# Patient Record
Sex: Male | Born: 1955 | Race: White | Hispanic: No | Smoking: Never smoker
Health system: Southern US, Community
[De-identification: ages and names within clinical notes are randomized; demographics above are authoritative.]

## PROBLEM LIST (undated history)

## (undated) ENCOUNTER — Emergency Department (HOSPITAL_COMMUNITY): Admission: EM | Payer: BC Managed Care – PPO | Source: Home / Self Care

## (undated) DIAGNOSIS — G8929 Other chronic pain: Secondary | ICD-10-CM

## (undated) DIAGNOSIS — K589 Irritable bowel syndrome without diarrhea: Secondary | ICD-10-CM

## (undated) DIAGNOSIS — M26629 Arthralgia of temporomandibular joint, unspecified side: Secondary | ICD-10-CM

## (undated) DIAGNOSIS — N411 Chronic prostatitis: Secondary | ICD-10-CM

## (undated) DIAGNOSIS — N522 Drug-induced erectile dysfunction: Secondary | ICD-10-CM

## (undated) DIAGNOSIS — N200 Calculus of kidney: Secondary | ICD-10-CM

## (undated) DIAGNOSIS — G4733 Obstructive sleep apnea (adult) (pediatric): Secondary | ICD-10-CM

## (undated) DIAGNOSIS — F1921 Other psychoactive substance dependence, in remission: Secondary | ICD-10-CM

## (undated) DIAGNOSIS — K219 Gastro-esophageal reflux disease without esophagitis: Secondary | ICD-10-CM

## (undated) DIAGNOSIS — I509 Heart failure, unspecified: Secondary | ICD-10-CM

## (undated) DIAGNOSIS — I447 Left bundle-branch block, unspecified: Secondary | ICD-10-CM

## (undated) DIAGNOSIS — G4730 Sleep apnea, unspecified: Secondary | ICD-10-CM

## (undated) DIAGNOSIS — I2510 Atherosclerotic heart disease of native coronary artery without angina pectoris: Secondary | ICD-10-CM

## (undated) DIAGNOSIS — M549 Dorsalgia, unspecified: Secondary | ICD-10-CM

---

## 2011-12-03 ENCOUNTER — Encounter (HOSPITAL_COMMUNITY): Payer: Self-pay | Admitting: *Deleted

## 2011-12-03 ENCOUNTER — Ambulatory Visit (HOSPITAL_COMMUNITY)
Admission: RE | Admit: 2011-12-03 | Discharge: 2011-12-03 | Disposition: A | Discharge status: Home / Self Care | Payer: No Typology Code available for payment source | Source: Ambulatory Visit | Attending: Psychiatry | Admitting: Psychiatry

## 2011-12-03 DIAGNOSIS — M26629 Arthralgia of temporomandibular joint, unspecified side: Secondary | ICD-10-CM

## 2011-12-03 DIAGNOSIS — M549 Dorsalgia, unspecified: Secondary | ICD-10-CM | POA: Diagnosis present

## 2011-12-03 DIAGNOSIS — F1320 Sedative, hypnotic or anxiolytic dependence, uncomplicated: Secondary | ICD-10-CM | POA: Insufficient documentation

## 2011-12-03 DIAGNOSIS — G8929 Other chronic pain: Secondary | ICD-10-CM | POA: Diagnosis present

## 2011-12-03 DIAGNOSIS — N522 Drug-induced erectile dysfunction: Secondary | ICD-10-CM

## 2011-12-03 DIAGNOSIS — G4730 Sleep apnea, unspecified: Secondary | ICD-10-CM

## 2011-12-03 DIAGNOSIS — N529 Male erectile dysfunction, unspecified: Secondary | ICD-10-CM | POA: Insufficient documentation

## 2011-12-03 DIAGNOSIS — F1120 Opioid dependence, uncomplicated: Secondary | ICD-10-CM | POA: Insufficient documentation

## 2011-12-03 DIAGNOSIS — F411 Generalized anxiety disorder: Secondary | ICD-10-CM | POA: Insufficient documentation

## 2011-12-03 DIAGNOSIS — F39 Unspecified mood [affective] disorder: Secondary | ICD-10-CM | POA: Insufficient documentation

## 2011-12-03 DIAGNOSIS — M26609 Unspecified temporomandibular joint disorder, unspecified side: Secondary | ICD-10-CM | POA: Insufficient documentation

## 2011-12-03 HISTORY — DX: Sleep apnea, unspecified: G47.30

## 2011-12-03 HISTORY — DX: Arthralgia of temporomandibular joint, unspecified side: M26.629

## 2011-12-03 HISTORY — DX: Other chronic pain: G89.29

## 2011-12-03 HISTORY — DX: Dorsalgia, unspecified: M54.9

## 2011-12-03 HISTORY — DX: Drug-induced erectile dysfunction: N52.2

## 2011-12-03 NOTE — BH Assessment (Signed)
Assessment Note   Alejandro Ramos is a 56 y.o. married white male.  He presents at the recommendation of the [place] [facility], from which he was discharged on 12/01/2011.  Pt was admitted to this facility for a 45 day residential rehab program after a 25 day inpatient program at [facility], where he received detox and rehab treatment.  Pt reports that he has been taking 100 mg of Ambien daily for the past 1.5 years.  In the last 2 months before treatment he was also taking 2 tabs of Vicodin daily.  Both medications were obtained on the Internet.  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  Pt attributes his relapse in part to ongoing conflict with his spouse; he characterizes their relationship as co-dependent.  He notes that despite an extensive history of participation in 12-step meetings, he stopped because she pressured him to do so.  He also reports that in 07/2011 an uncle to whom he was very close perished, and that his father is currently very ill and is expected to die soon.  Pt reports many years of problems with depression (see "risk to self" assessment below) and anxiety.  He reports that his sleep has been diminished since stopping the medications about 60 days ago.  He has panic attacks when he lays down, and only sleeps 4 - 5 hours, with terminal insomnia.  He denies SI at this time and has never made a suicide attempt.  He reports that 30 years ago he experienced SI and wrote a suicide note, but did not actually harm himself.  He denies HI, violent behavior, legal problems, or hallucinations, and exhibits no delusional thought.  He is interested in enrolling in CD-IOP program.  Pt and his wife and children live in the [place], [place] area, but he is currently living with his sister's family in [place] as he completes his rehabilitation.  He will be returning to his own home thereafter.  He  is a retired Publishing copy.  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 Back pain, chronic 12/03/2011  . Sleep apnea 12/03/2011    Has a C-PAP machine, but does not use it.  . 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 Back pain, chronic 12/03/2011  . Sleep apnea 12/03/2011    Has a C-PAP machine, but does not use it.  . 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 Drug-induced erectile dysfunction 12/03/2011    Induce by antidepressants    No past surgical history on file.  Family History: No family history on file.  Social History:  does not have a smoking history on file. He does not have any smokeless tobacco history on file. He reports that he uses illicit drugs (Hydrocodone and Other-see comments). His alcohol history not on file.  Additional Social History:  Alcohol / Drug Use Pain Medications: Vicodin (obtained via Internet) Prescriptions: Denies Over the Counter: Denies Substance #1 Name of Substance 1: Ambien (obtained via Internet) 1 - Age of First Use: 56 y/o 1 - Amount (  size/oz): 100 mg 1 - Frequency: Daily 1 - Duration: 1.5 years 1 - Last Use / Amount: 60 days ago Substance #2 Name of Substance 2:  Vicodin (obtained via Internet) 2 - Age of First Use: 20's 2 - Amount (size/oz): 2 tabs 2 - Frequency: daily 2 - Duration: 2 months 2 - Last Use / Amount: 60 days ago  CIWA:   COWS:    Allergies: Allergies no known allergies  Home Medications:  (Not in a hospital admission)  OB/GYN Status:  No LMP for male patient.  General Assessment Data Location of Assessment:  [facility] Living Arrangements: Spouse/significant other;Children (Children ages 56, 6, & 41 y/o; currently staying w/ sister.) Can pt return to current living arrangement?: Yes Admission Status: Voluntary Is patient capable of signing voluntary admission?: Yes Transfer from: Home Referral Source: Self/Family/Friend  Education Status Is patient currently in school?: No Current Grade: Holds a graduate degree  Risk to self Suicidal Ideation: No Suicidal Intent: No Is patient at risk for suicide?: No Suicidal Plan?: No Access to Means: No What has been your use of drugs/alcohol within the last 12 months?: Recently detoxed from Ambien & Vicodin Previous Attempts/Gestures: No How many times?: 0  Other Self Harm Risks: History of SI 30 yrs ago; wrote note, no gesture. Triggers for Past Attempts: Other (Comment) (Not applicable) Intentional Self Injurious Behavior: None Family Suicide History: No (Hx of alcoholism in family.) Recent stressful life event(s): Loss (Comment) (Spouse pressured him to discontinue 12-step; Father very ill) Persecutory voices/beliefs?: No Depression: Yes Depression Symptoms: Insomnia;Isolating;Fatigue;Guilt;Loss of interest in usual pleasures;Feeling angry/irritable Substance abuse history and/or treatment for substance abuse?: Yes (Recent Tx of abusing Ambien & Vicodin) Suicide prevention information given to non-admitted patients: Yes  Risk to Others Homicidal Ideation: No Thoughts of Harm to Others: No Current Homicidal Intent: No Current Homicidal Plan: No Access to Homicidal Means: No Identified Victim: None History of harm to others?: No Assessment of Violence: None Noted Violent Behavior Description: Calm, cooperative Does patient have access to weapons?: No (Denies having access to firearms) Criminal Charges Pending?: No Does patient have a court date: No  Psychosis Hallucinations: None noted Delusions: None noted  Mental  Status Report Appear/Hygiene: Other (Comment) (Neat, well groomed) Eye Contact: Fair Motor Activity: Unremarkable Speech: Other (Comment) (Unremarkable) Level of Consciousness: Alert Mood: Anxious;Depressed Affect: Appropriate to circumstance Anxiety Level: Moderate (Daily panic attacks when laying down.) Thought Processes: Coherent;Circumstantial Judgement: Unimpaired Orientation: Person;Place;Time;Situation Obsessive Compulsive Thoughts/Behaviors: Minimal (Repetative behavior)  Cognitive Functioning Concentration: Decreased (When he sleeps poorly) Memory: Recent Intact;Remote Intact IQ: Above Average Insight: Fair Impulse Control: Good Appetite: Fair Weight Loss: 25  (over past 60 days) Weight Gain: 0  Sleep: Decreased Total Hours of Sleep: 4  (Terminal insomnia, x 60 days since stopping Ambien) Vegetative Symptoms: None  ADLScreening [facility] Assessment Services) Patient's cognitive ability adequate to safely complete daily activities?: Yes Patient able to express need for assistance with ADLs?: Yes Independently performs ADLs?: Yes  Abuse/Neglect [facility]) Physical Abuse: Denies Verbal Abuse: Denies Sexual Abuse: Denies  Prior Inpatient Therapy Prior Inpatient Therapy: Yes Prior Therapy Dates: 08/2011 - 09/2011: [facility]; 25 day detox/rehab program. Prior Therapy Facilty/Provider(s): 09/2011 - 12/01/2011: [facility] [place] [facility]; 45 day residential rehab Reason for Treatment: 5 years ago: [facility]; detox/rehab  Prior Outpatient Therapy Prior Outpatient Therapy: Yes Prior Therapy Dates: 5-6 years ago w/ restart today: [name], [place] - outpatient counseling Prior Therapy Facilty/Provider(s): Intake scheduled for  12/29/2011 w/ Rupinder  [name], MD Reason for Treatment: Extensive history of 12-step participation.  ADL Screening (condition at time of admission) Patient's cognitive ability adequate to safely complete daily activities?: Yes Patient  able to express need for assistance with ADLs?: Yes Independently performs ADLs?: Yes Weakness of Legs: None Weakness of Arms/Hands: None  Home Assistive Devices/Equipment Home Assistive Devices/Equipment: CPAP;Eyeglasses (Oral insert for TMJ; Does not use CPAP)    Abuse/Neglect Assessment (Assessment to be complete while patient is alone) Physical Abuse: Denies Verbal Abuse: Denies Sexual Abuse: Denies Exploitation of patient/patient's resources: Denies Self-Neglect: Denies Values / Beliefs Cultural Requests During Hospitalization:  (Extensive history of 12-step participation.)   Advance Directives (For Healthcare) Advance Directive: Patient does not have advance directive;Patient would not like information Pre-existing out of facility DNR order (yellow form or pink MOST form): No Nutrition Screen Diet: Regular Unintentional weight loss greater than 10lbs within the last month: Yes (Comment) (Reports 25# weight loss in the past 60 days) Problems chewing or swallowing foods and/or liquids: No Home Tube Feeding or Total Parenteral Nutrition (TPN): No Patient appears severely malnourished: No  Additional Information 1:1 In Past 12 Months?: No CIRT Risk: No Elopement Risk: No Does patient have medical clearance?: No     Disposition:  Disposition Disposition of Patient: Outpatient treatment Type of outpatient treatment: Chemical Dependence - Intensive Outpatient Discussed pt with Jorje Guild.  He believes that pt would benefit from participation in CD-IOP program, but notes that he will have to discontinue use of Klonopin in order to enroll.  Pt was verbally advised of this, to which he agrees.  He was further advised to go to the local Emergency Dept if he starts to experience withdrawal.  He was given written information about the CD-IOP program, and advised to call Charmian Muff' phone number and leave a message as soon as possible, then await her call back to establish a start date.   He was also advised where the Outpatient Clinic is located, and where to park his car.  He agrees to these plans.  On Site Evaluation by:   Reviewed with Physician:  Jorje Guild, PA @ 16:24   Raphael Gibney 12/03/2011 6:32 PM

## 2011-12-05 ENCOUNTER — Encounter (HOSPITAL_COMMUNITY): Payer: Self-pay | Admitting: Psychology

## 2011-12-05 ENCOUNTER — Other Ambulatory Visit (HOSPITAL_COMMUNITY): Payer: No Typology Code available for payment source | Attending: Psychiatry | Admitting: Psychology

## 2011-12-05 DIAGNOSIS — F1920 Other psychoactive substance dependence, uncomplicated: Secondary | ICD-10-CM | POA: Insufficient documentation
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
## 2011-12-07 ENCOUNTER — Other Ambulatory Visit (HOSPITAL_COMMUNITY): Payer: No Typology Code available for payment source

## 2011-12-07 DIAGNOSIS — F1920 Other psychoactive substance dependence, uncomplicated: Secondary | ICD-10-CM

## 2011-12-08 LAB — PRESCRIPTION ABUSE MONITORING 17P, URINE
Amphetamine/Meth: NEGATIVE ng/mL
Barbiturate Screen, Urine: NEGATIVE ng/mL
Buprenorphine, Urine: NEGATIVE ng/mL
Cannabinoid Scrn, Ur: NEGATIVE ng/mL
Carisoprodol, Urine: NEGATIVE ng/mL
Cocaine Metabolites: NEGATIVE ng/mL
Fentanyl, Ur: NEGATIVE ng/mL
Meperidine, Ur: NEGATIVE ng/mL
Tapentadol, urine: NEGATIVE ng/mL
Tramadol Scrn, Ur: NEGATIVE ng/mL

## 2011-12-09 ENCOUNTER — Other Ambulatory Visit (HOSPITAL_COMMUNITY): Payer: No Typology Code available for payment source | Admitting: Psychology

## 2011-12-09 ENCOUNTER — Encounter (HOSPITAL_COMMUNITY): Payer: Self-pay | Admitting: Psychology

## 2011-12-09 MED ORDER — CHLORDIAZEPOXIDE HCL 25 MG PO CAPS: ORAL_CAPSULE | ORAL | Status: DC
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
## 2011-12-11 NOTE — Progress Notes (Signed)
    Daily Group Progress Note  Program: CD-IOP   Group Time: 1-2:30 pm  Participation Level: None  Behavioral Response: patient was with the medical director for his first meeting  Type of Therapy: Process Group  Topic: Process: first part of group was spent in process. A new member had returned after a 3 session absence. Upon check-in, she disclosed she had not drank since July 30th, but admitted she had smoked marijuana this morning. She insisted that although the alcohol was a problem, she did not feel that the marijuana was a problem and it was very effective in helping her with anxiety and panic attacks. This disclosure invited a lengthy discussion about the problems that result when an addict uses any chemical. A number of other group members shared that they had tried to eliminate some drug use, but continue to use the marijuana, but it never worked for long and they were always back to their old ways within a short time. The session was very effective in allowing members to witness 'denial' and address their own issues around the future and what they will be able to do about their sobriety.  Group Time: 2:45- 4pm  Participation Level: Minimal  Behavioral Response: Sharing  Type of Therapy: Psycho-education Group  Topic: The Wheel of Life/Graduation: the second part of group was spent with members sharing their wheel of life drawing on the board and explaining where they are in each of the 8 categories. Most wheels were extremely uneven and would have led to very bumpy ride. This exercise required that members share about their lives and opened them up to a new level of understanding within the group. As the session neared an end, a graduation ceremony was held for one of the younger members who was moving onto a new level of recovery. members' shared kind words and encouragement to her and she handed out drawings she had made specifically for each member. It was a joyful celebration,  but one that also recognized that her absence will be felt by all.   Summary: The patient missed most of the first part of group because he was meeting with the program director, AW. In the second part he expressed that he is attending meetings at the [facility] and finds them very helpful and encouraging. He continues to struggle with sleep and stated that the medical director is going to help him with his medications. He wished the graduating member success in working her program and a life of sobriety.    Family Program: Family present? No   Name of family member(s):   UDS collected: Yes Results: positive for benzodiazepines, patient had just stopped taking klonipin   AA/NA attended?: YesTuesday, Wednesday, Thursday and Friday  Sponsor?: No   Agam Davenport, LCAS

## 2011-12-12 ENCOUNTER — Other Ambulatory Visit (HOSPITAL_COMMUNITY): Payer: No Typology Code available for payment source | Admitting: Psychology

## 2011-12-12 DIAGNOSIS — F1920 Other psychoactive substance dependence, uncomplicated: Secondary | ICD-10-CM

## 2011-12-12 LAB — BENZODIAZEPINES (GC/LC/MS), URINE
Alprazolam metabolite (GC/LC/MS), ur confirm: NEGATIVE ng/mL
Flurazepam metabolite (GC/LC/MS), ur confirm: NEGATIVE ng/mL
Midazolam (GC/LC/MS), ur confirm: NEGATIVE ng/mL
Oxazepam (GC/LC/MS), ur confirm: NEGATIVE ng/mL
Temazepam (GC/LC/MS), ur confirm: NEGATIVE ng/mL

## 2011-12-13 LAB — PRESCRIPTION ABUSE MONITORING 17P, URINE
Barbiturate Screen, Urine: NEGATIVE ng/mL
Buprenorphine, Urine: NEGATIVE ng/mL
Cannabinoid Scrn, Ur: NEGATIVE ng/mL
Creatinine, Urine: 190.97 mg/dL (ref >20.0)
Fentanyl, Ur: NEGATIVE ng/mL
MDMA URINE: NEGATIVE ng/mL
Meperidine, Ur: NEGATIVE ng/mL
Methadone Screen, Urine: NEGATIVE ng/mL
Propoxyphene: NEGATIVE ng/mL
Tapentadol, urine: NEGATIVE ng/mL

## 2011-12-13 LAB — ALCOHOL METABOLITE (ETG), URINE: Ethyl Glucuronide (EtG): NEGATIVE ng/mL
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
## 2011-12-14 ENCOUNTER — Other Ambulatory Visit (HOSPITAL_COMMUNITY): Payer: No Typology Code available for payment source | Admitting: Psychology

## 2011-12-14 LAB — BENZODIAZEPINES (GC/LC/MS), URINE
Alprazolam metabolite (GC/LC/MS), ur confirm: NEGATIVE ng/mL
Clonazepam metabolite (GC/LC/MS), ur confirm: 83 ng/mL
Estazolam (GC/LC/MS), ur confirm: NEGATIVE ng/mL
Midazolam (GC/LC/MS), ur confirm: NEGATIVE ng/mL
Nordiazepam (GC/LC/MS), ur confirm: NEGATIVE ng/mL
Temazepam (GC/LC/MS), ur confirm: NEGATIVE ng/mL

## 2011-12-15 ENCOUNTER — Other Ambulatory Visit (HOSPITAL_COMMUNITY): Payer: Self-pay | Admitting: *Deleted

## 2011-12-15 MED ORDER — MIRTAZAPINE 30 MG PO TABS: 30.0000 mg | ORAL_TABLET | Freq: Every day | ORAL | Status: DC

## 2011-12-15 MED ORDER — TRAZODONE HCL 100 MG PO TABS: 200.0000 mg | ORAL_TABLET | Freq: Every day | ORAL | Status: DC

## 2011-12-15 MED ORDER — QUETIAPINE FUMARATE 300 MG PO TABS: 300.0000 mg | ORAL_TABLET | Freq: Every day | ORAL | Status: DC

## 2011-12-15 NOTE — Telephone Encounter (Signed)
Seroquel refill authorized by Dr.Kumar. Trazodone increased from 100 mg to 200 mg and Remeron increased from 15 mg to 30 mg by Dr.Kumar.

## 2011-12-16 ENCOUNTER — Other Ambulatory Visit (HOSPITAL_COMMUNITY): Payer: Self-pay | Admitting: Psychiatry

## 2011-12-16 ENCOUNTER — Other Ambulatory Visit (HOSPITAL_COMMUNITY): Payer: No Typology Code available for payment source | Admitting: Psychology

## 2011-12-16 DIAGNOSIS — F39 Unspecified mood [affective] disorder: Secondary | ICD-10-CM

## 2011-12-16 MED ORDER — TRAZODONE HCL 100 MG PO TABS: 200.0000 mg | ORAL_TABLET | Freq: Every day | ORAL | Status: DC

## 2011-12-16 MED ORDER — QUETIAPINE FUMARATE 300 MG PO TABS: 300.0000 mg | ORAL_TABLET | Freq: Every day | ORAL | Status: DC

## 2011-12-16 MED ORDER — MIRTAZAPINE 30 MG PO TABS: 30.0000 mg | ORAL_TABLET | Freq: Every day | ORAL | Status: DC

## 2011-12-18 NOTE — Progress Notes (Signed)
    Daily Group Progress Note  Program: CD-IOP   Group Time: 1-2:30 pm  Participation Level: Active  Behavioral Response: Appropriate  Type of Therapy: Psycho-education Group  Topic: What are Your Mooring Lines? A presentation was provided identifying the essential need to develop mooring lines to keep on anchored and steady in recovery. An analogy to a boat or ship that sets out mooring lines to remain anchored. Without them the boat will drift and possibly encounter danger or damage by drifting aimlessly. Early recovery is the same way. If one doesn't have these support lines in place, the risks of relapse become greater and more likely. Handouts were provided with members identifying their mooring lines. Most had a few, but only 1 or 2 group members have built the support and established these mooring lines to support their early sobriety.   Group Time: 2:45-4pm  Participation Level: Active  Behavioral Response: Sharing and Assertive  Type of Therapy: Process Group  Topic: Process: second half of group was spent in process. Members shared about their difficulties and struggles. One member shared about his situation at home and the growing chaos around his marriage and wife's behaviors. Members listened and provided support, but everyone was clearly frustrated by a bad situation getting worse and the few options available to this member. There was denial observed by one member who discounts her cannabis use while others encouraged their fellow group members to attend meetings and reach out for support.  Summary: The patient had more mooring lines than any other member and noted besides his sponsors and other men in recovery, he has his 2 siblings and is working with a Veterinary surgeon as well as this program. The patient displayed a good understanding of the importance of being accountable to others in early recovery and has many mooring lines to help anchor him. He agreed that his home life is  somewhat like another group member's because their wives don't seem to understand addiction and are not supportive of the 12-step community or connecting with others that are in recovery. The patient is making excellent progress in his recovery, but remains very discombobulated cognitively and agrees he is very raw in his sobriety from Ambien. He was very supportive of his fellow group members and assertive around another member who is in denial about her cannabis use. He made some good comments.    Family Program: Family present? No   Name of family member(s):   UDS collected: No Results:   AA/NA attended?: YesMonday, Tuesday, Wednesday, Thursday, Friday, Saturday and Sunday  Sponsor?: Yes   Micaella Gitto, LCAS

## 2011-12-18 NOTE — Progress Notes (Signed)
    Daily Group Progress Note  Program: CD-IOP   Group Time: 1-2:30 pm  Participation Level: Active  Behavioral Response: Appropriate  Type of Therapy: Psycho-education Group  Topic: The 12-Step community and its importance in early recovery: first half of group was spent in a session with information being provided about the origin of Alcoholics Anonymous and the various aspects of the 12-step recovery movement. A brief scene from "My Name is Francisco Capuchin" was shown and the group saw the first time Dr. Nadine Counts and Francisco Capuchin met. Handouts were provided detailing the different types of meetings as well as specifics about sponsorship and how one might go about securing a sponsor. Group members provided their own feedback and encouraged those members who have not attended any meetings to please go. There was good discussion and disclosure about meetings and how they were helpful in providing ongoing support for sobriety.   Group Time: 2:45- 4pm  Participation Level: Active  Behavioral Response: Sharing and Assertive  Type of Therapy: Process Group  Topic: Group Process and Introductions: second half of group was spent in process. There were 2 new group members and they shared about their own addiction, what led them to treatment, and what they hope to get out of this program. There was good feedback and the commonalities among members did not go unnoticed. All of the members present agreed that they would attend a 12-step meeting between today and the next group session on Monday.   Summary: the patient reported that his AA meetings are a wonderful source of support for him in recovery and he attends 2-4 meetings daily when he is not in these group sessions. He reported he has 2 sponsors and is working the steps. He reported in addition to the 12-step community he meets with a sponsor on an individual basis and has found all of his treatment very helpful. He provided good support and feedback to his  fellow group members and was very encouraging of the 12-step community and how they will be there forever if one just reaches out. He made some excellent comments.    Family Program: Family present? No   Name of family member(s):   UDS collected: No Results:   AA/NA attended?: YesMonday, Tuesday, Wednesday, Thursday, Friday, Saturday and Sunday  Sponsor?: Yes   Camera Krienke, LCAS

## 2011-12-19 ENCOUNTER — Other Ambulatory Visit (HOSPITAL_COMMUNITY): Payer: No Typology Code available for payment source | Admitting: Psychology

## 2011-12-20 NOTE — Progress Notes (Signed)
    Daily Group Progress Note  Program: CD-IOP   Group Time: 1-2:30 pm  Participation Level: Active  Behavioral Response: Appropriate  Type of Therapy: Psycho-education Group  Topic: Loss and Grief: A guest facilitator was present for group today and introduced herself and provided a little bit of her history. Following the check-in, a psycho-educational piece was provided focusing on the role that grief and loss can play in recovery. Members discussed how their pain and grief has led to drinking and drugging. A discussion ensued about ways members might address their grief or, if necessary, distract one's self away from the pain for a while. Everyone in the group seemed to be dealing with some sort of loss and the topic was very pertinent to recovery.   Group Time: 2:45- 4pm  Participation Level: Active  Behavioral Response: Sharing  Type of Therapy: Process Group  Topic: Process: second half of group was spent in process. Members discussed current issues in their lives and stressors that are proving difficult to deal with. There were 2 new group members and they introduced themselves briefly. There was good disclosure and feedback among the group. Per their reports, all group members remained alcohol and drug-free over the weekend.   Summary: Patient reported maintaining sobriety over the weekend. The patient focused on loss of relationship with wife and hope that their relationship can be reconciled. He was confronted by group about his excessive focus on wife's behavior and that he can only control his responses to her behavior. Patient shared that he is experiencing anxiety regarding marital/financial/legal issues. He left group early due to appointment with his attorney.   Family Program: Family present? No   Name of family member(s):   UDS collected: No Results:   AA/NA attended?: YesMonday, Tuesday, Wednesday, Thursday, Friday, Saturday and Sunday  Sponsor?:  Yes   Pattye Meda, LCAS

## 2011-12-21 ENCOUNTER — Other Ambulatory Visit (HOSPITAL_COMMUNITY): Payer: No Typology Code available for payment source | Admitting: Psychology

## 2011-12-21 DIAGNOSIS — F1920 Other psychoactive substance dependence, uncomplicated: Secondary | ICD-10-CM

## 2011-12-22 LAB — PRESCRIPTION ABUSE MONITORING 17P, URINE
Barbiturate Screen, Urine: NEGATIVE ng/mL
Cannabinoid Scrn, Ur: NEGATIVE ng/mL
Carisoprodol, Urine: NEGATIVE ng/mL
Cocaine Metabolites: NEGATIVE ng/mL
Fentanyl, Ur: NEGATIVE ng/mL
MDMA URINE: NEGATIVE ng/mL
Meperidine, Ur: NEGATIVE ng/mL
Tapentadol, urine: NEGATIVE ng/mL
Tramadol Scrn, Ur: NEGATIVE ng/mL
Zolpidem, Urine: NEGATIVE ng/mL

## 2011-12-22 NOTE — Progress Notes (Signed)
    Daily Group Progress Note  Program: CD-IOP   Group Time: 1-2:30 pm  Participation Level: Active  Behavioral Response: Appropriate  Type of Therapy: Activity Group  Topic: Stress, recognizing it in your body, and learning to relax. First part of group was spent in a guided progressive relaxation session. The lights were dimmed and members sat comfortably as I read a progressive relaxation exercise. After the exercise concluded, members were asked to share their experiences of the guided exercise. It was emphasized that for the group members, there will always be some sort of stressors in their lives and, in early recovery, they must be particularly aware of these stressful times. Their external circumstances may not change, but it is our intention that their responses and reactions to those instances will be dramatically different in early recovery. During the check-in, the group applauded 2 members who have recently achieved 30 days of sobriety.  Group Time: 2:45-4pm  Participation Level: Active  Behavioral Response: Sharing  Type of Therapy: Process Group  Topic: Stress, recognizing it in your body, and learning to relax. First part of group was spent in a guided progressive relaxation session. The lights were dimmed and members sat comfortably as I read a progressive relaxation exercise. After the exercise concluded, members were asked to share their experiences of the guided exercise. It was emphasized that for the group members, there will always be some sort of stressors in their lives and, in early recovery, they must be particularly aware of these stressful times. Their external circumstances may not change, but it is our intention that their responses and reactions to those instances will be dramatically different in early recovery. During the check-in, the group applauded 2 members who have recently achieved 30 days of sobriety.  Summary: Stress, recognizing it in your body,  and learning to relax. First part of group was spent in a guided progressive relaxation session. The lights were dimmed and members sat comfortably as I read a progressive relaxation exercise. After the exercise concluded, members were asked to share their experiences of the guided exercise. It was emphasized that for the group members, there will always be some sort of stressors in their lives and, in early recovery, they must be particularly aware of these stressful times. Their external circumstances may not change, but it is our intention that their responses and reactions to those instances will be dramatically different in early recovery. During the check-in, the group applauded 2 members who have recently achieved 30 days of sobriety.  Family Program: Family present? No   Name of family member(s):   UDS collected: Yes Results: not returned  AA/NA attended?: YesMonday, Tuesday, Wednesday, Thursday, Friday, Saturday and Sunday  Sponsor?: Yes   Merelin Human, LCAS

## 2011-12-23 ENCOUNTER — Other Ambulatory Visit (HOSPITAL_COMMUNITY): Payer: No Typology Code available for payment source | Admitting: Psychology

## 2011-12-23 LAB — BENZODIAZEPINES (GC/LC/MS), URINE
Alprazolam metabolite (GC/LC/MS), ur confirm: NEGATIVE ng/mL
Diazepam (GC/LC/MS), ur confirm: NEGATIVE ng/mL
Estazolam (GC/LC/MS), ur confirm: NEGATIVE ng/mL
Flunitrazepam metabolite (GC/LC/MS), ur confirm: NEGATIVE ng/mL
Flurazepam metabolite (GC/LC/MS), ur confirm: NEGATIVE ng/mL
Midazolam (GC/LC/MS), ur confirm: NEGATIVE ng/mL
Oxazepam (GC/LC/MS), ur confirm: 77 ng/mL

## 2011-12-23 LAB — AMPHETAMINES (GC/LC/MS), URINE
MDA GC/MS confirm: NEGATIVE ng/mL
MDMA GC/MS Conf: NEGATIVE ng/mL

## 2011-12-28 ENCOUNTER — Other Ambulatory Visit (HOSPITAL_COMMUNITY): Payer: No Typology Code available for payment source | Attending: Psychiatry | Admitting: Psychology

## 2011-12-28 DIAGNOSIS — F1920 Other psychoactive substance dependence, uncomplicated: Secondary | ICD-10-CM

## 2011-12-28 NOTE — Progress Notes (Signed)
    Daily Group Progress Note  Program: CD-IOP   Group Time: 1-2:30 pm  Participation Level: Active  Behavioral Response: Appropriate  Type of Therapy: Psycho-education Group  Topic: Forgiveness: A presentation was provided on forgiveness and what that includes. The group discussed the need to identify boundaries, acceptance, acknowledgement of the pain associated with the wrong. Included was the recognition of how our barriers to forgiveness of self and others can contribute to relapse. There was good sharing and disclosures among the group. Prior to the break, the therapist led the group in a progressive relaxation exercise.    Group Time: 2:45- 4pm  Participation Level: Active  Behavioral Response: Appropriate and Sharing  Type of Therapy: Process Group  Topic: Process: second half of group spent in process. Members discussed issues and concerns around recovery and everyone maintained that they had remained abstinent. Included in this half of group was a discussion on plans for the upcoming holiday and how members intended to remain sober. While some had very definite plans around 12-steps and sponsors, other members' plans were very uncertain and they were encouraged to make a plan before the weekend arrived.   Summary:Pt. reported maintaining sobriety since Wednesday's group. Participated in progressive muscle relaxation exercise. Group focused on the role of forgiveness of self and others in recovery. Pt. shared need to forgive his wife for her treatment of him during his recovery. Pt. shared that his use had isolated him from others out of fear that he would be judged and rejected.   Family Program: Family present? No   Name of family member(s):   UDS collected: No Results:  AA/NA attended?: YesMonday, Tuesday, Wednesday, Thursday, Friday, Saturday and Sunday  Sponsor?: Yes   Dayleen Beske, LCAS

## 2011-12-29 LAB — PRESCRIPTION ABUSE MONITORING 17P, URINE
Amphetamine/Meth: NEGATIVE ng/mL
Benzodiazepine Screen, Urine: NEGATIVE ng/mL
Cannabinoid Scrn, Ur: NEGATIVE ng/mL
Carisoprodol, Urine: NEGATIVE ng/mL
Cocaine Metabolites: NEGATIVE ng/mL
Meperidine, Ur: NEGATIVE ng/mL
Methadone Screen, Urine: NEGATIVE ng/mL
Opiate Screen, Urine: NEGATIVE ng/mL
Tapentadol, urine: NEGATIVE ng/mL
Tramadol Scrn, Ur: NEGATIVE ng/mL

## 2011-12-30 ENCOUNTER — Other Ambulatory Visit (HOSPITAL_COMMUNITY): Payer: No Typology Code available for payment source | Admitting: Psychology

## 2012-01-01 NOTE — Progress Notes (Signed)
    Daily Group Progress Note  Program: CD-IOP   Group Time: 1-2:30 pm  Participation Level: Active  Behavioral Response: Appropriate  Type of Therapy: Process Group  Topic: Group Process: first half of group was spent in check-in and process. Members shared about the past holiday weekend and 2 members admitted they had used since the last session. After their slips were discussed and reviewed, members talked about their struggles and issues in early recovery. Two new group members were present and they also checked-in. One member was asked to share about his alcohol/drug use and he was very open about his addiction and other health problems. Random drug tests were collected and the session included good disclosure and feedback within the group.  Group Time: 2:45- 4pm  Participation Level: Active  Behavioral Response: Sharing  Type of Therapy: Psycho-education Group  Topic:"Communication: Assertive Communication and what that looks like". Second half of this group session was spent in a psycho educational session focused on assertive communication. The session included a handout and role-playing among group members. The importance of being assertive and getting one's needs met was discussed with members sharing about their own cooperation in assuming a diminished role and acceptable treatment among loved ones. There was good feedback among the group.   Summary: The patient reported he had had a good weekend and attended 2-3 AA meetings at the [facility] every day. He is still not sleeping well and feels very sleep deprived. He expressed frustration about this and noted that lack of sleep makes it harder to handle stress and his growing anxiety. Things remain the same back in [place], [place] where his wife and 3 children reside. His wife continues to send him emails and phone calls that he finds very anxiety-provoking. The patient agreed with another member that being totally honest and  assertive sometimes backfires when people don't care of don't want to hear what you really are thinking and feeling. The patient is doing everything we have asked of him and he is working closely with a sponsor, but his anxiety and sleep issues may take some time to resolve because of the use of ambien and benzos has been so lengthy.    Family Program: Family present? No   Name of family member(s):   UDS collected: Yes Results: negative  AA/NA attended?: YesMonday, Tuesday, Wednesday, Thursday, Friday, Saturday and Sunday  Sponsor?: Yes   Mubashir Mallek, LCAS

## 2012-01-02 ENCOUNTER — Other Ambulatory Visit (HOSPITAL_COMMUNITY): Payer: No Typology Code available for payment source

## 2012-01-04 ENCOUNTER — Other Ambulatory Visit (HOSPITAL_COMMUNITY): Payer: No Typology Code available for payment source | Admitting: Psychology

## 2012-01-04 DIAGNOSIS — F1920 Other psychoactive substance dependence, uncomplicated: Secondary | ICD-10-CM

## 2012-01-04 NOTE — Progress Notes (Signed)
    Daily Group Progress Note  Program: CD-IOP   Group Time: 1-2:30 pm  Participation Level: Active  Behavioral Response: Appropriate  Type of Therapy: Process Group  Topic: Group Process: first part of group was spent in process. There was a new group member present and during this half of group, she shared about her drinking and desires to quit. Members shared their frustrations in early recovery with family, friends and circumstances. The group was reminded that they are responsible for the choices they make. One member was present for his second session and he shared a little about his opiate use and anger about being here. He admitted his parents are making him come and he is also angry because he is not allowed to take his Adderall while in this program. His fellow group members shared their experiences, specifically about how their disease progressed and that they had recognized they couldn't do it themselves. There was good feedback and disclosure among members.  Group Time: 2:45- 4pm  Participation Level: Active  Behavioral Response: Sharing  Type of Therapy: Activity Group  Topic: Activity: in second part of group members were asked to identify 2 people that they admire and to identify 2-3 qualities that they possess for which they are admired. After being provided with a few minutes to consider this assignment, members were asked to share about their answers. Every member shared that they admired a family member or friend. They also identified the characteristics or traits that they admired about them. As the activity progress, I pointed out that these are traits and qualities that each group member values. This exercise invites participants to recognize traits and qualities that they admire and value. This activity aids members in identifying the values they consider important and is used to stimulate reflection and direction towards living out their values.   Summary: The  patient reported he remains active in the meetings at the [facility] include 2-3 meetings per day. Despite his adherence to prescribed medications and experiences, he continues to struggle with sleeplessness and sleep deprivation. The patient reported he is very stressed out with his wife's constant phone calls and emails pushing him in various ways. The patient made some good comments and feedback and is open to other members' struggles despite his own problems.   Family Program: Family present? No   Name of family member(s):   UDS collected: No Results:  AA/NA attended?: YesMonday, Tuesday, Wednesday, Thursday, Friday, Saturday and Sunday  Sponsor?: Yes   Rody Keadle, LCAS

## 2012-01-05 LAB — PRESCRIPTION ABUSE MONITORING 17P, URINE
Buprenorphine, Urine: NEGATIVE ng/mL
Cannabinoid Scrn, Ur: NEGATIVE ng/mL
Cocaine Metabolites: NEGATIVE ng/mL
Creatinine, Urine: 183.5 mg/dL (ref >20.0)
MDMA URINE: NEGATIVE ng/mL
Meperidine, Ur: NEGATIVE ng/mL
Methadone Screen, Urine: NEGATIVE ng/mL
Opiate Screen, Urine: NEGATIVE ng/mL
Tapentadol, urine: NEGATIVE ng/mL
Tramadol Scrn, Ur: NEGATIVE ng/mL
Zolpidem, Urine: NEGATIVE ng/mL

## 2012-01-05 LAB — ALCOHOL METABOLITE (ETG), URINE: Ethyl Glucuronide (EtG): NEGATIVE ng/mL

## 2012-01-05 NOTE — Progress Notes (Signed)
    Daily Group Progress Note  Program: CD-IOP   Group Time: 1-2:30 pm  Participation Level: Active  Behavioral Response: Appropriate  Type of Therapy: Psycho-education Group  Topic: Self-Esteem: How Negative Core Beliefs Develop and are Maintained. A presentation was provided examining the development of one's self-esteem and how negative core beliefs can develop. Handouts were provided to group members. Self-esteem is formed in childhood and is influenced by many different experiences one has in her/his world. The formation of negative core beliefs and subsequent development of rules and behaviors to protect one from the activation of those beliefs was charted on the board. Members shared about their early experiences and the beliefs about themselves that developed as results of those experiences. The patterns of behaviors that many group members have followed based on those core beliefs were highlighted and made perfect sense to those reporting them. The session proved enlightening to many. Friday's group session will focus on how one can begin to change those core beliefs.   Group Time: 2:45-4 pm Participation Level: Active  Behavioral Response: Sharing  Type of Therapy: Process Group  Topic: Group Process: second half of group was spent in process. Two members admitted they had relapsed with one member explaining she had lied about it all week and had felt very guilty keeping the truth from the group. The other member seemed very discouraged and questioned whether she would ever stop using. Her disclosures included childhood experiences that served to form her negative core beliefs about self-worth and value. There was good disclosure among group members with very affirming feedback to those struggling.   Summary: the patient was attentive and confirmed that his home life growing up was chaotic. Zacary noted that he has come to realize that his mother was also alcoholic. In process, he  encouraged the group member who continues to use to seek residential treatment. He agreed with many of the other group members who reported that they could not have stopped using on their own and most had to go into treatment. The patient reported he slept better last night. He continues to make excellent progress and is attending 2-3 meetings per day and working with a sponsor. He made some good comments.    Family Program: Family present? No   Name of family member(s):   UDS collected: Yes Results: negative  AA/NA attended?: YesMonday, Tuesday, Wednesday, Thursday, Friday, Saturday and Sunday  Sponsor?: Yes   Paras Kreider, LCAS

## 2012-01-06 ENCOUNTER — Other Ambulatory Visit (HOSPITAL_COMMUNITY): Payer: No Typology Code available for payment source | Admitting: Psychology

## 2012-01-08 NOTE — Progress Notes (Signed)
    Daily Group Progress Note  Program: CD-IOP   Group Time: 1-2:30 pm  Participation Level: Active  Behavioral Response: Appropriate  Type of Therapy: Psycho-education Group  Topic:Positive Qualities Record: Beginning to Challenge the Negative Core Belief: first half of group spent in presentation about challenging one's negative core beliefs. The first step requires one identifying their "positive qualities". Group members were provided with a handout that included 12 different questions which required members to identify positive aspects of themselves. Members confirmed that they understood the development of negative core beliefs, but were eager to learn how to begin to change that. There was good disclosure and conversation among the group and all were able to identify positive qualities and characteristics that they possess.   Group Time: 2:45- 4pm  Participation Level: Active  Behavioral Response: Sharing  Type of Therapy: Process Group  Topic: Group Process/Graduation: the second half of group was spent in process. Members talked about their current frustrations and difficulties in early recovery. There was good feedback and sharing about how they have dealt with these commonly held experiences. As the session came to an end, a graduation ceremony was held for a successfully graduating member. There were brownies and kind words about the graduating member. The session proved very effective for all present.  Summary: The patient reported he had slept well over the last 2 nights and feels very good about that. He was able to identify positive qualities, including being helpful, attentive, and having a good sense of humor. He reiterated during this session and my presentation, that these things are very slow to change and are long-term processes. The patient shared some kind words to the graduating member and provided good feedback to his fellow group members. He continues to make  excellent progress in his recovery and with some much needed sleep, he may dramatically increase his progress in recovery.     Family Program: Family present? No   Name of family member(s):   UDS collected: No Results:   AA/NA attended?: YesMonday, Tuesday, Wednesday, Thursday, Friday, Saturday and Sunday  Sponsor?: Yes   Markesha Hannig, LCAS

## 2012-01-09 ENCOUNTER — Other Ambulatory Visit (HOSPITAL_COMMUNITY): Payer: No Typology Code available for payment source | Admitting: Psychology

## 2012-01-11 ENCOUNTER — Other Ambulatory Visit (HOSPITAL_COMMUNITY): Payer: No Typology Code available for payment source | Admitting: Psychology

## 2012-01-11 DIAGNOSIS — F1920 Other psychoactive substance dependence, uncomplicated: Secondary | ICD-10-CM

## 2012-01-11 NOTE — Progress Notes (Signed)
    Daily Group Progress Note  Program: CD-IOP   Group Time: 1-2:30 pm  Participation Level: Active  Behavioral Response: Appropriate  Type of Therapy: Process Group  Topic:  Group Process: first half of group was spent in process. Members recounted the events of the past weekend and described things they had done to support their recovery. While a few members attended numerous meetings, others had not gone to any 12-step meetings or done anything of significance focusing on their new life of sobriety. The chronic and progressive nature of this disease was reiterated and members encouraged to make a strong commitment to abstinence and doing something at least once every day. Random drug tests were collected from 3 group members.  Group Time: 2:45- 4pm  Participation Level: Active  Behavioral Response: Sharing  Type of Therapy: Psycho-education Group  Topic: Trigger, Thought, Cravings, and Use: the relapse process and how to disrupt it. Second half of group was spent reviewing the relapse process and how one must interrupt this path towards relapse. One member had relapses last weekend and the process of her relapse mapped on the board. Members identified where she could have stopped this process and remains sober instead of smoking crack. The importance of "telling on yourself" was emphasized with members giving their own examples from the past. There was good disclosure among the group and positive feedback.  Summary: The patient reported he had gone to 5 meetings over the weekend at the [employer]. He noted that the fish fry was a big success and many in recovery, as well as neighbors of the meeting place, had come. It was hosted by the [employer]. He reported he has been sleeping and feels much better. He shared his views on the member's recent relapse and pointed out where she had gone wrong and how she could have done things differently. He made some good comments.    Family  Program: Family present? No   Name of family member(s):   UDS collected: No Results:   AA/NA attended?: YesMonday, Tuesday, Wednesday, Thursday, Friday, Saturday and Sunday  Sponsor?: Yes   Lainey Nelson, LCAS

## 2012-01-12 LAB — PRESCRIPTION ABUSE MONITORING 17P, URINE
Buprenorphine, Urine: NEGATIVE ng/mL
Creatinine, Urine: 141.48 mg/dL (ref >20.0)
MDMA URINE: NEGATIVE ng/mL
Methadone Screen, Urine: NEGATIVE ng/mL
Opiate Screen, Urine: NEGATIVE ng/mL
Oxycodone Screen, Ur: NEGATIVE ng/mL
Propoxyphene: NEGATIVE ng/mL
Zolpidem, Urine: NEGATIVE ng/mL

## 2012-01-12 LAB — ALCOHOL METABOLITE (ETG), URINE: Ethyl Glucuronide (EtG): NEGATIVE ng/mL

## 2012-01-12 NOTE — Progress Notes (Signed)
    Daily Group Progress Note  Program: CD-IOP   Group Time: 1-2:30 pm  Participation Level: Active  Behavioral Response: Appropriate and Sharing  Type of Therapy: Psycho-education Group  Topic: Group Process: members continued to discuss the information gained from the film shown earlier in the session. Members talked about their experiences when they first began using alcohol and drugs and many of these descriptions matched the information provided in the film. I emphasized that they were not responsible for having an addiction, but that they are responsible for learning what they need to know to take care of their disease and what they must do to keep it in "remission".   Group Time: 2:45-4pm  Participation Level: Active  Behavioral Response: Sharing  Type of Therapy: Process Group  Topic:Group Process: members continued to discuss the information gained from the film shown earlier in the session. Members talked about their experiences when they first began using alcohol and drugs and many of these descriptions matched the information provided in the film. I emphasized that they were not responsible for having an addiction, but that they are responsible for learning what they need to know to take care of their disease and what they must do to keep it in "remission".     Summary: The patient was very attentive and reported he found the film very informative. He held up his hand as the speaker identified those addicted to benzos or sleeping meds as lacking in GABA. He pointed out that he was now taking gagapentin (neurontin) and has slept for the last 8 days. The group applauded this news. He also reported that the speaker had stated that sleep deprivation is one of the biggest causes of relapse, which he has struggle with for years. The patient reported he is doing better and feels relieved that he is sleeping. He made some excellent comments and provided good feedback to his fellow  group members. His sobriety date remains May 23rd.    Family Program: Family present? No   Name of family member(s):   UDS collected: Yes Results: negative  AA/NA attended?: YesMonday, Tuesday, Wednesday, Thursday, Friday, Saturday and Sunday  Sponsor?: Yes   Shiquan Mathieu, LCAS

## 2012-01-13 ENCOUNTER — Other Ambulatory Visit (HOSPITAL_COMMUNITY): Payer: No Typology Code available for payment source | Admitting: Psychology

## 2012-01-16 ENCOUNTER — Other Ambulatory Visit (HOSPITAL_COMMUNITY): Payer: No Typology Code available for payment source | Admitting: Psychology

## 2012-01-16 DIAGNOSIS — F1920 Other psychoactive substance dependence, uncomplicated: Secondary | ICD-10-CM

## 2012-01-17 LAB — PRESCRIPTION ABUSE MONITORING 17P, URINE
Barbiturate Screen, Urine: NEGATIVE ng/mL
Benzodiazepine Screen, Urine: NEGATIVE ng/mL
Buprenorphine, Urine: NEGATIVE ng/mL
Creatinine, Urine: 210.11 mg/dL (ref >20.0)
Fentanyl, Ur: NEGATIVE ng/mL
MDMA URINE: NEGATIVE ng/mL
Meperidine, Ur: NEGATIVE ng/mL
Oxycodone Screen, Ur: NEGATIVE ng/mL
Propoxyphene: NEGATIVE ng/mL
Zolpidem, Urine: NEGATIVE ng/mL

## 2012-01-17 LAB — ALCOHOL METABOLITE (ETG), URINE: Ethyl Glucuronide (EtG): NEGATIVE ng/mL

## 2012-01-17 NOTE — Progress Notes (Signed)
    Daily Group Progress Note  Program: CD-IOP   Group Time: 1-2:30 pm  Participation Level: Active  Behavioral Response: Appropriate  Type of Therapy: Psycho-education Group  Topic: Chief Operating Officer and the Recovery Pie: first half of group spent checking-in. members shared about their recovery activities over the past weekend. While some members had attended 5 or 6 AA/NA meetings, some had not attended any meetings. It seemed perfect that the group had a guest speaker today. She had graduated successfully from the program and had attained over 8 months of sobriety. She shared about her own recovery program and what she has done to insure sobriety. The patient emphasized the importance of the 12-step community for her sobriety and reported she attends around 2 meetings per day. She calls her sponsor every day and frequently meets with others in recovery for lunch or coffee. The importance of her spiritual practice and faith are also very important on a daily basis. There was good feedback among members and a welcoming response to the guest speaker.   Group Time: 2:45- 4pm  Participation Level: Active  Behavioral Response: Sharing and Assertive  Type of Therapy: Process Group  Topic: Group Process: the second half of group was spent in process. Members shared about their current difficulties and struggles in early recovery. One member admitted he was depressed and couldn't seem to get the drive to go to meetings. Another member stated she hated to ask her mother to take her to a meeting. One member admitted that she had been lying about attending meetings and working with her sponsor. She rationalized her use by suggesting she had got into bad habits protecting herself from her mother. None of the other group members seemed surprised by this disclosure. The importance of meeting attendance was reiterated and every member stated they would attend a meeting between today and the next group  session.   Summary: The patient reported he had attended 5 meetings over the weekend. He was surprised to see the guest speaker and welcomed her to the group. They acknowledged having met each other at the [employer]. He spoke highly of her commitment to recovery. In process, the patient reported his wife had been phoning him, including having called him on his son's cell phone and he answered it thinking it was his son. She has been accusing him of trying to avoid paying money to her and other paranoid thoughts. He admitted it was stressful to hear from her, but he was getting better about not letting it get to him as it has in the past. The patient reported he continues to sleep well, which is very helpful and he reminded the group that in the DVD on the Neurobiology of Addiction, the speaker had identified sleep deprivation as a major cause of relapse. The patient agreed that meetings are very important because it takes a long time to really absorb the message, but if you continue to attend meetings you will get the idea and it will become a routine. The patient made some good comments and remains abstinent with a sobriety date of May 23rd.    Family Program: Family present? No   Name of family member(s):   UDS collected: Yes Results:not yet returned  AA/NA attended?: YesMonday, Tuesday, Wednesday, Thursday, Friday, Saturday and Sunday  Sponsor?: Yes   Selassie Spatafore, LCAS

## 2012-01-18 ENCOUNTER — Other Ambulatory Visit (HOSPITAL_COMMUNITY): Payer: No Typology Code available for payment source | Admitting: Psychology

## 2012-01-18 LAB — OPIATES/OPIOIDS (LC/MS-MS)
Hydrocodone: NEGATIVE ng/mL
Hydromorphone: NEGATIVE ng/mL
Norhydrocodone, Ur: NEGATIVE ng/mL
Oxycodone, ur: NEGATIVE ng/mL
Oxymorphone: NEGATIVE ng/mL

## 2012-01-20 ENCOUNTER — Other Ambulatory Visit (HOSPITAL_COMMUNITY): Payer: No Typology Code available for payment source | Admitting: Psychology

## 2012-01-20 NOTE — Progress Notes (Unsigned)
Patient ID: Alejandro Ramos, male   DOB: 02-Mar-1956, 56 y.o.   MRN: [id number]  Subjective: Alejandro Ramos asked to be seen today to review his side effects to Neurontin. He is experiencing some "jerking" muscle movements since being on this high dose of Neurontin. He is sleeping well and he is using other coping mechanisms such as reading and walking to treat his anxiety.  Objective: Well-nourished well-developed white male in no acute distress who presents with a dysphoric mood and constricted affect. He is fully alert and oriented.  Assessment and Plan: We will decrease his Neurontin dose from 600 mg 3 times daily and 1800 mg at bedtime to 300 mg each morning, 600 mg at midday, and 300 mg each evening, and continue 1800 mg at bedtime.
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
## 2012-01-23 ENCOUNTER — Other Ambulatory Visit (HOSPITAL_COMMUNITY): Payer: No Typology Code available for payment source | Admitting: Psychology

## 2012-01-23 NOTE — Progress Notes (Signed)
    Daily Group Progress Note  Program: CD-IOP   Group Time: 1-2:30 pm  Participation Level: Active  Behavioral Response: Appropriate and Sharing  Type of Therapy: Psycho-education Group  Topic: Wheel of Life: first half of group spent in psycho-educational session. Members were provided with handouts to complete on their Wheel of Life. The wheel consisted of 8 segments reflecting 8 aspects of life that are critical in recovery. Members were asked to complete and draw on the board in order to share with their fellow group members. The session required disclosure and explanation and there was good feedback during the session.  Group Time: 2:45- 4pm  Participation Level: Active  Behavioral Response: Sharing  Type of Therapy: Process Group  Topic:Process/Wheel of Life: second half of group included the few members to complete their 'wheel". Upon completion, members shared about current issues and struggles they are experiencing. There was good sharing among the group. As the session ended, members shared their plans for the weekend. Every one of them stated their intention to attend at least one 12-step meeting.   Summary:The patient reported he continues to attend 2-3 meetings per week and feels as if he is making good progress in his recovery. He continues to sleep well through the night. His wheel was pretty smooth with the exception of his S/O, but he has been very open about the problems with his wife. The patient attributed his spiritual growth to the 12-step community. He continues to make good progress and provide good feedback to his fellow group members.   Family Program: Family present? No   Name of family member(s):   UDS collected: No Results:  AA/NA attended?: YesMonday, Tuesday, Wednesday, Thursday, Friday, Saturday and Sunday  Sponsor?: Yes   Deano Tomaszewski, LCAS

## 2012-01-24 NOTE — Progress Notes (Signed)
    Daily Group Progress Note  Program: CD-IOP   Group Time: 1-2:30 pm  Participation Level: Active  Behavioral Response: Appropriate  Type of Therapy: Psycho-education Group  Topic: The Feeling Chart: first part of group was spent in a presentation on "The Feeling Chart". Upon completing the check-in, and in the company of 3 new group members, a handout was provided identifying the progression of substance use, abuse and, finally, addiction. The chart was focused on the way one learns to change one's feelings through the substance use. While one first begins and finds herself able to change her feelings towards euphoria and pleasure, as the use progresses, there is less pleasure and more pain. Members shared their own experiences as their substance use increased and the growing problems they began to have due to the drugs. There was good disclosure and discussion on the topic for today.  Group Time: 2:45- 4pm  Participation Level: Active  Behavioral Response: Sharing  Type of Therapy: Process Group  Topic: Group Process and Introductions: second half of group was spent in process. Members shared about their weekend and engagement in [facility]. Two of the three new group members introduced themselves and described their history of drug use and what had brought them to the program. The group responded with support and encouragement to the new members and this first session proved effective for them.  Summary: The patient reported he had attended 5 meetings over the weekend. He welcomed the new group members and encouraged them to get to some meetings as soon as possible. He described his last few years in addiction as being emotional agony. He had tried to escape and numb himself from his frustrations and difficulties with his wife, but there had been no euphoria. The patient talked about how important it is to have support in place once one graduates from this program. He made some  good comments and provided good feedback to the group.    Family Program: Family present? No   Name of family member(s):   UDS collected: No Results:   AA/NA attended?: YesMonday, Tuesday, Wednesday, Thursday, Friday, Saturday and Sunday  Sponsor?: Yes   Scarlettrose Costilow, LCAS

## 2012-01-25 ENCOUNTER — Other Ambulatory Visit (HOSPITAL_COMMUNITY): Payer: No Typology Code available for payment source | Attending: Psychiatry | Admitting: Psychology

## 2012-01-25 DIAGNOSIS — F1920 Other psychoactive substance dependence, uncomplicated: Secondary | ICD-10-CM | POA: Insufficient documentation

## 2012-01-26 LAB — PRESCRIPTION ABUSE MONITORING 17P, URINE
Amphetamine/Meth: NEGATIVE ng/mL
Benzodiazepine Screen, Urine: NEGATIVE ng/mL
Buprenorphine, Urine: NEGATIVE ng/mL
Cannabinoid Scrn, Ur: NEGATIVE ng/mL
Carisoprodol, Urine: NEGATIVE ng/mL
Cocaine Metabolites: NEGATIVE ng/mL
Meperidine, Ur: NEGATIVE ng/mL
Opiate Screen, Urine: NEGATIVE ng/mL
Tapentadol, urine: NEGATIVE ng/mL
Tramadol Scrn, Ur: NEGATIVE ng/mL

## 2012-01-26 LAB — ALCOHOL METABOLITE (ETG), URINE: Ethyl Glucuronide (EtG): NEGATIVE ng/mL

## 2012-01-27 ENCOUNTER — Other Ambulatory Visit (HOSPITAL_COMMUNITY): Payer: No Typology Code available for payment source | Admitting: Psychology

## 2012-01-27 NOTE — Progress Notes (Signed)
    Daily Group Progress Note  Program: CD-IOP   Group Time: 1-2:30 pm  Participation Level: Active  Behavioral Response: Appropriate  Type of Therapy: Group Therapy  Topic: Feelings: First part of group was spent addressing feelings. A 5 minute breathing exercise was provided, a form of Heart math, and then members were asked to share what they were feeling. While some members were able to identify feelings easily, others found them difficult to identify let alone articulate. I explained that this exercise would be repeated in future sessions in order to teach members to begin to identify their feelings, especially anxiety and tension, and learn how to calm or self-soothe in ways other than drinking or drugging.   Group Time: 2:45- 4pm  Participation Level: Active  Behavioral Response: Sharing  Type of Therapy: Process Group  Topic: Group Process: second half of group was spent in process. Members discussed their current issues and struggles in early recovery,. One member had been absent on Monday and she shared about her relapse on alcohol Friday night. Her right eye was swollen and red and she explained that she had fallen on the concrete. The group reviewed the events that had occurred prior to the actual relapse and members all agreed it was a "set-up". The member reported she had felt embarrassed, but now she knows she can't be around people using. Near the conclusion of the session, a member reported she would be departing, but shared her appreciation to the group for their support and encouragement   Summary: The patient reported he was doing well and had attended a number of meetings since his last group. He admitted that stress and anxiety were big triggers for him. The patient agreed that his wife and her lack of support and understanding of his disease is frustrating for him, but he reminds himself that he can control only himself and no one else. The patient displays excellent  insight and continues to provide encouragement and support to his fellow group members.    Family Program: Family present? No   Name of family member(s):   UDS collected: Yes Results: negative  AA/NA attended?: YesMonday, Tuesday, Wednesday, Thursday, Friday, Saturday and Sunday  Sponsor?: Yes   Gurbani Figge, LCAS

## 2012-01-30 ENCOUNTER — Other Ambulatory Visit (HOSPITAL_COMMUNITY): Payer: No Typology Code available for payment source

## 2012-01-30 NOTE — Progress Notes (Signed)
    Daily Group Progress Note  Program: CD-IOP   Group Time: 1-2:30 pm  Participation Level: Minimal  Behavioral Response: Appropriate and Sharing  Type of Therapy: Process Group  Topic: Group Process/Feelings: first part of group was spent in process. After check-in, the group spent 5 minutes "breathing". upon completing this relaxation exercise, the group discussed current issues in their life. All members had maintained their sobriety dates. One member arrived a few minutes late. She seemed irritated or aggravated. After checking in, she grew more angry after questioning me about a discussion with her case worker? The remainder of this half of group included this group member virtually 'yelling' at me about disclosing information that was not mine to disclose. Group members shared their feelings about the ongoing 'discussion' and invited her to identify what she needed from the group. The session was tense, but very real and the break seemed welcomed by all.  Group Time: 2:45- 4pm  Participation Level: Active  Behavioral Response: Appropriate and Sharing  Type of Therapy: Group Therapy  Topic: Early in the second half of group, the medical director came in to pull this upset group member out and meet with her as is the protocol for Fridays. She insisted she did not need this and was not going to stay. Despite my request that she reconsider, the patient walked out of the group room and out the door. The remainder of the group was spent 'discussing' denial and the importance of 'transparency' in early recovery. The group unanimously reported they had felt that the group member who left was not ready and clearly still in her active addiction. There was good disclosure and the session provided good insight and education for all of the members present.   Summary: The patient reported he was doing well and continues to attend at least 2 meetings per day in addition to this group. He reported  that one of our former group members had recently picked up his 90 day chip. This was good news. The patient observed the interaction between facilitator and member and asked her to tell the group what she needed from them? He remained calm and encouraged her to share what she was feeling. After the break, he agreed with another member that the woman who had walked out was still in her active addiction and she clearly needed to enter treatment. He reminded the group that in most cases, treatment is essential to get a handle on one's addiction and have a chance to remain sober. The patient also admitted that witnessing the interaction between therapist and group member brought back memories of his parents arguing and he had opted to shut down, which was his pattern while growing up in an addictive household.  He made some excellent comments.    Family Program: Family present? No   Name of family member(s):   UDS collected: No Results: AA/NA attended?: YesMonday, Tuesday, Wednesday, Thursday, Friday, Saturday and Sunday  Sponsor?: Yes   Anastasia Tompson, LCAS

## 2012-02-01 ENCOUNTER — Other Ambulatory Visit (HOSPITAL_COMMUNITY): Payer: No Typology Code available for payment source | Admitting: Psychology

## 2012-02-02 NOTE — Progress Notes (Signed)
    Daily Group Progress Note  Program: CD-IOP   Group Time: 1-2:30 pm  Participation Level: Active  Behavioral Response: Appropriate and Sharing  Type of Therapy: Process Group  Topic: Group Process: first part of group was spent in process. Members checked-in and the spent 5 minutes in quiet, just breathing and calming themselves. Upon completion, the group discussed current concerns and issues. One member noted she has struggled with identifying her feelings. Another noted he had tried to sit quietly for 10 minutes without doing anything, as I had assigned to them, but he had failed miserably. Another member shared her conversation with a member who had left abruptly and angrily on Friday and I pointed out she had actually broken confidentiality by telling this other member what had been said in group. This alerted the members to the seeming innocence of breaking anonymity and reminded them of the importance of keeping things shared here in group to themselves.   Group Time: 2:45- 4pm  Participation Level: Active  Behavioral Response: Sharing  Type of Therapy: Psycho-education Group  Topic: A handout was provided entitled, "Letting go.". It included the various ways that letting go doesn't mean not caring, but recognizing that one can't change others and enabling does not benefit anyone. Each group member read a statement from the handout and the group discussed the meaning behind each one. Most of the problems members identify include family and loved ones, but the message behind this session included that one must focus on one's self and let go of trying to control others. There was good disclosure and members shared their growing recognition that they must let go, or surrender, and focus on themselves.   Summary: The patient reported he continues to attend 2-3 meetings per day and has a sponsor. He was very encouraging to another group member and suggested that going into treatment  was the best thing. He explained that he could never have accomplished what he has in sobriety were it not for his residential treatment experience. The patient reported his wife is growing more and more paranoid and he is worried, but can't possibly travel up there to see her. He noted on the handout that he has to let others experiences their own destinies and he can't change his wife and has not more intention to try and change her. The patient continues to make excellent progress in his recovery and this program.    Family Program: Family present? No   Name of family member(s):   UDS collected: No Results:   AA/NA attended?: YesMonday, Tuesday, Wednesday, Thursday, Friday, Saturday and Sunday  Sponsor?: Yes   Alejandro Ramos, LCAS

## 2012-02-03 ENCOUNTER — Other Ambulatory Visit (HOSPITAL_COMMUNITY): Payer: No Typology Code available for payment source

## 2012-02-06 ENCOUNTER — Other Ambulatory Visit (HOSPITAL_COMMUNITY): Payer: No Typology Code available for payment source | Admitting: Psychology

## 2012-02-06 ENCOUNTER — Encounter (HOSPITAL_COMMUNITY): Payer: Self-pay

## 2012-02-06 DIAGNOSIS — F1920 Other psychoactive substance dependence, uncomplicated: Secondary | ICD-10-CM

## 2012-02-07 LAB — PRESCRIPTION ABUSE MONITORING 17P, URINE
Benzodiazepine Screen, Urine: NEGATIVE ng/mL
Buprenorphine, Urine: NEGATIVE ng/mL
Cannabinoid Scrn, Ur: NEGATIVE ng/mL
Cocaine Metabolites: NEGATIVE ng/mL
Creatinine, Urine: 148.85 mg/dL (ref >20.0)
MDMA URINE: NEGATIVE ng/mL
Meperidine, Ur: NEGATIVE ng/mL
Methadone Screen, Urine: NEGATIVE ng/mL
Opiate Screen, Urine: NEGATIVE ng/mL
Tapentadol, urine: NEGATIVE ng/mL
Tramadol Scrn, Ur: NEGATIVE ng/mL
Zolpidem, Urine: NEGATIVE ng/mL

## 2012-02-07 NOTE — Progress Notes (Signed)
    Daily Group Progress Note  Program: CD-IOP   Group Time: 1-2 pm  Participation Level: Active  Behavioral Response: Appropriate and Sharing  Type of Therapy: Process Group  Topic: Group Process: First half of group was spent in group process. Members shared about their weekend and the things they did to support their recovery. No one had relapsed, but a number of members had had thoughts or even cravings about using. In each instance they had managed to distract themselves and focus on something else. Unfortunately, more than half of the group had not attended any meetings and most of the members do not yet have a sponsor. The importance of the 12-step community and Fellowship was emphasized.  Group Time: 2-4 pm  Participation Level: Active  Behavioral Response: Sharing  Type of Therapy: Psycho-education Group  Topic: Chaplain: The second half of group was spent with a vast from the [employer]. The group spent some time introducing themselves and almost every member mentioned some sort of formal religious training or exposure in their childhood. The discussion invited by the chaplain was about Self-Esteem and Self-Concept and whether each of [place] have beliefs about self that are our own or come from others? The group was attentive, but some were quiet for much of the presentation. One member shared bout his childhood and the fears confrontation have in his life as an adult. Other members concurred with this. The session proved informative for many members who admitted they had enjoyed meeting the chaplain and how their image of a chaplain had been fare different than how he had presented.  Summary: The patient reported he had attended 2-3 meetings every day over the weekend. He admitted his wife is becoming more difficulty and demanding when they correspond. He noted that when they speak it just increases his anxiety and stress. He reported it is very difficult but he just  can't let her problems get to him. In the second half of group, the patient shared his fears and frustrations due to his childhood. The patient explained that when there is confrontation or argument, it brings him back to his mother slamming the door and parents arguing. As a result, he just shut down, and took more Ambien, when he and his wife disagreed. They didn't have to get to an argument for him to back down and shut down. The patient pointed out that spirituality is emphasized in the 12-step community. It is a major part of one's recovery program if they engaged in AA/NA. the patient made some excellent comments and continues to do very well in his recovery. Sobriety date remains 5/23.   Family Program: Family present? No   Name of family member(s):   UDS collected: Yes Results: negative  AA/NA attended?: YesMonday, Tuesday, Wednesday, Thursday, Friday, Saturday and Sunday  Sponsor?: Yes   Suellyn Meenan, LCAS

## 2012-02-08 ENCOUNTER — Other Ambulatory Visit (HOSPITAL_COMMUNITY): Payer: No Typology Code available for payment source | Admitting: Psychology

## 2012-02-08 ENCOUNTER — Encounter (HOSPITAL_COMMUNITY): Payer: Self-pay

## 2012-02-09 NOTE — Progress Notes (Signed)
    Daily Group Progress Note  Program: CD-IOP   Group Time: 1-2:30 pm  Participation Level: Active  Behavioral Response: Appropriate  Type of Therapy: Psycho-education Group  Topic: Pharmacist: first half of group was spent with a visit from the pharmacist here at [facility] Peggye Fothergill. She provided a handout on the different categories of drugs and  described the effects these different drugs have on the nervous system. She fielded questions about many of the medications prescribed to address mood disorders and other mental health issues. There was a good discussion and the members expressed that they had learned a lot of new information and the session proved very informative.  Group Time: 2:45- 4pm  Participation Level: Active  Behavioral Response: Appropriate and Sharing  Type of Therapy: Process Group  Topic: Group Process: second half of group was spent in process. Members reviewed elements of the "recovery pie" as 3 new group members were introduced and welcomed to the group. The importance of developing a structure and routine was emphasized. New members were encouraged to attend 12-step meetings and began to build a support network.    Summary: the patient was very attentive and asked good questions of the pharmacist. 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 He admitted that he has struggled with his brain chemistry despite being abstinence since May. He reported in process that his wife continues to cause problems and their interactions just generate more stress and anxiety. He reported he is attending 2-3 meetings daily and emphasized to his fellow group members the importance of meetings and securing a sponsor. The patient reported he continues to get good sleep through the night which he is very grateful for. He continues to provide good support for his fellow group members and is very committed to the  Fellowship within the 12-step community. He made some excellent comments.    Family Program: Family present? No   Name of family member(s):   UDS collected: No Results:   AA/NA attended?: YesMonday, Tuesday, Wednesday, Thursday, Friday, Saturday and Sunday  Sponsor?: Yes   Jerone Cudmore, LCAS

## 2012-02-10 ENCOUNTER — Encounter (HOSPITAL_COMMUNITY): Payer: Self-pay

## 2012-02-10 ENCOUNTER — Other Ambulatory Visit (HOSPITAL_COMMUNITY): Payer: No Typology Code available for payment source | Admitting: Psychology

## 2012-02-13 ENCOUNTER — Other Ambulatory Visit (HOSPITAL_COMMUNITY): Payer: No Typology Code available for payment source | Admitting: Psychology

## 2012-02-13 ENCOUNTER — Encounter (HOSPITAL_COMMUNITY): Payer: Self-pay

## 2012-02-13 NOTE — Progress Notes (Signed)
    Daily Group Progress Note  Program: CD-IOP   Group Time: 1-2:30 pm  Participation Level: Active  Behavioral Response: Appropriate and Sharing  Type of Therapy: Process Group  Topic: Group Process: first half of group was spent in process. Members shared about their current struggles and issues in early recovery. During check-in, no one had relapsed since the previous group session. There was good discussion among the group, especially after one member admitted he had been thinking about drinking tomorrow when his parents are out of town and he is alone at the house. There was good feedback among group members. Also present was a woman who is in the [employer] nursing program and a visitor this afternoon. She asked good questions and made some excellent observations during the session.  Group Time: 2:45- 4pm  Participation Level: Active  Behavioral Response: Appropriate and Sharing  Type of Therapy: Psycho-education Group  Topic: Psycho-Ed: second half of group included handouts and an educational piece on how group works and the responsibilities of each member. The group discussed how members avoid being fully themselves and examples of each were identified by members. The importance of the rules and expectations of the group and each member were carefully identified and discussed. The session proved extremely effective in educating members on the purpose of group therapy and how each of them should come to the session.  Summary: The patient reported he is remains abstinent, but his wife's seemingly growing paranoia is proving very troubling and stress-provoking to him. He shared some of the details of his children's reports about their mother up in [place], [place] where they live. When asked about the kids, he noted that he is corresponding with them via text on a daily basis and he has been speaking with his wife, but only addressing her in ways that will not necessarily stir up conflict or  any fears. The patient provided excellent feedback to his fellow group members. He was very engaged in the second half of group and could very readily identify with some of the problems that patients experience in withholding from group. He continues to make excellent progress and his sobriety date remains 5/23.   Family Program: Family present? No   Name of family member(s):   UDS collected: No Results:   AA/NA attended?: YesMonday, Tuesday, Wednesday, Thursday, Friday, Saturday and Sunday  Sponsor?: Yes   Zalyn Amend, LCAS

## 2012-02-14 NOTE — Progress Notes (Signed)
    Daily Group Progress Note  Program: CD-IOP   Group Time: 1-2:30 pm  Participation Level: Active  Behavioral Response: Appropriate and Sharing  Type of Therapy: Process Group  Topic: Group Process: first part of group was spent in process. Members checked-in with sobriety dates and proceeded to share about the past weekend. Per their reports, no one had relapsed over the weekend. Members shared their current feelings and the difficulties or ease with which they are experiencing early recovery. There was good discussion and feedback among the group.   Group Time: 2:45- 4pm  Participation Level: Active  Behavioral Response: Sharing  Type of Therapy: Psycho-education Group  Topic: Reflecting on the daily reminder: "Today I will trust"./Graduations: second half of group was spent discussing the meaning behind the daily reminder, as read by one of the group members. The reminder emphasized trusting the process and giving up having to know. Members shared their interpretation of the reminder and many felt it emphasized being present in the "now" or moment. As the session neared the end, the graduation ceremony was held for 2 members who were completing the program. Good wishes were given and the celebration observed the brownies. The two departing members shared their intentions going forward; attend 12-step meetings and remain connected and engaged in their daily recovery plans.    Summary: The patient reported he had had a good weekend and attended many AA meetings. He admitted he has continued to struggle with anxiety with his wife who is in [place], [place] with his 3 children. He texts or speaks with his 16 yo daughter daily and she reported her mother seems more paranoid and irritable. He reported he is working closely with his private therapist and is corresponding with his wife sending her very "benign" daily emails to keep in touch with her. The patient agreed with another member that  hanging out in a bar watching the fight on Saturday night would have been a big problem for him and he discouraged the group member who reported this to be very careful. [name] emphasized the importance of the 12-step community, going to meetings and talking with people. He shared some kind words with the graduating members, wished them well and encouraged them to meet him in meetings at the [employer]. The patient continues to display a strong commitment to his sobriety and reports he is sleeping, but his family dynamics and stressors continue to weigh heavily upon him.  Family Program: Family present? No   Name of family member(s):   UDS collected: No Results:   AA/NA attended?: YesMonday, Tuesday, Wednesday, Thursday, Friday, Saturday and Sunday  Sponsor?: Yes   Alejandro Ramos, LCAS

## 2012-02-15 ENCOUNTER — Other Ambulatory Visit (HOSPITAL_COMMUNITY): Payer: No Typology Code available for payment source | Admitting: Psychology

## 2012-02-15 ENCOUNTER — Encounter (HOSPITAL_COMMUNITY): Payer: Self-pay

## 2012-02-16 NOTE — Progress Notes (Signed)
    Daily Group Progress Note  Program: CD-IOP   Group Time: 1-2:30 pm  Participation Level: Active  Behavioral Response: Appropriate and Sharing  Type of Therapy: Process Group  Topic:Group Process: first part of group spent in process. Members checked-in and everyone had remained alcohol and drug-free. The remainder of the session was spent discussing current issues and concerns in early recovery. Members were asked to disclose how they were feeling. There was good disclosure among the group.    Group Time: 2:45- 4pm  Participation Level: Active  Behavioral Response: Sharing  Type of Therapy: Psycho-education Group  Topic: Addiction: The HBO Special: second half of group was spent viewing a brief video clip about addiction and its effects on the brain. The piece came from the HBO special on "Addiction". The piece focused on the effects that drugs have on the dopamine levels in the brain and the way the drugs impact the limbic system. The video proved very informative and generated good discussion among the group.   Summary: The patient reported he is feeling "relaxed, but worried", and explained that he is concerned about his family in [place] and his wife's apparently deterioration into paranoia. He reported that he is flying up next Friday with his brother-in-law so visit and see how things are going. The brother-in-law will provide support and validation while they are with the patient's wife. He reported continued attendance in at least 2 AA meetings per day and working with his sponsor. The patient found the video very informative and helpful in understanding more clearly just what is happening in his brain. He was very supportive of his fellow group members and made some excellent comments. The patient has a very good understanding of his daily recovery needs and has done well in this program.    Family Program: Family present? No   Name of family member(s):   UDS  collected: No Results:   AA/NA attended?: YesMonday, Tuesday, Wednesday, Thursday, Friday, Saturday and Sunday  Sponsor?: Yes   Liberti Appleton, LCAS

## 2012-02-17 ENCOUNTER — Other Ambulatory Visit (HOSPITAL_COMMUNITY): Payer: No Typology Code available for payment source | Admitting: Psychology

## 2012-02-17 ENCOUNTER — Encounter (HOSPITAL_COMMUNITY): Payer: Self-pay

## 2012-02-20 ENCOUNTER — Encounter (HOSPITAL_COMMUNITY): Payer: Self-pay

## 2012-02-20 ENCOUNTER — Other Ambulatory Visit (HOSPITAL_COMMUNITY): Payer: No Typology Code available for payment source | Admitting: Psychology

## 2012-02-21 NOTE — Progress Notes (Signed)
    Daily Group Progress Note  Program: CD-IOP   Group Time: 1-2:30 pm  Participation Level: Active  Behavioral Response: Appropriate and Sharing  Type of Therapy: Process Group  Topic: Group Process: first half of group was spent in process. After check-in, members shared about their current issues and concerns. They were invited to disclose the actions over the past weekend that related or supported their recovery. There was good disclosure and everyone in group had maintained their sobriety.   Group Time: 2:45- 4pm  Participation Level: Active  Behavioral Response: Appropriate  Type of Therapy: Psycho-education Group  Topic: "One Day at a Time". The second half of group was spent in a psycho-education session identifying the importance of the famous clich in [place], "One day at a time". Members discussed their interpretation of this often-repeated phrase and although every disclosure was similar, every one of the interpretations was also different. Later on the session, a brief presentation on the importance of hydrating, diet, exercise, and rest was provided. Each member identified 1 of these 4 aspects of self-care that they would focus more on. The session went well with good discussion among the members.   Summary: The patient reported he had had a busy weekend with AA meetings, walking and talking with his sponsor. He noted he is sleeping okay, but his anxiety is really high right now. The patient explained that he is going to [facility] to visit his wife and children on Thursday. He is being accompanied by his brother-in-law and noted they would stay in a hotel. He reported he wanted to see his daughter in a play on Friday and check out how things were with the kids and how his wife seems. She has sounded very paranoid and the children also report her growing paranoia. He reported he probably needs to drink more water and will work on his hydration levels. Otherwise, he is getting good  rest and is walking, sometimes twice per day. The patient continues to make excellent progress in his recovery and is very strongly engaged in the 12-step community. He provided good feedback to his fellow group member.    Family Program: Family present? No   Name of family member(s):   UDS collected: No Results:   AA/NA attended?: YesMonday, Tuesday, Wednesday, Thursday, Friday, Saturday and Sunday  Sponsor?: Yes   Mannix Kroeker, LCAS

## 2012-02-22 ENCOUNTER — Other Ambulatory Visit (HOSPITAL_COMMUNITY): Payer: No Typology Code available for payment source | Admitting: Psychology

## 2012-02-22 ENCOUNTER — Encounter (HOSPITAL_COMMUNITY): Payer: Self-pay

## 2012-02-22 NOTE — Progress Notes (Signed)
    Daily Group Progress Note  Program: CD-IOP   Group Time: 1-2:30 pm  Participation Level: Active  Behavioral Response: Sharing  Type of Therapy: Process Group  Topic: Group Process: first half of group was spent in process. Members shared about current struggles and issues in early recovery. No one reported relapsing since the last group session and there was good disclosure and discussion among members.   Group Time: 2:45- 4pm  Participation Level: Active  Behavioral Response: Appropriate and Sharing  Type of Therapy: Psycho-education Group  Topic: Psycho-Ed: second half of group was spent watching a brief video clip from the HBO special on Addiction. The scene included a review of specific brain chemistry issues in early recovery and a lengthy scene of a CBT Group in process. This scene provided a good example of the how and why of group process. Members reported finding the film helpful for them.   Summary: The patient reported the realization that his family system is disabled and is having to develop new relationships with his family members, especially his children. He reported he is going to visit his family in [place] next Friday, but is going with his brother-in-law for the support. He reminded the group his teen-age daughter has told him via phone and text that her mother seems very paranoid. Alf reported he really needs to go up and see what is happening. The patient watched the CBT video and participated in discussion about triggers, brain chemistry and healing process. He has been in many therapy groups and was very familiar with the group process. The patient emphasized the importance of the 12-step community and getting involved with a sponsor. He made some good comments.   Family Program: Family present? No   Name of family member(s):   UDS collected: No Results:  AA/NA attended?: YesMonday, Tuesday, Wednesday, Thursday, Friday, Saturday and Sunday  Sponsor?:  Yes   Lisa Milian, LCAS

## 2012-02-23 NOTE — Progress Notes (Signed)
    Daily Group Progress Note  Program: CD-IOP   Group Time: 1-2:30 pm  Participation Level: Active  Behavioral Response: Appropriate and Sharing  Type of Therapy: Psycho-education Group  Topic: "The Pros and Cons of Drug Use versus Sobriety": the first half of group was spent in psycho-ed with a presentation and ensuing discussion on the benefits of using compared to sobriety. Members were able to quickly identify the negative aspects of active drug and alcohol use. They were also able to identify many positives of drug use. As the presentation continued, the pros of sobriety were identified and they outnumbered everything but the cons of active drug use. There was good discussion and disclosure around this topic.   Group Time: 2:45- 4pm  Participation Level: Active  Behavioral Response: Sharing  Type of Therapy: Process Group  Topic: Group Process/Graduation: the second half of group was spent in process. Members shared about current issues and concerns. Two new group members were present and they shared about their lives and what had brought them to treatment. Near the end of the session, a graduation ceremony was held for a member graduating from the program. There were words of wisdom expressed and emphasis by group members that the departing member remain engaged in the 12-step community and that she continue to work her program.   Summary: the patient was able to identify many cons of using and many pros of sobriety. He shared that he was very anxious about travelling to [place] tomorrow to visit with his children and see his wife. He admitted he is scared of what his wife might do. I wondered what was the worst that could happen? The patient reported his wife might stop him from visiting with the 3 children. Another member encouraged him to be as open as possible and not provide her with anything to justify keeping them away. He assured the group he would not do anything to provoke or  upset his wife, but she has been very unstable recently. He stated he would miss group on Friday, but return on Monday. He continues to provide excellent feedback to his fellow group members and encouraged the graduating member to continue in AA and work her program.    Family Program: Family present? No   Name of family member(s):   UDS collected: No Results:   AA/NA attended?: YesMonday, Tuesday, Wednesday, Thursday, Friday, Saturday and Sunday  Sponsor?: Yes   Neesha Langton, LCAS

## 2012-02-24 ENCOUNTER — Other Ambulatory Visit (HOSPITAL_COMMUNITY): Payer: No Typology Code available for payment source | Attending: Psychiatry

## 2012-02-24 ENCOUNTER — Encounter (HOSPITAL_COMMUNITY): Payer: Self-pay

## 2012-02-24 DIAGNOSIS — F332 Major depressive disorder, recurrent severe without psychotic features: Secondary | ICD-10-CM | POA: Insufficient documentation

## 2012-02-24 DIAGNOSIS — F1920 Other psychoactive substance dependence, uncomplicated: Secondary | ICD-10-CM | POA: Insufficient documentation

## 2012-02-27 ENCOUNTER — Other Ambulatory Visit (HOSPITAL_COMMUNITY): Payer: No Typology Code available for payment source | Admitting: Psychology

## 2012-02-27 DIAGNOSIS — F1020 Alcohol dependence, uncomplicated: Secondary | ICD-10-CM

## 2012-02-28 ENCOUNTER — Encounter (HOSPITAL_COMMUNITY): Payer: Self-pay | Admitting: Psychology

## 2012-02-28 DIAGNOSIS — F1020 Alcohol dependence, uncomplicated: Secondary | ICD-10-CM | POA: Insufficient documentation

## 2012-02-28 NOTE — Progress Notes (Signed)
    Daily Group Progress Note  Program: CD-IOP   Group Time: 1-2:30 pm  Participation Level: Active  Behavioral Response: Appropriate and Sharing  Type of Therapy: Process Group  Topic:Group Process: first half of group spent in process. Members shared about their weekends and events and activities spent supporting abstinence. One group member admitted she had drunk on Friday, Saturday, and Sunday. Her sobriety date was today. This relapse was charted on the board and the member agreed that she had been struggling with very uncomfortable feelings. There was good support and disclosure among the group.    Group Time: 2:45- 4pm  Participation Level: Active and assertive  Behavioral Response: Sharing  Type of Therapy: Psycho-education Group  Topic: Building a recovery plan/graduation/introduction: second half of group as spent discussing identifying the things one needs to do to remain abstinent and the things to be avoided. Members were provided with a handout and instructed to complete and bring with them when they return on Wednesday. A new group member was present this afternoon and she was asked to introduce herself. She received good feedback and was warmly welcomed. Near the conclusion of the session a graduation ceremony was held for a group member who had successfully completed the program. There were kind words of validation and hope for the future and the graduating member was clearly touched by these words.   Summary: The member reported his trip to [place] had been biter than expected. He had been able to visit with his children and they were very happy to see him. Cadon noted his wife was still very cold and negative, but the presence of his brother-in-law necessitated that she keep her full feelings in check. He admitted he had been very anxious, and continues to be, but was pleased with the way the visit had gone. The patient emphasized the importance of attending 12-step meetings  and having others to talk to about struggles in early sobriety. He noted that he was tired of hearing 'excuses' as to why some members had not attended any meetings yet. Felton shared kind words of support and encouragement to the graduating member. He responded well to this intervention and continues to make excellent progress in his recovery.   Family Program: Family present? No   Name of family member(s):   UDS collected: No Results:  AA/NA attended?: YesMonday, Tuesday, Wednesday, Thursday, Friday, Saturday and Sunday  Sponsor?: Yes   Neng Albee, LCAS

## 2012-02-29 ENCOUNTER — Other Ambulatory Visit (HOSPITAL_COMMUNITY): Payer: No Typology Code available for payment source

## 2012-03-02 ENCOUNTER — Other Ambulatory Visit (HOSPITAL_COMMUNITY): Payer: No Typology Code available for payment source

## 2012-03-02 NOTE — Progress Notes (Incomplete)
    Daily Group Progress Note  Program: CD-IOP   Group Time: 1-2:30 pm  Participation Level: Active  Behavioral Response: Appropriate and Sharing  Type of Therapy: Process Group  Topic:      Group Time: 2:45- 4pm  Participation Level: Active  Behavioral Response: Appropriate  Type of Therapy: Psycho-education Group  Topic:    Summary:    Family Program: Family present? {BHH YES OR NO:22294}   Name of family member(s): ***  UDS collected: {BHH YES OR NO:22294} Results: {Findings; urine drug screen:60936}  AA/NA attended?: {BHH YES OR NO:22294}{DAYS OF ZOXW:96045}  Sponsor?: {BHH YES OR WU:98119}   [employer]

## 2012-03-05 ENCOUNTER — Other Ambulatory Visit (HOSPITAL_COMMUNITY): Payer: No Typology Code available for payment source

## 2012-03-05 NOTE — Progress Notes (Unsigned)
    Daily Group Progress Note  Program: CD-IOP   Group Time: 1-2:30 pm  Participation Level: Active  Behavioral Response: Appropriate and Sharing  Type of Therapy: Process Group  Topic: Group Process: first half of group spent in process. Members shared about current issues and concerns. One member had phoned prior to group and admitted he had used and wouldn't be in group. This disclosure was shared. Another member appeared after having missed 2 sessions and also having left home with whereabouts unknown for 3 days. The medical director met with the new group members individually during the course of this session  A new group member was present. He was asked to introduce himself, but he shared very little except that he had been "caught" smoking pot. With my assistance, he explained that he had been arrested for "obstruction of justice" after having smoked pot laced with ecstasy and he became psychotic. There was good discussion and feedback among the members.  Group Time: 2:45- 4pm  Participation Level: Active  Behavioral Response: Sharing  Type of Therapy: Psycho-education Group  Topic: Psycho-Ed and Graduation: Handout on "Identifying things to avoid and things to seek out in early recovery". Members had been given a 2-page handout asking them to identify what they needed to avoid in order to stay alcohol and drug-free and what they needed to do, or begin doing, if they were to remain sober. Members shared their answers and displayed good insight about what they must do going forward if they are to avoid relapse. At the conclusion of the session, the members celebrated a graduation with the "graduation ceremony". There were kind words of thanks and hope relayed to the graduating member and he wished the members well in their recovery and encouraged them to immerse themselves in the 12-Step community.   Summary: The patient reported he is anxious about graduating and will miss the  group, but noted that it was probably time for him to be moving on. He informed the group that he would be attending the Psych-IOP here in the mornings starting next week. The patient received many accolades during the graduation ceremony and emphasized the importance of the 12-step community and attending meetings. He has been an excellent group member with good insight and a commitment to abstinence throughout his time here and he will be missed.    Family Program: Family present? No   Name of family member(s):   UDS collected: No Results:   AA/NA attended?: YesMonday, Tuesday, Wednesday, Thursday, Friday, Saturday and Sunday  Sponsor?: Yes   [facility]

## 2012-03-06 ENCOUNTER — Encounter (HOSPITAL_COMMUNITY): Payer: Self-pay

## 2012-03-06 ENCOUNTER — Other Ambulatory Visit (HOSPITAL_COMMUNITY): Payer: No Typology Code available for payment source | Attending: Psychiatry | Admitting: Psychiatry

## 2012-03-06 DIAGNOSIS — F332 Major depressive disorder, recurrent severe without psychotic features: Secondary | ICD-10-CM | POA: Insufficient documentation

## 2012-03-06 DIAGNOSIS — M549 Dorsalgia, unspecified: Secondary | ICD-10-CM | POA: Insufficient documentation

## 2012-03-06 DIAGNOSIS — G8929 Other chronic pain: Secondary | ICD-10-CM | POA: Insufficient documentation

## 2012-03-06 DIAGNOSIS — F1121 Opioid dependence, in remission: Secondary | ICD-10-CM | POA: Insufficient documentation

## 2012-03-06 DIAGNOSIS — F411 Generalized anxiety disorder: Secondary | ICD-10-CM | POA: Insufficient documentation

## 2012-03-06 DIAGNOSIS — F419 Anxiety disorder, unspecified: Secondary | ICD-10-CM

## 2012-03-06 DIAGNOSIS — M26609 Unspecified temporomandibular joint disorder, unspecified side: Secondary | ICD-10-CM | POA: Insufficient documentation

## 2012-03-06 DIAGNOSIS — G4730 Sleep apnea, unspecified: Secondary | ICD-10-CM | POA: Insufficient documentation
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
## 2012-03-07 ENCOUNTER — Other Ambulatory Visit (HOSPITAL_COMMUNITY): Payer: No Typology Code available for payment source

## 2012-03-07 ENCOUNTER — Other Ambulatory Visit (HOSPITAL_COMMUNITY): Payer: No Typology Code available for payment source | Admitting: Psychiatry

## 2012-03-07 DIAGNOSIS — F419 Anxiety disorder, unspecified: Secondary | ICD-10-CM

## 2012-03-07 NOTE — Progress Notes (Signed)
    Daily Group Progress Note  Program: IOP  Group Time: 10:30 am - 12:00 pm   Participation Level: Active  Behavioral Response: Appropriate  Type of Therapy:  Process Group  Summary of Progress: Today was patients first day in the group. He was attentive and engaged as he observed the group process. He shared near the end of group how his main struggle is not depression related but more anxiety.      Group Time: 10:30 am - 12:00 pm   Participation Level:  Active  Behavioral Response: Appropriate  Type of Therapy: Psycho-education Group  Summary of Progress: Patient learned about the symptoms of depression as a clinical illness and discussed personal symptoms they are experiencing and how to become aware of them in case they return.  Carman Ching, LCSW

## 2012-03-08 ENCOUNTER — Other Ambulatory Visit (HOSPITAL_COMMUNITY): Payer: No Typology Code available for payment source | Admitting: Psychiatry

## 2012-03-08 DIAGNOSIS — F419 Anxiety disorder, unspecified: Secondary | ICD-10-CM

## 2012-03-08 NOTE — Progress Notes (Signed)
Intensive outpatient program assessment note.         Patient is 56 year old married white meal who is started intensive outpatient program after he finished the CD IOP program.  Patient endorse increased anxiety and depressive symptoms.  Patient admitted to abusing his Ambien and taking multiple medication for his insomnia.  Once he finished CD IOP he wanted to do intensive outpatient program to deal with his anxiety.  Patient endorsed increased psychosocial stressors including marital issues.  His biggest concern is insomnia.  He has taken multiple medication from his previous psychiatrist, online and his primary care physician.  Patient appears easily irritable and uncooperative to provide information.  He keeps referring to look his previous record.  Patient endorse he has multiple assessment in past few weeks and feel tired repeating the answers.  Patient has extensive medical record.  He has completed 2 inpatient rehabilitation program in [place].  His last rehabilitation treatment was in June 2013 to 11/25/2011.  Currently he is seeing Dr. [name] in [place] and taking multiple psychotropic medication for his "insomnia".  He denies any suicidal thoughts or any hallucination.  He feels his current medication is working for his insomnia.  Patient denies any history of psychosis, paranoia or any hallucination.  He endorse his anxiety mostly situational.  He admitted he has addiction problem but did not describe more in detail.  He endorse sleep has improved from 5 to 8 hours.  He denies any panic attack, crying spells, any feeling of hopelessness or helplessness.  Current psychiatric medication Gabapentin 8 in her milligram a day Cymbalta 60 mg a day Remeron 15 mg at bedtime Rozerem 8 mg at bedtime Seroquel 300 mg at bedtime  Past psychiatric history Patient denies any previous history of suicidal attempt however endorse history of suicidal thoughts but he wasn't law school.  He admitted using  Ambien almost 5 years ago when he endorse insomnia symptoms to his psychiatrist.  He remembered taking symbyax 6/50 mg few years ago, he was also taking moderate dose of Ambien to help sleep.  He was taking Ambien up to 10 tablets in 24 hours.  In May he had a course and he need was hospitalized and going through extensive withdrawal symptoms.  He was referred to detox and rehabilitation .  In the past she tried Halcion, doxepin, Demerol, Vicodin, hydrocodone, trazodone in the past.  Patient did not provide much detail and keep referring to look his previous record.  Patient also did not provide information about his alcohol use during his conversation.  Most of the information was obtained through collateral.  Medical history Patient has history of low back pain, sleep apnea, TMJ  Social history Patient is married and has 3 children.  Recently he has a lot of medical issues.  His wife wrote a letter to sign over the house.  Patient endorse he has no immediate plan to move [facility].  Alcohol and substance use history Patient has significant history of using hypnotics, opiates and has been in rehabilitation.  Patient did not talk about his alcohol however he has history of drinking alcohol as per chart.  Family history Patient endorse multiple family member has history of alcoholism and anxiety.  Mental status examination Patient is casually dressed and fairly groomed.  He appears very tense anxious and at times irritable.  Mood in conversation he is keep blinking his eyes.  He was superficially cooperative.  He maintained poor eye contact.  His speech is fast and  rapid but clear and coherent.  His thought process is also fast but logical and goal-directed.  He minimizes his symptoms but endorse he has insomnia.  His fund of knowledge is fair.  He is uncooperative to provide information and keep referring to look his previous records.  He denies any active or passive suicidal thoughts.  He  denies any auditory or visual hallucination.  He denies any obsessive thoughts, delusion or any psychotic symptoms.  His attention and concentration is fair.  Her psychomotor activity is slightly increased.  He's alert and oriented x3.  His insight judgment is fair.  His impulse control is okay.  Assessment Axis I sedatives, hypnotics and anxiolytic dependence, mood disorder NOS, generalized anxiety disorder Axis II deferred Axis III see medical history Axis IV mild to moderate Axis V 50-55  Plan Admit to intensive outpatient program, encouraged to verbalize is feeling.  Patient does not want to change his psychiatric medication.  Patient is very sensitive and reluctant about his medication.  Length of stay 2-3 weeks.

## 2012-03-08 NOTE — Progress Notes (Signed)
    Daily Group Progress Note  Program: IOP  Group Time: 9:00-10:30 am   Participation Level: Active  Behavioral Response: Appropriate  Type of Therapy:  Process Group  Summary of Progress: Patient was interactive and gave empathy and support to others. He shared about his anxiety and his recovery from prescription medications. He described feeling high anxiety now that he is not self medicating to cover the symptoms and how he needs coping skills to reduce it. He also talked about the tenuous relationship between he and his wife and how they are living separately and she is not currently a support for him.      Group Time: 10:30 am - 12:00 pm   Participation Level:  Active  Behavioral Response: Appropriate  Type of Therapy: Psycho-education Group  Summary of Progress: Patient was educated on the symptoms of anxiety and bipolar disorder and how to recognize when symptoms are returning to be able to intervene and reduce the symptoms.   Carman Ching, LCSW

## 2012-03-09 ENCOUNTER — Other Ambulatory Visit (HOSPITAL_COMMUNITY): Payer: No Typology Code available for payment source

## 2012-03-09 ENCOUNTER — Other Ambulatory Visit (HOSPITAL_COMMUNITY): Payer: No Typology Code available for payment source | Admitting: Psychiatry

## 2012-03-09 DIAGNOSIS — F419 Anxiety disorder, unspecified: Secondary | ICD-10-CM

## 2012-03-09 NOTE — Progress Notes (Signed)
    Daily Group Progress Note  Program: IOP  Group Time: 9:00-10:30 am  Participation Level: Active  Behavioral Response: Appropriate  Type of Therapy:  Process Group  Summary of Progress: Patient presented with very high anxiety today. His talking was rapid and he required redirection to allow others time to share. When members were talking about their stressors he presented with an intensity to try to solve their problems and he became very overwhelmed when taking in large amounts of information. He was aware of the high level of anxiety.      Group Time: 10:30 am - 12:00 pm   Participation Level:  Active  Behavioral Response: Appropriate  Type of Therapy: Psycho-education Group  Summary of Progress: Patient learned the science behind aromatherapy as a tool to manage stress and depression as well as how to use the scents in the room at home and in work settings for continued stress management.   Carman Ching, LCSW

## 2012-03-09 NOTE — Progress Notes (Signed)
    Daily Group Progress Note  Program: IOP  Group Time: 9:00-10:30 am   Participation Level: Active  Behavioral Response: Appropriate  Type of Therapy:  Process Group  Summary of Progress: Patient presented as calmer during the first part of group, but as it progressed he became increasingly anxious. He started talking with pressured and accelerated speech and required redirected several times to get him to stop talking. He did not have initial awareness of the height of anxiety until it was pointed out to him. It is unknown why it increases throughout the group, but will be explored with him. He shared about his strained relationship with his wife and his uncertainty if he wants to salvage the marriage. He states that "this is not a big stressor for him". He gets more stressed when he has to do things like pay bills or make decisions. He talks frequently about the support he is getting from his twelve step meetings and how he hopes to stay in [place] longer to continue to work on his sobriety from pills and alcohol.       Group Time: 10:30 am - 12:00 pm   Participation Level:  Active  Behavioral Response: Appropriate  Type of Therapy: Psycho-education Group  Summary of Progress: Patient participated in a goodbye ceremony and said goodbye to members leaving.   Carman Ching, LCSW

## 2012-03-12 ENCOUNTER — Other Ambulatory Visit (HOSPITAL_COMMUNITY): Payer: No Typology Code available for payment source | Admitting: Psychiatry

## 2012-03-12 ENCOUNTER — Other Ambulatory Visit (HOSPITAL_COMMUNITY): Payer: No Typology Code available for payment source

## 2012-03-12 DIAGNOSIS — F419 Anxiety disorder, unspecified: Secondary | ICD-10-CM

## 2012-03-12 NOTE — Progress Notes (Signed)
    Daily Group Progress Note  Program: IOP  Group Time: 9:00-10:30 am   Participation Level: Active  Behavioral Response: Appropriate  Type of Therapy:  Process Group  Summary of Progress: Patient appeared slightly calmer today and was given this feedback by group members. He appears to be trying to be more aware of his anxiety level while in the group. He rated his anxiety a 6/10 today. His speech was still accelerated and and pressured, but less so. He talked about having a scheduled meeting today with a lawyer to discuss exploring divorce procedures with his wife. He lacked emotion other than anxiety while talking about it. He states he struggles thinking things through and making decisions right now and is relying on his sister and brother-in-law to help with decision making. During the break Clinical research associate introduced patient to a breathing technique (Biofeedback) and it was affective in reducing his anxiety per patient report. He agrees to make this part of his individualized plan for anxiety management due to the effectiveness of it on his anxiety.      Group Time: 10:30 am - 12:00 pm   Participation Level:  Active  Behavioral Response: Appropriate  Type of Therapy: Psycho-education Group  Summary of Progress: Patient participated in a goodbye ceremony and practiced the skill of having healthy closure and processing feelings associated with losses.   Carman Ching, LCSW

## 2012-03-13 ENCOUNTER — Other Ambulatory Visit (HOSPITAL_COMMUNITY): Payer: No Typology Code available for payment source | Admitting: Psychiatry

## 2012-03-13 DIAGNOSIS — F419 Anxiety disorder, unspecified: Secondary | ICD-10-CM

## 2012-03-13 NOTE — Progress Notes (Signed)
    Daily Group Progress Note  Program: IOP  Group Time: 9:00-10:30 am   Participation Level: Active  Behavioral Response: Appropriate  Type of Therapy:  Process Group  Summary of Progress: Patient reports some improvement in anxiety symptoms since learning the breathing technique yesterday. He states he needs to learn anxiety management techniques to use daily to mange symptoms. He is aware that he is unable to think clearly and make decisions due to his high anxiety level. Patient describes working an active recovery program for his addiction to alcohol and prescription medications and is very focused on making sure group members are involved in support groups since they are working so well for him.      Group Time: 9:00-10:30 am   Participation Level:  Active  Behavioral Response: Appropriate  Type of Therapy: Psycho-education Group  Summary of Progress: Patient was introduced to a stress management technique (Biofeedback) and was educated on the science behind the technique as a tool to manage anxiety symptoms.   Carman Ching, LCSW

## 2012-03-14 ENCOUNTER — Other Ambulatory Visit (HOSPITAL_COMMUNITY): Payer: No Typology Code available for payment source | Admitting: Psychiatry

## 2012-03-14 ENCOUNTER — Other Ambulatory Visit (HOSPITAL_COMMUNITY): Payer: No Typology Code available for payment source

## 2012-03-14 DIAGNOSIS — F419 Anxiety disorder, unspecified: Secondary | ICD-10-CM

## 2012-03-14 NOTE — Progress Notes (Signed)
    Daily Group Progress Note  Program: IOP  Group Time: 9:00-10:30 am   Participation Level: Active  Behavioral Response: Appropriate  Type of Therapy:  Process Group  Summary of Progress: Patient provided an update on his anxiety symptoms and states he is learning to become more aware of what anxiety feels like in his body and has purchased the biofeedback program discussed in group to use daily as an anxiety management tool. He said it has been helpful to be in a group of people who also struggle with anxiety and plans to end group on Monday with new tools to help him better function with stress.      Group Time: 10:30 am - 12:00 pm   Participation Level:  Active  Behavioral Response: Appropriate  Type of Therapy: Psycho-education Group  Summary of Progress: Patient learned additional stress managment skills (biofeedback) and practiced using this calming technique during the group to manage anxiety symptoms.   Carman Ching, LCSW

## 2012-03-15 ENCOUNTER — Other Ambulatory Visit (HOSPITAL_COMMUNITY): Payer: No Typology Code available for payment source | Admitting: Psychiatry

## 2012-03-15 DIAGNOSIS — F419 Anxiety disorder, unspecified: Secondary | ICD-10-CM

## 2012-03-15 NOTE — Progress Notes (Signed)
    Daily Group Progress Note  Program: IOP  Group Time: 9:00-10:30 am   Participation Level: Active  Behavioral Response: Appropriate  Type of Therapy:  Process Group  Summary of Progress: Patient reports feeling "good" today for the first time since entering the group. He states he is learning tools to better mange his anxiety symptoms and is practicing the Biofeedback, which he states is making a difference. He is awaiting the program that he purchased so he can make this part of he wellness. He also continues to go to support groups and receives outside support for his recovery that he states is helping. He is scheduled to end on Monday.      Group Time: 10:30 am - 12:00 pm   Participation Level:  Active  Behavioral Response: Appropriate  Type of Therapy: Psycho-education Group  Summary of Progress:  Patient participated in a discussion on learning the skills of healthy boundary setting. Patient identified most with the "comliant personality" in a tendency to put others needs before their own and melt into the demands of others. Patient agreed to explore areas of stress in their life to bring to group tomorrow and explore the problem through the skills of boundary setting.   Carman Ching, LCSW

## 2012-03-16 ENCOUNTER — Other Ambulatory Visit (HOSPITAL_COMMUNITY): Payer: No Typology Code available for payment source | Admitting: Psychiatry

## 2012-03-16 ENCOUNTER — Other Ambulatory Visit (HOSPITAL_COMMUNITY): Payer: No Typology Code available for payment source

## 2012-03-16 DIAGNOSIS — F419 Anxiety disorder, unspecified: Secondary | ICD-10-CM

## 2012-03-16 NOTE — Progress Notes (Signed)
    Daily Group Progress Note  Program: IOP  Group Time: 9:00-10:30 am   Participation Level: Active  Behavioral Response: Appropriate  Type of Therapy:  Process Group  Summary of Progress: Patient is an active leader in the group. He reports stable mood for a second day in a row. He states his anxiety is under better control and he feels ready to end the group on Monday. He states he has learned coping skills and is involved in a strong support system to help him as he transitions out of the group.      Group Time: 10:30 am - 12:00 pm   Participation Level:  Active  Behavioral Response: Appropriate  Type of Therapy: Psycho-education Group  Summary of Progress: Patient participated in a goodbye ceremony to a member ending the group today and practiced the skills of expressing emotions and having healthy closure.   Carman Ching, LCSW

## 2012-03-19 ENCOUNTER — Other Ambulatory Visit (HOSPITAL_COMMUNITY): Payer: No Typology Code available for payment source | Admitting: Psychiatry

## 2012-03-19 ENCOUNTER — Other Ambulatory Visit (HOSPITAL_COMMUNITY): Payer: No Typology Code available for payment source

## 2012-03-19 DIAGNOSIS — F419 Anxiety disorder, unspecified: Secondary | ICD-10-CM

## 2012-03-19 NOTE — Addendum Note (Signed)
Addended by: Sheralyn Boatman on: 03/19/2012 11:42 AM   Modules accepted: Orders

## 2012-03-19 NOTE — Patient Instructions (Addendum)
Patient completed MH-IOP today.  Will follow up with Dr. [name] on 04-05-12 @ 2"15 pm and [name], [facility] on 03-19-12 @ 1pm.  Encouraged support groups.

## 2012-03-19 NOTE — Addendum Note (Signed)
Addended by: Margit Banda D on: 03/19/2012 01:02 PM   Modules accepted: Orders

## 2012-03-19 NOTE — Progress Notes (Signed)
    Daily Group Progress Note  Program: IOP  Group Time: 9:00-10:30 am   Participation Level: Active  Behavioral Response: Appropriate  Type of Therapy:  Process Group  Summary of Progress: Today is patients final day in the group. He initially entered the group after successful completion of the CD-IOP program for treatment of anxiety symptoms that he was feeling more severely after stopping using drugs and alcohol to numb his feelings. When he started the program, his anxiety was so high he struggled to participate in the group. He had pressured thoughts and speech that required regular redirection. He gained skills to reduce anxiety including relaxation skills, mindfulness and biofeedback. He enjoyed the results so much from the biofeedback program that he purchased the software and has made this part of his regular wellness routine. His anxiety has lessoned so he can function effectively in the group setting. He is able to think clearer and concentrate better.  He states he is ready to end the program and begin working with his individual therapist on continued recovery support and anxiety management skills.      Group Time: 10:30 am - 12:00 pm   Participation Level:  Active  Behavioral Response: Appropriate  Type of Therapy: Psycho-education Group  Summary of Progress: Patient participated in a group on grief and loss and on identifying healthy ways to grieve current losses impacted overall wellness.   Carman Ching, LCSW

## 2012-03-19 NOTE — Progress Notes (Signed)
Patient ID: Alejandro Ramos, male   DOB: 07/01/1955, 56 y.o.   MRN: [id number] D:  This is a 56 yo married caucasian male who was referred per CD-IOP, treatment for generalized anxiety and depression.  Pt completed MH-IOP today.  Denies any SI/HI or A/V hallucinations.  Reports OK sleep, appetite and concentration.  Continues to struggle with anxiety.  States groups were helpful.  A:  D/C today.  Follow up with Dr. [name] on 04-05-12 and [name], [facility] on 03-19-12.  Encouraged support groups.  R:  Pt receptive.

## 2012-03-19 NOTE — Progress Notes (Signed)
  [facility] Discharge Summary  Alejandro Ramos [id number]  Discharge Note  Patient:  Alejandro Ramos is an 56 y.o., male DOB:  02-Jan-1956  Date of Admission:  03-06-12  Date of Discharge: 03-19-12  Reason for Admission: Depression and anxiety.  Hospital Course: Patient was referred by Dr. [name],  and has therapist [name]. Patient started IOP and was continued on his home medications. Patient attended groups and was able to process issues in regards to his addiction and his family difficulties. Patient did well in groups and his mood improved and he was less anxious and was coping significantly better. Patient had no craving for chemicals.  Mental Status at Discharge: Alert, oriented x3, affect was appropriate mood was anxious, speech was normal. No suicidal or homicidal ideation was present no hallucinations or delusions. Recent and remote memory was good, judgment and insight was good, concentration and recall were good  Lab Results: No results found for this or any previous visit (from the past 48 hour(s)).  Current outpatient prescriptions:DULoxetine (CYMBALTA) 60 MG capsule, Take 60 mg by mouth at bedtime., Disp: , Rfl: ;  gabapentin (NEURONTIN) 600 MG tablet, Take 600 mg by mouth 3 (three) times daily., Disp: , Rfl: ;  mirtazapine (REMERON) 15 MG tablet, Take 15 mg by mouth at bedtime., Disp: , Rfl: ;  QUEtiapine (SEROQUEL) 300 MG tablet, Take 1 tablet (300 mg total) by mouth at bedtime., Disp: 30 tablet, Rfl: 0 ramelteon (ROZEREM) 8 MG tablet, Take 8 mg by mouth at bedtime., Disp: , Rfl: ;  mirtazapine (REMERON) 30 MG tablet, Take 1 tablet (30 mg total) by mouth at bedtime., Disp: 30 tablet, Rfl: 0;  traZODone (DESYREL) 100 MG tablet, Take 2 tablets (200 mg total) by mouth at bedtime., Disp: 60 tablet, Rfl: 0  Axis Diagnosis:   Axis I: Major Depression, Recurrent severe, 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 Back pain, chronic 12/03/2011  . Sleep apnea 12/03/2011    Has a C-PAP machine, but does not use it.  . 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 Drug-induced erectile dysfunction 12/03/2011    Induce by antidepressants   Axis IV: occupational problems, other psychosocial or environmental problems, problems related to social environment and problems with primary support group Axis V: 61-70 mild symptoms   Level of Care:  OP  Discharge destination:  Home  Is patient on multiple antipsychotic therapies at discharge:  No    Has Patient had three or more failed trials of antipsychotic monotherapy by history:  No  Patient phone:  [phone] (home)  Patient address:   [address] [place] [address],   Follow-up recommendations:  Activity:  As tolerated Diet:  Regular Other:  Followup for medications with Dr. [name], and therapy with Roxy Horseman  Comments:    The patient received suicide prevention pamphlet:  Yes Belongings returned:    Margit Banda 03/19/2012, 12:45 PM

## 2012-03-20 ENCOUNTER — Other Ambulatory Visit (HOSPITAL_COMMUNITY): Payer: No Typology Code available for payment source

## 2012-03-21 ENCOUNTER — Other Ambulatory Visit (HOSPITAL_COMMUNITY): Payer: No Typology Code available for payment source

## 2012-03-23 ENCOUNTER — Other Ambulatory Visit (HOSPITAL_COMMUNITY): Payer: No Typology Code available for payment source

## 2012-03-26 ENCOUNTER — Other Ambulatory Visit (HOSPITAL_COMMUNITY): Payer: No Typology Code available for payment source

## 2012-03-27 ENCOUNTER — Other Ambulatory Visit (HOSPITAL_COMMUNITY): Payer: No Typology Code available for payment source

## 2012-03-28 ENCOUNTER — Other Ambulatory Visit (HOSPITAL_COMMUNITY): Payer: No Typology Code available for payment source

## 2012-03-29 ENCOUNTER — Other Ambulatory Visit (HOSPITAL_COMMUNITY): Payer: No Typology Code available for payment source

## 2012-03-30 ENCOUNTER — Other Ambulatory Visit (HOSPITAL_COMMUNITY): Payer: No Typology Code available for payment source

## 2012-04-02 ENCOUNTER — Other Ambulatory Visit (HOSPITAL_COMMUNITY): Payer: No Typology Code available for payment source

## 2012-04-03 ENCOUNTER — Other Ambulatory Visit (HOSPITAL_COMMUNITY): Payer: No Typology Code available for payment source

## 2012-04-04 ENCOUNTER — Other Ambulatory Visit (HOSPITAL_COMMUNITY): Payer: No Typology Code available for payment source

## 2014-06-12 ENCOUNTER — Other Ambulatory Visit: Payer: Self-pay | Admitting: Gastroenterology

## 2014-06-12 DIAGNOSIS — R1011 Right upper quadrant pain: Secondary | ICD-10-CM

## 2014-06-12 DIAGNOSIS — R109 Unspecified abdominal pain: Secondary | ICD-10-CM

## 2014-06-13 ENCOUNTER — Ambulatory Visit (HOSPITAL_COMMUNITY): Payer: BLUE CROSS/BLUE SHIELD

## 2014-06-13 ENCOUNTER — Other Ambulatory Visit: Payer: Self-pay | Admitting: Gastroenterology

## 2014-06-13 DIAGNOSIS — R1011 Right upper quadrant pain: Secondary | ICD-10-CM

## 2014-06-19 ENCOUNTER — Other Ambulatory Visit: Payer: Self-pay | Admitting: Gastroenterology

## 2014-06-19 ENCOUNTER — Ambulatory Visit (HOSPITAL_COMMUNITY): Payer: BLUE CROSS/BLUE SHIELD

## 2014-06-19 ENCOUNTER — Other Ambulatory Visit: Payer: Self-pay | Admitting: Urology

## 2014-06-19 DIAGNOSIS — N1330 Unspecified hydronephrosis: Secondary | ICD-10-CM

## 2014-06-20 ENCOUNTER — Other Ambulatory Visit: Payer: Self-pay

## 2014-06-30 ENCOUNTER — Ambulatory Visit
Admission: RE | Admit: 2014-06-30 | Discharge: 2014-06-30 | Disposition: A | Discharge status: Home / Self Care | Payer: BLUE CROSS/BLUE SHIELD | Source: Ambulatory Visit | Attending: Urology | Admitting: Urology

## 2014-06-30 DIAGNOSIS — N1330 Unspecified hydronephrosis: Secondary | ICD-10-CM

## 2015-01-22 ENCOUNTER — Encounter: Payer: Self-pay | Admitting: Cardiovascular Disease

## 2015-07-29 DIAGNOSIS — F411 Generalized anxiety disorder: Secondary | ICD-10-CM | POA: Diagnosis not present

## 2015-07-29 DIAGNOSIS — G4709 Other insomnia: Secondary | ICD-10-CM | POA: Diagnosis not present

## 2015-09-09 DIAGNOSIS — G4709 Other insomnia: Secondary | ICD-10-CM | POA: Diagnosis not present

## 2015-09-09 DIAGNOSIS — F411 Generalized anxiety disorder: Secondary | ICD-10-CM | POA: Diagnosis not present

## 2015-09-15 DIAGNOSIS — R319 Hematuria, unspecified: Secondary | ICD-10-CM | POA: Diagnosis not present

## 2015-09-15 DIAGNOSIS — E291 Testicular hypofunction: Secondary | ICD-10-CM | POA: Diagnosis not present

## 2015-09-15 DIAGNOSIS — N281 Cyst of kidney, acquired: Secondary | ICD-10-CM | POA: Diagnosis not present

## 2015-09-15 DIAGNOSIS — R972 Elevated prostate specific antigen [PSA]: Secondary | ICD-10-CM | POA: Diagnosis not present

## 2015-09-15 DIAGNOSIS — N529 Male erectile dysfunction, unspecified: Secondary | ICD-10-CM | POA: Diagnosis not present

## 2015-09-15 DIAGNOSIS — N200 Calculus of kidney: Secondary | ICD-10-CM | POA: Diagnosis not present

## 2015-09-23 DIAGNOSIS — R5383 Other fatigue: Secondary | ICD-10-CM | POA: Diagnosis not present

## 2015-09-23 DIAGNOSIS — R197 Diarrhea, unspecified: Secondary | ICD-10-CM | POA: Diagnosis not present

## 2015-09-23 DIAGNOSIS — K580 Irritable bowel syndrome with diarrhea: Secondary | ICD-10-CM | POA: Diagnosis not present

## 2015-09-23 DIAGNOSIS — R1013 Epigastric pain: Secondary | ICD-10-CM | POA: Diagnosis not present

## 2015-10-07 DIAGNOSIS — F410 Panic disorder [episodic paroxysmal anxiety] without agoraphobia: Secondary | ICD-10-CM | POA: Diagnosis not present

## 2015-10-07 DIAGNOSIS — G4709 Other insomnia: Secondary | ICD-10-CM | POA: Diagnosis not present

## 2015-10-21 DIAGNOSIS — E6609 Other obesity due to excess calories: Secondary | ICD-10-CM | POA: Diagnosis not present

## 2015-10-21 DIAGNOSIS — R110 Nausea: Secondary | ICD-10-CM | POA: Diagnosis not present

## 2015-10-21 DIAGNOSIS — K580 Irritable bowel syndrome with diarrhea: Secondary | ICD-10-CM | POA: Diagnosis not present

## 2015-10-21 DIAGNOSIS — R5383 Other fatigue: Secondary | ICD-10-CM | POA: Diagnosis not present

## 2015-10-22 DIAGNOSIS — G4709 Other insomnia: Secondary | ICD-10-CM | POA: Diagnosis not present

## 2015-10-22 DIAGNOSIS — F411 Generalized anxiety disorder: Secondary | ICD-10-CM | POA: Diagnosis not present

## 2015-11-04 DIAGNOSIS — F411 Generalized anxiety disorder: Secondary | ICD-10-CM | POA: Diagnosis not present

## 2015-11-04 DIAGNOSIS — G4709 Other insomnia: Secondary | ICD-10-CM | POA: Diagnosis not present

## 2015-11-11 DIAGNOSIS — F411 Generalized anxiety disorder: Secondary | ICD-10-CM | POA: Diagnosis not present

## 2015-11-11 DIAGNOSIS — F3342 Major depressive disorder, recurrent, in full remission: Secondary | ICD-10-CM | POA: Diagnosis not present

## 2015-11-11 DIAGNOSIS — G4709 Other insomnia: Secondary | ICD-10-CM | POA: Diagnosis not present

## 2015-11-13 DIAGNOSIS — F411 Generalized anxiety disorder: Secondary | ICD-10-CM | POA: Diagnosis not present

## 2015-11-13 DIAGNOSIS — G4709 Other insomnia: Secondary | ICD-10-CM | POA: Diagnosis not present

## 2015-11-27 DIAGNOSIS — G4709 Other insomnia: Secondary | ICD-10-CM | POA: Diagnosis not present

## 2015-11-27 DIAGNOSIS — F411 Generalized anxiety disorder: Secondary | ICD-10-CM | POA: Diagnosis not present

## 2015-12-01 DIAGNOSIS — Z23 Encounter for immunization: Secondary | ICD-10-CM | POA: Diagnosis not present

## 2015-12-04 DIAGNOSIS — A6920 Lyme disease, unspecified: Secondary | ICD-10-CM | POA: Diagnosis not present

## 2015-12-04 DIAGNOSIS — G4700 Insomnia, unspecified: Secondary | ICD-10-CM | POA: Diagnosis not present

## 2015-12-09 ENCOUNTER — Emergency Department (HOSPITAL_COMMUNITY): Payer: BLUE CROSS/BLUE SHIELD

## 2015-12-09 ENCOUNTER — Emergency Department (HOSPITAL_COMMUNITY)
Admission: EM | Admit: 2015-12-09 | Discharge: 2015-12-09 | Disposition: A | Discharge status: Home / Self Care | Payer: BLUE CROSS/BLUE SHIELD | Attending: Emergency Medicine | Admitting: Emergency Medicine

## 2015-12-09 ENCOUNTER — Encounter (HOSPITAL_COMMUNITY): Payer: Self-pay | Admitting: *Deleted

## 2015-12-09 DIAGNOSIS — R0602 Shortness of breath: Secondary | ICD-10-CM | POA: Insufficient documentation

## 2015-12-09 DIAGNOSIS — Z5321 Procedure and treatment not carried out due to patient leaving prior to being seen by health care provider: Secondary | ICD-10-CM | POA: Insufficient documentation

## 2015-12-09 DIAGNOSIS — R55 Syncope and collapse: Secondary | ICD-10-CM | POA: Insufficient documentation

## 2015-12-09 DIAGNOSIS — Y999 Unspecified external cause status: Secondary | ICD-10-CM | POA: Insufficient documentation

## 2015-12-09 DIAGNOSIS — W228XXA Striking against or struck by other objects, initial encounter: Secondary | ICD-10-CM | POA: Insufficient documentation

## 2015-12-09 DIAGNOSIS — I447 Left bundle-branch block, unspecified: Secondary | ICD-10-CM | POA: Diagnosis not present

## 2015-12-09 DIAGNOSIS — Y929 Unspecified place or not applicable: Secondary | ICD-10-CM | POA: Insufficient documentation

## 2015-12-09 DIAGNOSIS — R110 Nausea: Secondary | ICD-10-CM | POA: Diagnosis not present

## 2015-12-09 DIAGNOSIS — Y939 Activity, unspecified: Secondary | ICD-10-CM | POA: Insufficient documentation

## 2015-12-09 LAB — CBC
HEMATOCRIT: 44.8 % (ref 39.0–52.0)
Hemoglobin: 15.8 g/dL (ref 13.0–17.0)
MCH: 31.4 pg (ref 26.0–34.0)
MCHC: 35.3 g/dL (ref 30.0–36.0)
MCV: 89.1 fL (ref 78.0–100.0)
Platelets: 241 10*3/uL (ref 150–400)
RBC: 5.03 MIL/uL (ref 4.22–5.81)
RDW: 13.1 % (ref 11.5–15.5)
WBC: 7.6 10*3/uL (ref 4.0–10.5)

## 2015-12-09 LAB — URINALYSIS, ROUTINE W REFLEX MICROSCOPIC
Bilirubin Urine: NEGATIVE
Glucose, UA: NEGATIVE mg/dL
Hgb urine dipstick: NEGATIVE
Ketones, ur: NEGATIVE mg/dL
Leukocytes, UA: NEGATIVE
NITRITE: NEGATIVE
PH: 7 (ref 5.0–8.0)
Protein, ur: NEGATIVE mg/dL
SPECIFIC GRAVITY, URINE: 1.015 (ref 1.005–1.030)

## 2015-12-09 LAB — BASIC METABOLIC PANEL
Anion gap: 9 (ref 5–15)
BUN: 14 mg/dL (ref 6–20)
CHLORIDE: 103 mmol/L (ref 101–111)
CO2: 25 mmol/L (ref 22–32)
Calcium: 10.1 mg/dL (ref 8.9–10.3)
Creatinine, Ser: 0.91 mg/dL (ref 0.61–1.24)
GFR calc non Af Amer: >60 mL/min (ref >60)
Glucose, Bld: 144 mg/dL — ABNORMAL HIGH (ref 65–99)
POTASSIUM: 3.9 mmol/L (ref 3.5–5.1)
SODIUM: 137 mmol/L (ref 135–145)

## 2015-12-09 LAB — I-STAT TROPONIN, ED: Troponin i, poc: 0.01 ng/mL (ref 0.00–0.08)

## 2015-12-09 MED ORDER — ONDANSETRON 4 MG PO TBDP
ORAL_TABLET | ORAL | Status: AC
  Filled 2015-12-09: qty 1

## 2015-12-09 MED ORDER — ONDANSETRON 4 MG PO TBDP
4.0000 mg | ORAL_TABLET | Freq: Once | ORAL | Status: AC
  Administered 2015-12-09: 4 mg via ORAL

## 2015-12-09 NOTE — ED Notes (Signed)
Pt left. 

## 2015-12-09 NOTE — ED Triage Notes (Signed)
Pt reports having multiple syncopal episodes at night with falling & hitting his head onset x 10 days, pt reports last falling & syncopal episode x2 days ago, pt went to [facility] today & sent here, pt reports that he wants to see a cardiologist d/t heart disease running in his family, pt c/o nausea, pt c/o dizziness, SOB, denies v/d, pt A&O x4

## 2015-12-10 ENCOUNTER — Encounter: Payer: Self-pay | Admitting: Cardiovascular Disease

## 2015-12-10 ENCOUNTER — Ambulatory Visit (INDEPENDENT_AMBULATORY_CARE_PROVIDER_SITE_OTHER): Payer: BLUE CROSS/BLUE SHIELD | Admitting: Cardiovascular Disease

## 2015-12-10 ENCOUNTER — Encounter (INDEPENDENT_AMBULATORY_CARE_PROVIDER_SITE_OTHER): Payer: Self-pay

## 2015-12-10 VITALS — BP 142/90 | HR 97 | Ht 70.0 in | Wt 287.0 lb

## 2015-12-10 DIAGNOSIS — E785 Hyperlipidemia, unspecified: Secondary | ICD-10-CM

## 2015-12-10 DIAGNOSIS — R7309 Other abnormal glucose: Secondary | ICD-10-CM | POA: Diagnosis not present

## 2015-12-10 DIAGNOSIS — I447 Left bundle-branch block, unspecified: Secondary | ICD-10-CM

## 2015-12-10 DIAGNOSIS — I2510 Atherosclerotic heart disease of native coronary artery without angina pectoris: Secondary | ICD-10-CM | POA: Diagnosis not present

## 2015-12-10 DIAGNOSIS — Z6835 Body mass index (BMI) 35.0-35.9, adult: Secondary | ICD-10-CM

## 2015-12-10 DIAGNOSIS — R0602 Shortness of breath: Secondary | ICD-10-CM

## 2015-12-10 DIAGNOSIS — R55 Syncope and collapse: Secondary | ICD-10-CM

## 2015-12-10 DIAGNOSIS — Z13220 Encounter for screening for lipoid disorders: Secondary | ICD-10-CM | POA: Diagnosis not present

## 2015-12-10 DIAGNOSIS — E782 Mixed hyperlipidemia: Secondary | ICD-10-CM | POA: Insufficient documentation

## 2015-12-10 DIAGNOSIS — G4733 Obstructive sleep apnea (adult) (pediatric): Secondary | ICD-10-CM

## 2015-12-10 LAB — LIPID PANEL
Cholesterol: 181 mg/dL (ref 125–200)
HDL: 68 mg/dL (ref >=40)
LDL CALC: 92 mg/dL (ref <130)
Total CHOL/HDL Ratio: 2.7 Ratio (ref <=5.0)
Triglycerides: 106 mg/dL (ref <150)
VLDL: 21 mg/dL (ref <30)

## 2015-12-10 LAB — GLUCOSE, RANDOM: GLUCOSE: 156 mg/dL — AB (ref 65–99)

## 2015-12-10 NOTE — Progress Notes (Signed)
Cardiology consultation Note    Date:  12/10/2015   ID:  Alejandro Ramos, DOB 25-Oct-1955, MRN [id number]  PCP:  Emeterio Reeve, MD  Cardiologist:   Thurmon Fair, MD  Reason for consultation: Syncope, multiple coronary risk factors Chief Complaint  Patient presents with  . Follow-up    New patient.    History of Present Illness:  Alejandro Ramos is a 60 y.o. male who presents for evaluation after an attempted visit to the emergency room. He was concerned about the possibility of having heart disease and had a few episodes of "passing out". He presents to the emergency room, had an electrocardiogram and blood drawn, waited for 4 hours and then left before being evaluated by physician.  His history is a little difficult to obtain. His speech is pressured and he quickly jumps from one topic to the next. He reports episodes of possible loss of consciousness. He finds it very difficult to be precise about the exact time and specifics of these episodes. Fairly consistently, the episodes appear to be associated with nausea. He is usually in bed when they happen. He has not fallen or injured himself. He has never had witnessed syncope.   He has long-standing severe problems with insomnia and has daytime hypersomnolence. He does not think he has obstructive sleep apnea. However, this was reportedly identified by previous sleep test and CPAP was prescribed. He does not use it.  He appears much more preoccupied by the fact that he has multiple risk factors for heart disease. He believes that there is a very strong family history of coronary disease. His father began to have heart problems in his 17s but is still alive with multivessel coronary disease and congestive heart failure in his 38s. His mother is 9 years old and still alive. She has a pacemaker. Other family members also have pacemakers. I can't identify a family member that seems have had early onset/premature coronary disease.  He is  morbidly obese, has borderline diabetes mellitus and has an adverse lipid profile. He thinks all of these problems are related to treatment with Seroquel. He has been on this medication for possibly 10 years and is trying to wean off. He reports that he was taking it due to complaints of depression, anxiety but especially due to insomnia. He states that in the last couple of years his weight has increased from 218 pounds up to 294 pounds due to this medication.  He performs water aerobics, walks on a treadmill and uses light weights at a local gym. He never experiences chest discomfort or shortness of breath during most of his activities. The water aerobics does sometimes make him breathless. He has had occasional chest twinges that occur at rest. He denies palpitations, leg edema or claudication. He has long-standing issues with erectile dysfunction. He bears a diagnosis of androgen deficiency, but is not on replacement at this time.  He reports having undergone a nuclear stress test and echocardiogram 10 or 15 years ago when he lived in [place]. He remembers having a left bundle branch block then as well. He reports those tests have been normal. An abdominal CT performed in 2016 or nephrolithiasis incidentally showed evidence of mild atherosclerotic ossification in the LAD and right coronary arteries, abdominal and pelvic vasculature without evidence of an abdominal aortic aneurysm. Study also identified fatty liver changes.  Records from 2013 mention alcohol dependence. He denies alcohol use at this time.  Past Medical History:  Diagnosis Date  . Back  pain, chronic 12/03/2011  . 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 Sleep apnea 12/03/2011   Has a C-PAP machine, but does not use it.  . 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 No pertinent surgical history.  Current Medications: Outpatient Medications Prior to Visit  Medication Sig Dispense  Refill  . gabapentin (NEURONTIN) 600 MG tablet Take 600 mg by mouth 3 (three) times daily.    . ramelteon (ROZEREM) 8 MG tablet Take 8 mg by mouth at bedtime.    . DULoxetine (CYMBALTA) 60 MG capsule Take 60 mg by mouth at bedtime.    . mirtazapine (REMERON) 15 MG tablet Take 15 mg by mouth at bedtime.    . mirtazapine (REMERON) 30 MG tablet Take 1 tablet (30 mg total) by mouth at bedtime. 30 tablet 0  . QUEtiapine (SEROQUEL) 300 MG tablet Take 1 tablet (300 mg total) by mouth at bedtime. 30 tablet 0  . traZODone (DESYREL) 100 MG tablet Take 2 tablets (200 mg total) by mouth at bedtime. 60 tablet 0   No facility-administered medications prior to visit.      Allergies:   Review of patient's allergies indicates no known allergies.   Social History   Social History  . Marital status: Divorced    Spouse name: N/A  . Number of children: N/A  . Years of education: N/A   Social History Main Topics  . Smoking status: Never Smoker  . Smokeless tobacco: Never Used  . Alcohol use No  . Drug use: No  . Sexual activity: Not Currently   Other Topics Concern  . None   Social History Narrative  . None     Family History:  The patient's family history includes Paranoid behavior in his father.   ROS:   Please see the history of present illness.    ROS All other systems reviewed and are negative.   PHYSICAL EXAM:   VS:  BP (!) 142/90   Pulse 97   Ht 5\' 10"  (1.778 m)   Wt 287 lb (130.2 kg)   BMI 41.18 kg/m    GEN: Well nourished, well developed, in no acute distress  HEENT: normal  Neck: no JVD, carotid bruits, or masses Cardiac: RRR; no murmurs, rubs, or gallops,no edema  Respiratory:  clear to auscultation bilaterally, normal work of breathing GI: soft, nontender, nondistended, + BS MS: no deformity or atrophy  Skin: warm and dry, no rash Neuro:  Alert and Oriented x 3, Strength and sensation are intact Psych: euthymic mood, full affect  Wt Readings from Last 3 Encounters:    12/10/15 287 lb (130.2 kg)  12/09/15 294 lb (133.4 kg)      Studies/Labs Reviewed:   EKG:  EKG is not ordered today.  The ekg ordered today demonstrates sinus rhythm/mild sinus tachycardia and left bundle branch block.  Recent Labs: 12/09/2015: BUN 14; Creatinine, Ser 0.91; Hemoglobin 15.8; Platelets 241; Potassium 3.9; Sodium 137   Lipid Panel Total cholesterol 223, HDL 65, triglycerides 93, LDL 139 (01/22/2015), hemoglobin A1c 6%  Additional studies/ records that were reviewed today include:  Brief records from emergency room evaluation and records from primary care physician    ASSESSMENT:    1. Shortness of breath   2. Left bundle branch block (LBBB)   3. Coronary artery calcification seen on CT scan   4. Hyperlipidemia   5. Morbid obesity due to excess calories (HCC)   6. Elevated glucose level   7.  OSA (obstructive sleep apnea)   8. Syncope, unspecified syncope type      PLAN:  In order of problems listed above:  1. Coronary calcifications: These were incidentally noted on a previous CT scan in 2016. He should undergo a functional study. Since he has a left bundle branch block, it is appropriate to perform a pharmacological nuclear stress test. He does not appear to have angina pectoris, but has some degree of exertional dyspnea. 2. Dyspnea: While this could be related to coronary insufficiency, it could also well be related to severe obesity and untreated obstructive sleep apnea. 3. LBBB: This is a chronic abnormality per his report. It limits our ability to evaluate for coronary insufficiency. If left ventricular systolic function is normal, no specific treatment is needed. Recommend he has an echocardiogram to evaluate dyspnea and to the procedure for structural heart disease as an explanation for his conduction abnormality. 4. Hyperlipidemia: His lipid profile (as of September 2016) shows an undesirably high LDL cholesterol level,but in and of itself does not mandate  pharmacological therapy unless we identify coronary/vascular disease. One could argue that the presence of arthroscopic calcifications is sufficient and I do believe he is at risk for progression to significant vascular complications. Ideally LDL cholesterol less than 147 mg/dL. He does not appear to incline to take medications at this time. Will discuss with him after we have the results of his cardiac workup. I also recommend repeat his lipid profile since it is now about a year old and he has gained substantial weight in the interval. 5. Morbid obesity: This is clearly the cause for his borderline diabetes mellitus and is contributing significantly to his hyperlipidemia as well as other possible medical problems. Weight loss is critical for his long-term prognosis. I agree that the Seroquel may have a lot to do with his weight gain and ideally an alternative agent defined to deal with his psychiatric and sleep issues. 6. Pre-diabetes: Try to avoid atypical antipsychotics that worsen glucose tolerance, lose weight, exercise more frequently, improved diet. A1c 6%- pharmacological therapy not yet indicated (except maybe metformin?). 7. Reported history of obstructive sleep apnea: If this is indeed confirmed, CPAP treatment would be critical. Will discuss with him once the evaluate his echo for evidence of cor pulmonale/pulmonary hypertension 8. Syncope: It is possible that he had some episodes of vasovagal syncope, since nausea was a prominent complaint. I wonder whether his loss of consciousness may be uncontrollable hypersomnolence in a patient with untreated sleep apnea and chronic insomnia. One cannot completely exclude arrhythmic syncope since he has a left bundle branch block, but the presentation was highly atypical for arrhythmic syncope. He does have relatives with pacemakers, but it sounds like they received her device is at very advanced ages. If syncope is confirmed, he may need to have a loop  recorder.    Medication Adjustments/Labs and Tests Ordered: Current medicines are reviewed at length with the patient today.  Concerns regarding medicines are outlined above.  Medication changes, Labs and Tests ordered today are listed in the Patient Instructions below. Patient Instructions  Medication Instructions: Dr Royann Shivers recommends that you continue on your current medications as directed. Please refer to the Current Medication list given to you today.  Labwork: Your physician recommends that you return for lab work TODAY.  Testing/Procedures: 1. Echocardiogram - Your physician has requested that you have an echocardiogram. Echocardiography is a painless test that uses sound waves to create images of your heart. It provides your doctor  with information about the size and shape of your heart and how well your heart's chambers and valves are working. This procedure takes approximately one hour. There are no restrictions for this procedure.  2. Eugenie Birks Myoview - Your physician has requested that you have a lexiscan myoview. For further information please visit [email or web address] Please follow instruction sheet, as given.  Follow-up: Dr Royann Shivers recommends that you schedule a follow-up appointment first available.  If you need a refill on your cardiac medications before your next appointment, please call your pharmacy.    Signed, Thurmon Fair, MD  12/10/2015 1:31 PM    [facility] [address] [place], [place], [place]  [address] Phone: [phone]; Fax: [phone]

## 2015-12-10 NOTE — Patient Instructions (Signed)
Medication Instructions: Dr Royann Shivers recommends that you continue on your current medications as directed. Please refer to the Current Medication list given to you today.  Labwork: Your physician recommends that you return for lab work TODAY.  Testing/Procedures: 1. Echocardiogram - Your physician has requested that you have an echocardiogram. Echocardiography is a painless test that uses sound waves to create images of your heart. It provides your doctor with information about the size and shape of your heart and how well your heart's chambers and valves are working. This procedure takes approximately one hour. There are no restrictions for this procedure.  2. Eugenie Birks Myoview - Your physician has requested that you have a lexiscan myoview. For further information please visit [email or web address] Please follow instruction sheet, as given.  Follow-up: Dr Royann Shivers recommends that you schedule a follow-up appointment first available.  If you need a refill on your cardiac medications before your next appointment, please call your pharmacy.

## 2015-12-14 DIAGNOSIS — G4700 Insomnia, unspecified: Secondary | ICD-10-CM | POA: Diagnosis not present

## 2015-12-29 ENCOUNTER — Telehealth (HOSPITAL_COMMUNITY): Payer: Self-pay

## 2015-12-29 ENCOUNTER — Other Ambulatory Visit: Payer: Self-pay

## 2015-12-29 ENCOUNTER — Ambulatory Visit (HOSPITAL_COMMUNITY): Payer: BLUE CROSS/BLUE SHIELD | Attending: Cardiovascular Disease

## 2015-12-29 DIAGNOSIS — G4733 Obstructive sleep apnea (adult) (pediatric): Secondary | ICD-10-CM | POA: Diagnosis not present

## 2015-12-29 DIAGNOSIS — I517 Cardiomegaly: Secondary | ICD-10-CM | POA: Diagnosis not present

## 2015-12-29 DIAGNOSIS — E785 Hyperlipidemia, unspecified: Secondary | ICD-10-CM | POA: Diagnosis not present

## 2015-12-29 DIAGNOSIS — R0602 Shortness of breath: Secondary | ICD-10-CM | POA: Diagnosis not present

## 2015-12-29 DIAGNOSIS — I447 Left bundle-branch block, unspecified: Secondary | ICD-10-CM | POA: Diagnosis not present

## 2015-12-29 DIAGNOSIS — Z6841 Body Mass Index (BMI) 40.0 and over, adult: Secondary | ICD-10-CM | POA: Insufficient documentation

## 2015-12-29 DIAGNOSIS — R0600 Dyspnea, unspecified: Secondary | ICD-10-CM | POA: Diagnosis not present

## 2015-12-29 NOTE — Telephone Encounter (Signed)
Encounter complete. 

## 2015-12-31 ENCOUNTER — Ambulatory Visit (HOSPITAL_COMMUNITY)
Admission: RE | Admit: 2015-12-31 | Discharge: 2015-12-31 | Disposition: A | Discharge status: Home / Self Care | Payer: BLUE CROSS/BLUE SHIELD | Source: Ambulatory Visit | Attending: Cardiovascular Disease | Admitting: Cardiovascular Disease

## 2015-12-31 DIAGNOSIS — R55 Syncope and collapse: Secondary | ICD-10-CM | POA: Diagnosis not present

## 2015-12-31 DIAGNOSIS — R0602 Shortness of breath: Secondary | ICD-10-CM

## 2015-12-31 DIAGNOSIS — Z8249 Family history of ischemic heart disease and other diseases of the circulatory system: Secondary | ICD-10-CM | POA: Insufficient documentation

## 2015-12-31 DIAGNOSIS — Z6841 Body Mass Index (BMI) 40.0 and over, adult: Secondary | ICD-10-CM | POA: Diagnosis not present

## 2015-12-31 DIAGNOSIS — G4733 Obstructive sleep apnea (adult) (pediatric): Secondary | ICD-10-CM | POA: Diagnosis not present

## 2015-12-31 DIAGNOSIS — I2510 Atherosclerotic heart disease of native coronary artery without angina pectoris: Secondary | ICD-10-CM | POA: Insufficient documentation

## 2015-12-31 DIAGNOSIS — I517 Cardiomegaly: Secondary | ICD-10-CM | POA: Diagnosis not present

## 2015-12-31 DIAGNOSIS — I447 Left bundle-branch block, unspecified: Secondary | ICD-10-CM

## 2015-12-31 DIAGNOSIS — E669 Obesity, unspecified: Secondary | ICD-10-CM | POA: Diagnosis not present

## 2015-12-31 MED ORDER — REGADENOSON 0.4 MG/5ML IV SOLN
0.4000 mg | Freq: Once | INTRAVENOUS | Status: AC
  Administered 2015-12-31: 0.4 mg via INTRAVENOUS

## 2015-12-31 MED ORDER — AMINOPHYLLINE 25 MG/ML IV SOLN
75.0000 mg | Freq: Once | INTRAVENOUS | Status: AC
  Administered 2015-12-31: 75 mg via INTRAVENOUS

## 2015-12-31 MED ORDER — TECHNETIUM TC 99M TETROFOSMIN IV KIT
29.8000 | PACK | Freq: Once | INTRAVENOUS | Status: AC | PRN
  Administered 2015-12-31: 29.8 via INTRAVENOUS
  Filled 2015-12-31: qty 30

## 2016-01-01 ENCOUNTER — Ambulatory Visit (HOSPITAL_COMMUNITY)
Admission: RE | Admit: 2016-01-01 | Discharge: 2016-01-01 | Disposition: A | Discharge status: Home / Self Care | Payer: BLUE CROSS/BLUE SHIELD | Source: Ambulatory Visit | Attending: Cardiology | Admitting: Cardiology

## 2016-01-01 LAB — MYOCARDIAL PERFUSION IMAGING
LV sys vol: 111 mL
LVDIAVOL: 172 mL (ref 62–150)
NUC STRESS TID: 0.93
Peak HR: 104 {beats}/min
Rest HR: 88 {beats}/min
SDS: 1
SRS: 4
SSS: 5

## 2016-01-01 MED ORDER — TECHNETIUM TC 99M TETROFOSMIN IV KIT: 29.8000 | PACK | Freq: Once | INTRAVENOUS | Status: DC | PRN

## 2016-01-01 MED ORDER — TECHNETIUM TC 99M TETROFOSMIN IV KIT
28.8000 | PACK | Freq: Once | INTRAVENOUS | Status: AC | PRN
  Administered 2016-01-01: 28.8 via INTRAVENOUS

## 2016-01-06 ENCOUNTER — Ambulatory Visit (INDEPENDENT_AMBULATORY_CARE_PROVIDER_SITE_OTHER): Payer: BLUE CROSS/BLUE SHIELD | Admitting: Cardiovascular Disease

## 2016-01-06 ENCOUNTER — Encounter: Payer: Self-pay | Admitting: Cardiovascular Disease

## 2016-01-06 ENCOUNTER — Other Ambulatory Visit: Payer: Self-pay | Admitting: Cardiovascular Disease

## 2016-01-06 VITALS — BP 141/91 | HR 98 | Ht 70.0 in | Wt 289.2 lb

## 2016-01-06 DIAGNOSIS — D689 Coagulation defect, unspecified: Secondary | ICD-10-CM

## 2016-01-06 DIAGNOSIS — G4733 Obstructive sleep apnea (adult) (pediatric): Secondary | ICD-10-CM

## 2016-01-06 DIAGNOSIS — Z01812 Encounter for preprocedural laboratory examination: Secondary | ICD-10-CM

## 2016-01-06 DIAGNOSIS — I429 Cardiomyopathy, unspecified: Secondary | ICD-10-CM

## 2016-01-06 DIAGNOSIS — I428 Other cardiomyopathies: Secondary | ICD-10-CM | POA: Insufficient documentation

## 2016-01-06 DIAGNOSIS — R5383 Other fatigue: Secondary | ICD-10-CM

## 2016-01-06 DIAGNOSIS — R55 Syncope and collapse: Secondary | ICD-10-CM

## 2016-01-06 DIAGNOSIS — R7303 Prediabetes: Secondary | ICD-10-CM | POA: Insufficient documentation

## 2016-01-06 DIAGNOSIS — E785 Hyperlipidemia, unspecified: Secondary | ICD-10-CM

## 2016-01-06 DIAGNOSIS — I5042 Chronic combined systolic (congestive) and diastolic (congestive) heart failure: Secondary | ICD-10-CM

## 2016-01-06 DIAGNOSIS — I447 Left bundle-branch block, unspecified: Secondary | ICD-10-CM

## 2016-01-06 DIAGNOSIS — R931 Abnormal findings on diagnostic imaging of heart and coronary circulation: Secondary | ICD-10-CM | POA: Diagnosis not present

## 2016-01-06 LAB — BASIC METABOLIC PANEL
BUN: 21 mg/dL (ref 7–25)
CHLORIDE: 102 mmol/L (ref 98–110)
CO2: 26 mmol/L (ref 20–31)
Calcium: 9.5 mg/dL (ref 8.6–10.3)
Creat: 0.85 mg/dL (ref 0.70–1.25)
Glucose, Bld: 100 mg/dL — ABNORMAL HIGH (ref 65–99)
POTASSIUM: 4.5 mmol/L (ref 3.5–5.3)
Sodium: 138 mmol/L (ref 135–146)

## 2016-01-06 LAB — CBC
HEMATOCRIT: 45.7 % (ref 38.5–50.0)
Hemoglobin: 15.9 g/dL (ref 13.2–17.1)
MCH: 30.8 pg (ref 27.0–33.0)
MCHC: 34.8 g/dL (ref 32.0–36.0)
MCV: 88.6 fL (ref 80.0–100.0)
MPV: 10.3 fL (ref 7.5–12.5)
Platelets: 208 10*3/uL (ref 140–400)
RBC: 5.16 MIL/uL (ref 4.20–5.80)
RDW: 13.7 % (ref 11.0–15.0)
WBC: 6.6 10*3/uL (ref 3.8–10.8)

## 2016-01-06 MED ORDER — VALSARTAN 160 MG PO TABS: 160.0000 mg | ORAL_TABLET | Freq: Every day | ORAL | 11 refills | Status: DC

## 2016-01-06 NOTE — Patient Instructions (Signed)
Dr Royann Shivers has recommended making the following medication changes: 1. START Valsartan 160 mg - take 1 tablet by mouth daily  Your physician has requested that you have a right and left cardiac catheterization with Dr Royann Shivers on 01/12/16. Cardiac catheterization is used to diagnose and/or treat various heart conditions. Doctors may recommend this procedure for a number of different reasons. The most common reason is to evaluate chest pain. Chest pain can be a symptom of coronary artery disease (CAD), and cardiac catheterization can show whether plaque is narrowing or blocking your heart's arteries. This procedure is also used to evaluate the valves, as well as measure the blood flow and oxygen levels in different parts of your heart. For further information please visit [email or web address]   Following your catheterization, you will not be allowed to drive for 3 days.  No lifting, pushing, or pulling greater that 10 pounds is allowed for 1 week.  You will be required to have the following tests prior to the procedure:  1. Blood work - the blood work can be done no more than 7 days prior to the procedure. It can be done at any [facility] lab. There is one downstairs on the first floor of this building and one in the [facility] building [phone]).   Please hold the following medication(s) on the day of your procedure: Valsartan

## 2016-01-06 NOTE — Progress Notes (Signed)
 Cardiology consultation Note    Date:  01/06/2016   ID:  Jayan Burak, DOB 12/11/1955, MRN [id number]  PCP:  WOLTERS,SHARON A, MD  Cardiologist:   Warner Laduca, MD  Reason for consultation: Syncope, multiple coronary risk factors No chief complaint on file.   History of Present Illness:  Alejandro Ramos is a 60 y.o. male who presents for Follow-up after undergoing an echocardiogram and a nuclear stress test. Both studies were markedly abnormal showing left ventricular systolic function estimated at 25-30%by echo, 36% by the nuclear test. No reversible ischemia was seen on the nuclear study and he had a small fixed defect which was probably left bundle branch block related artifact. No significant valvular abnormalities were seen.  Today he denies ever having syncope. He insists that the previous episodes of falls were not related to loss of consciousness. Since his last appointment he has fallen and hit his head on the nightstand once. I believe this is the first time he has actually injured himself. He has a small crusted scab on his left temple. He believes these are episodes of "microsleep".  He has not had angina or worsening of his chronic dyspnea, but feels always tired.  As before, he quickly jumps from one topic to the next. He again complains about severe problems with insomnia and has daytime hypersomnolence. He does not think he has obstructive sleep apnea. However, this was reportedly identified by previous sleep test and CPAP was prescribed. He does not use it.  He again returns to discuss how so many of his health problems are related to treatment with Seroquel. He has been on this medication for possibly 10 years and is trying to wean off. He reports that he was taking it due to complaints of depression, anxiety, but especially due to insomnia. He states that in the last couple of years his weight has increased from 218 pounds up to 294 pounds due to this medication.  When I  described the fact that he has a cardiomyopathy, April the fact that he thinks it could be related to Lyme disease. He reports has been unaware of this diagnosis for at least the last 10 years and has had previous workup and treatment for it.   He denies alcohol use at this time.  Past Medical History:  Diagnosis Date  . Back pain, chronic 12/03/2011  . 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 Sleep apnea 12/03/2011   Has a C-PAP machine, but does not use it.  . 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  Current Medications: Outpatient Medications Prior to Visit  Medication Sig Dispense Refill  . gabapentin (NEURONTIN) 600 MG tablet Take 600 mg by mouth 3 (three) times daily.    . ramelteon (ROZEREM) 8 MG tablet Take 8 mg by mouth at bedtime.     No facility-administered medications prior to visit.      Allergies:   Review of patient's allergies indicates no known allergies.   Social History   Social History  . Marital status: Divorced    Spouse name: N/A  . Number of children: N/A  . Years of education: N/A   Social History Main Topics  . Smoking status: Never Smoker  . Smokeless tobacco: Never Used  . Alcohol use No  . Drug use: No  . Sexual activity: Not Currently   Other Topics Concern  . None   Social History Narrative  . None       Family History:  The patient's family history includes Paranoid behavior in his father.   ROS:   Please see the history of present illness.    ROS All other systems reviewed and are negative.   PHYSICAL EXAM:   VS:  BP (!) 141/91   Pulse 98   Ht 5' 10" (1.778 m)   Wt 289 lb 3.2 oz (131.2 kg)   BMI 41.50 kg/m    GEN: Well nourished, well developed, in no acute distress  HEENT: normal  Neck: no JVD, carotid bruits, or masses Cardiac: Paradoxically split S2, borderline tachycardia, RRR; no murmurs, rubs, or gallops,no edema  Respiratory:  clear to auscultation  bilaterally, normal work of breathing GI: soft, nontender, nondistended, + BS MS: no deformity or atrophy  Skin: warm and dry, no rash Neuro:  Alert and Oriented x 3, Strength and sensation are intact Psych: euthymic mood, full affect  Wt Readings from Last 3 Encounters:  01/06/16 289 lb 3.2 oz (131.2 kg)  12/31/15 287 lb (130.2 kg)  12/10/15 287 lb (130.2 kg)      Studies/Labs Reviewed:   EKG:  EKG is not ordered today.   Recent Labs: 12/09/2015: BUN 14; Creatinine, Ser 0.91; Hemoglobin 15.8; Platelets 241; Potassium 3.9; Sodium 137   Lipid Panel Total cholesterol 223, HDL 65, triglycerides 93, LDL 139 (01/22/2015), hemoglobin A1c 6%  Additional studies/ records that were reviewed today include:  Brief records from emergency room evaluation and records from primary care physician    ASSESSMENT:    1. Chronic combined systolic and diastolic heart failure (HCC)   2. Cardiomyopathy (HCC)   3. LBBB (left bundle branch block)   4. Hyperlipidemia   5. Morbid obesity due to excess calories (HCC)   6. Pre-diabetes   7. OSA (obstructive sleep apnea)   8. Syncope, unspecified syncope type   9. Pre-procedure lab exam   10. Other fatigue   11. Blood clotting disorder (HCC)      PLAN:  In order of problems listed above:  1. CHF: It appears likely that his dyspnea is related to heart failure in addition to obesity and deconditioning. We'll plan right left heart catheterization and will start treatment with an angiotensin receptor blocker. Will hold off diuretics and beta blockers until after his catheterization and assessment of his volume status. Clinically not overtly hypervolemic. 2. Cardiomyopathy: Coronary calcifications were incidentally noted on a previous CT scan in 2016. Overall the appearance of his echo and nuclear test suggests nonischemic cardiomyopathy, but he could well have widespread CAD or dominant left main stenosis. If LVEF remains this low after appropriate  revascularization and/or medical therapy, will have to discuss defibrillator. 3. LBBB: This is a chronic abnormality per his report. If he does not respond well to medical therapy for heart failure, option for CRT. 4. Hyperlipidemia: Ideally LDL cholesterol less than 100 mg/dL, triglycerides less than 70 if her identify significant coronary obstruction. Will get n updated lipid profile. 5. Morbid obesity: This is clearly the cause for his borderline diabetes mellitus and is contributing significantly to his hyperlipidemia as well as other possible medical problems. Weight loss is critical for his long-term prognosis. I agree that the Seroquel may have a lot to do with his weight gain and ideally an alternative agent defined to deal with his psychiatric and sleep issues. Told him that I am not the right physician to make new recommendations for sleep aids. 6. Pre-diabetes: Try to avoid atypical antipsychotics that worsen glucose tolerance, lose   weight, exercise more frequently, improved diet. A1c 6%- pharmacological therapy not yet indicated (except maybe metformin?). 7. Reported history of obstructive sleep apnea: If this is indeed confirmed, CPAP treatment would be critical. Will discuss with him once the results of his right heart catheterization are available. 8. Syncope: It is possible that he had some episodes of vasovagal syncope, since nausea was a prominent complaint. I wonder whether his loss of consciousness may be uncontrollable hypersomnolence in a patient with untreated sleep apnea and chronic insomnia. One cannot completely exclude bradyarrhythmic syncope since he has a left bundle branch block. The most urgent concern is that of possible ventricular tachycardia. He may need to have an ICD. This is another decision that we'll have to wait until after cardiac catheterization and treatment.  Right and left heart catheterization procedure has been fully reviewed with the patient and informed  consent has been obtained.     Medication Adjustments/Labs and Tests Ordered: Current medicines are reviewed at length with the patient today.  Concerns regarding medicines are outlined above.  Medication changes, Labs and Tests ordered today are listed in the Patient Instructions below. Patient Instructions  Dr Stellan Vick has recommended making the following medication changes: 1. START Valsartan 160 mg - take 1 tablet by mouth daily  Your physician has requested that you have a right and left cardiac catheterization with Dr Mamoru Takeshita on 01/12/16. Cardiac catheterization is used to diagnose and/or treat various heart conditions. Doctors may recommend this procedure for a number of different reasons. The most common reason is to evaluate chest pain. Chest pain can be a symptom of coronary artery disease (CAD), and cardiac catheterization can show whether plaque is narrowing or blocking your heart's arteries. This procedure is also used to evaluate the valves, as well as measure the blood flow and oxygen levels in different parts of your heart. For further information please visit [email or web address].   Following your catheterization, you will not be allowed to drive for 3 days.  No lifting, pushing, or pulling greater that 10 pounds is allowed for 1 week.  You will be required to have the following tests prior to the procedure:  1. Blood work - the blood work can be done no more than 7 days prior to the procedure. It can be done at any [facility] lab. There is one downstairs on the first floor of this building and one in the [facility] building ([address], [address]).   Please hold the following medication(s) on the day of your procedure: Valsartan    Signed, Aquanetta Schwarz, MD  01/06/2016 1:06 PM    [facility] [address], [place], [place]  [address] Phone: [phone]; Fax: [phone]   

## 2016-01-07 LAB — TSH: TSH: 3.87 mIU/L (ref 0.40–4.50)

## 2016-01-07 LAB — SEDIMENTATION RATE: SED RATE: 1 mm/h (ref 0–20)

## 2016-01-07 LAB — PROTIME-INR
INR: 1
Prothrombin Time: 10.4 s (ref 9.0–11.5)

## 2016-01-07 LAB — APTT: APTT: 29 s (ref 22–34)

## 2016-01-07 LAB — LYME AB/WESTERN BLOT REFLEX: B burgdorferi Ab IgG+IgM: <0.9 Index (ref <0.90)

## 2016-01-12 ENCOUNTER — Encounter (HOSPITAL_COMMUNITY)
Admission: RE | Disposition: A | Discharge status: Home / Self Care | Payer: Self-pay | Source: Ambulatory Visit | Attending: Cardiovascular Disease

## 2016-01-12 ENCOUNTER — Ambulatory Visit (HOSPITAL_COMMUNITY)
Admission: RE | Admit: 2016-01-12 | Discharge: 2016-01-12 | Disposition: A | Discharge status: Home / Self Care | Payer: BLUE CROSS/BLUE SHIELD | Source: Ambulatory Visit | Attending: Cardiovascular Disease | Admitting: Cardiovascular Disease

## 2016-01-12 DIAGNOSIS — E785 Hyperlipidemia, unspecified: Secondary | ICD-10-CM | POA: Diagnosis not present

## 2016-01-12 DIAGNOSIS — I5042 Chronic combined systolic (congestive) and diastolic (congestive) heart failure: Secondary | ICD-10-CM | POA: Diagnosis not present

## 2016-01-12 DIAGNOSIS — F419 Anxiety disorder, unspecified: Secondary | ICD-10-CM | POA: Diagnosis not present

## 2016-01-12 DIAGNOSIS — F329 Major depressive disorder, single episode, unspecified: Secondary | ICD-10-CM | POA: Diagnosis not present

## 2016-01-12 DIAGNOSIS — I420 Dilated cardiomyopathy: Secondary | ICD-10-CM | POA: Insufficient documentation

## 2016-01-12 DIAGNOSIS — I2584 Coronary atherosclerosis due to calcified coronary lesion: Secondary | ICD-10-CM | POA: Insufficient documentation

## 2016-01-12 DIAGNOSIS — I447 Left bundle-branch block, unspecified: Secondary | ICD-10-CM | POA: Diagnosis not present

## 2016-01-12 DIAGNOSIS — R7303 Prediabetes: Secondary | ICD-10-CM | POA: Insufficient documentation

## 2016-01-12 DIAGNOSIS — G4700 Insomnia, unspecified: Secondary | ICD-10-CM | POA: Insufficient documentation

## 2016-01-12 DIAGNOSIS — N522 Drug-induced erectile dysfunction: Secondary | ICD-10-CM | POA: Insufficient documentation

## 2016-01-12 DIAGNOSIS — T43205D Adverse effect of unspecified antidepressants, subsequent encounter: Secondary | ICD-10-CM | POA: Diagnosis not present

## 2016-01-12 DIAGNOSIS — Z6841 Body Mass Index (BMI) 40.0 and over, adult: Secondary | ICD-10-CM | POA: Diagnosis not present

## 2016-01-12 DIAGNOSIS — M549 Dorsalgia, unspecified: Secondary | ICD-10-CM | POA: Diagnosis not present

## 2016-01-12 DIAGNOSIS — D689 Coagulation defect, unspecified: Secondary | ICD-10-CM | POA: Insufficient documentation

## 2016-01-12 DIAGNOSIS — M26609 Unspecified temporomandibular joint disorder, unspecified side: Secondary | ICD-10-CM | POA: Insufficient documentation

## 2016-01-12 DIAGNOSIS — G8929 Other chronic pain: Secondary | ICD-10-CM | POA: Insufficient documentation

## 2016-01-12 DIAGNOSIS — I2510 Atherosclerotic heart disease of native coronary artery without angina pectoris: Secondary | ICD-10-CM | POA: Diagnosis present

## 2016-01-12 DIAGNOSIS — G4733 Obstructive sleep apnea (adult) (pediatric): Secondary | ICD-10-CM | POA: Insufficient documentation

## 2016-01-12 HISTORY — PX: CARDIAC CATHETERIZATION: SHX172

## 2016-01-12 LAB — POCT I-STAT 3, ART BLOOD GAS (G3+)
Acid-base deficit: 3 mmol/L — ABNORMAL HIGH (ref 0.0–2.0)
Bicarbonate: 20.8 mmol/L (ref 20.0–28.0)
O2 SAT: 94 %
PCO2 ART: 34.5 mmHg (ref 32.0–48.0)
PH ART: 7.388 (ref 7.350–7.450)
TCO2: 22 mmol/L (ref 0–100)
pO2, Arterial: 73 mmHg — ABNORMAL LOW (ref 83.0–108.0)

## 2016-01-12 LAB — POCT I-STAT 3, VENOUS BLOOD GAS (G3P V)
BICARBONATE: 24.7 mmol/L (ref 20.0–28.0)
O2 SAT: 65 %
PO2 VEN: 34 mmHg (ref 32.0–45.0)
TCO2: 26 mmol/L (ref 0–100)
pCO2, Ven: 39.9 mmHg — ABNORMAL LOW (ref 44.0–60.0)
pH, Ven: 7.401 (ref 7.250–7.430)

## 2016-01-12 SURGERY — RIGHT/LEFT HEART CATH AND CORONARY ANGIOGRAPHY

## 2016-01-12 MED ORDER — CARVEDILOL 3.125 MG PO TABS: 3.1250 mg | ORAL_TABLET | Freq: Two times a day (BID) | ORAL | 3 refills | Status: DC

## 2016-01-12 MED ORDER — VERAPAMIL HCL 2.5 MG/ML IV SOLN
INTRAVENOUS | Status: AC
  Filled 2016-01-12: qty 2

## 2016-01-12 MED ORDER — FENTANYL CITRATE (PF) 100 MCG/2ML IJ SOLN
INTRAMUSCULAR | Status: AC
  Filled 2016-01-12: qty 2

## 2016-01-12 MED ORDER — FENTANYL CITRATE (PF) 100 MCG/2ML IJ SOLN
INTRAMUSCULAR | Status: DC | PRN
  Administered 2016-01-12: 50 ug via INTRAVENOUS

## 2016-01-12 MED ORDER — HEPARIN SODIUM (PORCINE) 1000 UNIT/ML IJ SOLN
INTRAMUSCULAR | Status: DC | PRN
  Administered 2016-01-12: 5000 [IU] via INTRAVENOUS

## 2016-01-12 MED ORDER — IOPAMIDOL (ISOVUE-370) INJECTION 76%
INTRAVENOUS | Status: AC
  Filled 2016-01-12: qty 100

## 2016-01-12 MED ORDER — SODIUM CHLORIDE 0.9 % IV SOLN
250.0000 mL | INTRAVENOUS | Status: DC | PRN
Start: 2016-01-12 — End: 2016-01-12

## 2016-01-12 MED ORDER — ASPIRIN 81 MG PO CHEW
81.0000 mg | CHEWABLE_TABLET | ORAL | Status: AC
  Administered 2016-01-12: 81 mg via ORAL

## 2016-01-12 MED ORDER — ONDANSETRON HCL 4 MG/2ML IJ SOLN: 4.0000 mg | Freq: Four times a day (QID) | INTRAMUSCULAR | Status: DC | PRN

## 2016-01-12 MED ORDER — ASPIRIN 81 MG PO CHEW
CHEWABLE_TABLET | ORAL | Status: AC
  Administered 2016-01-12: 81 mg via ORAL
  Filled 2016-01-12: qty 1

## 2016-01-12 MED ORDER — MIDAZOLAM HCL 2 MG/2ML IJ SOLN
INTRAMUSCULAR | Status: DC | PRN
  Administered 2016-01-12: 2 mg via INTRAVENOUS

## 2016-01-12 MED ORDER — VERAPAMIL HCL 2.5 MG/ML IV SOLN
INTRAVENOUS | Status: DC | PRN
  Administered 2016-01-12: 10 mL via INTRA_ARTERIAL

## 2016-01-12 MED ORDER — HEPARIN (PORCINE) IN NACL 2-0.9 UNIT/ML-% IJ SOLN
INTRAMUSCULAR | Status: DC | PRN
  Administered 2016-01-12: 1000 mL

## 2016-01-12 MED ORDER — SODIUM CHLORIDE 0.9 % WEIGHT BASED INFUSION: 1.0000 mL/kg/h | INTRAVENOUS | Status: DC

## 2016-01-12 MED ORDER — MIDAZOLAM HCL 2 MG/2ML IJ SOLN
INTRAMUSCULAR | Status: AC
  Filled 2016-01-12: qty 2

## 2016-01-12 MED ORDER — SODIUM CHLORIDE 0.9 % WEIGHT BASED INFUSION
3.0000 mL/kg/h | INTRAVENOUS | Status: DC
  Administered 2016-01-12: 3 mL/kg/h via INTRAVENOUS

## 2016-01-12 MED ORDER — ACETAMINOPHEN 325 MG PO TABS: 650.0000 mg | ORAL_TABLET | ORAL | Status: DC | PRN

## 2016-01-12 MED ORDER — HEPARIN (PORCINE) IN NACL 2-0.9 UNIT/ML-% IJ SOLN
INTRAMUSCULAR | Status: AC
  Filled 2016-01-12: qty 1000

## 2016-01-12 MED ORDER — LIDOCAINE HCL (PF) 1 % IJ SOLN
INTRAMUSCULAR | Status: DC | PRN
Start: 2016-01-12 — End: 2016-01-12
  Administered 2016-01-12: 2 mL
  Administered 2016-01-12: 1 mL

## 2016-01-12 MED ORDER — IOPAMIDOL (ISOVUE-370) INJECTION 76%
INTRAVENOUS | Status: DC | PRN
  Administered 2016-01-12: 60 mL via INTRA_ARTERIAL

## 2016-01-12 MED ORDER — HEPARIN SODIUM (PORCINE) 1000 UNIT/ML IJ SOLN
INTRAMUSCULAR | Status: AC
  Filled 2016-01-12: qty 1

## 2016-01-12 MED ORDER — SODIUM CHLORIDE 0.9% FLUSH: 3.0000 mL | INTRAVENOUS | Status: DC | PRN

## 2016-01-12 MED ORDER — LIDOCAINE HCL (PF) 1 % IJ SOLN
INTRAMUSCULAR | Status: AC
  Filled 2016-01-12: qty 30

## 2016-01-12 MED ORDER — SODIUM CHLORIDE 0.9% FLUSH: 3.0000 mL | Freq: Two times a day (BID) | INTRAVENOUS | Status: DC

## 2016-01-12 SURGICAL SUPPLY — 11 items
CATH BALLN WEDGE 5F 110CM (CATHETERS) ×2 IMPLANT
CATH IMPULSE 5F ANG/FL3.5 (CATHETERS) ×2 IMPLANT
DEVICE RAD COMP TR BAND LRG (VASCULAR PRODUCTS) ×2 IMPLANT
GLIDESHEATH SLEND A-KIT 6F 22G (SHEATH) ×4 IMPLANT
KIT HEART LEFT (KITS) ×2 IMPLANT
PACK CARDIAC CATHETERIZATION (CUSTOM PROCEDURE TRAY) ×2 IMPLANT
SHEATH FAST CATH BRACH 5F 5CM (SHEATH) ×2 IMPLANT
TRANSDUCER W/STOPCOCK (MISCELLANEOUS) ×2 IMPLANT
TUBING CIL FLEX 10 FLL-RA (TUBING) ×2 IMPLANT
WIRE EMERALD 3MM-J .035X260CM (WIRE) ×2 IMPLANT
WIRE HI TORQ VERSACORE-J 145CM (WIRE) ×2 IMPLANT

## 2016-01-12 NOTE — Discharge Instructions (Signed)
Radial Site Care °Refer to this sheet in the next few weeks. These instructions provide you with information about caring for yourself after your procedure. Your health care provider may also give you more specific instructions. Your treatment has been planned according to current medical practices, but problems sometimes occur. Call your health care provider if you have any problems or questions after your procedure. °WHAT TO EXPECT AFTER THE PROCEDURE °After your procedure, it is typical to have the following: °· Bruising at the radial site that usually fades within 1-2 weeks. °· Blood collecting in the tissue (hematoma) that may be painful to the touch. It should usually decrease in size and tenderness within 1-2 weeks. °HOME CARE INSTRUCTIONS °· Take medicines only as directed by your health care provider. °· You may shower 24-48 hours after the procedure or as directed by your health care provider. Remove the bandage (dressing) and gently wash the site with plain soap and water. Pat the area dry with a clean towel. Do not rub the site, because this may cause bleeding. °· Do not take baths, swim, or use a hot tub until your health care provider approves. °· Check your insertion site every day for redness, swelling, or drainage. °· Do not apply powder or lotion to the site. °· Do not flex or bend the affected arm for 24 hours or as directed by your health care provider. °· Do not push or pull heavy objects with the affected arm for 24 hours or as directed by your health care provider. °· Do not lift over 10 lb (4.5 kg) for 5 days after your procedure or as directed by your health care provider. °· Ask your health care provider when it is okay to: °¨ Return to work or school. °¨ Resume usual physical activities or sports. °¨ Resume sexual activity. °· Do not drive home if you are discharged the same day as the procedure. Have someone else drive you. °· You may drive 24 hours after the procedure unless otherwise  instructed by your health care provider. °· Do not operate machinery or power tools for 24 hours after the procedure. °· If your procedure was done as an outpatient procedure, which means that you went home the same day as your procedure, a responsible adult should be with you for the first 24 hours after you arrive home. °· Keep all follow-up visits as directed by your health care provider. This is important. °SEEK MEDICAL CARE IF: °· You have a fever. °· You have chills. °· You have increased bleeding from the radial site. Hold pressure on the site. °SEEK IMMEDIATE MEDICAL CARE IF: °· You have unusual pain at the radial site. °· You have redness, warmth, or swelling at the radial site. °· You have drainage (other than a small amount of blood on the dressing) from the radial site. °· The radial site is bleeding, and the bleeding does not stop after 30 minutes of holding steady pressure on the site. °· Your arm or hand becomes pale, cool, tingly, or numb. °  °This information is not intended to replace advice given to you by your health care provider. Make sure you discuss any questions you have with your health care provider. °  °Document Released: 05/14/2010 Document Revised: 05/02/2014 Document Reviewed: 10/28/2013 °Elsevier Interactive Patient Education ©2016 [employer] ° °

## 2016-01-12 NOTE — H&P (View-Only) (Signed)
Cardiology consultation Note    Date:  01/06/2016   ID:  Alejandro Ramos, DOB [birth year], MRN [id number]  PCP:  Alejandro Reeve, MD  Cardiologist:   Alejandro Fair, MD  Reason for consultation: Syncope, multiple coronary risk factors No chief complaint on file.   History of Present Illness:  Alejandro Ramos is a 60 y.o. male who presents for Follow-up after undergoing an echocardiogram and a nuclear stress test. Both studies were markedly abnormal showing left ventricular systolic function estimated at 25-30%by echo, 36% by the nuclear test. No reversible ischemia was seen on the nuclear study and he had a small fixed defect which was probably left bundle branch block related artifact. No significant valvular abnormalities were seen.  Today he denies ever having syncope. He insists that the previous episodes of falls were not related to loss of consciousness. Since his last appointment he has fallen and hit his head on the nightstand once. I believe this is the first time he has actually injured himself. He has a small crusted scab on his left temple. He believes these are episodes of "microsleep".  He has not had angina or worsening of his chronic dyspnea, but feels always tired.  As before, he quickly jumps from one topic to the next. He again complains about severe problems with insomnia and has daytime hypersomnolence. He does not think he has obstructive sleep apnea. However, this was reportedly identified by previous sleep test and CPAP was prescribed. He does not use it.  He again returns to discuss how so many of his health problems are related to treatment with Seroquel. He has been on this medication for possibly 10 years and is trying to wean off. He reports that he was taking it due to complaints of depression, anxiety, but especially due to insomnia. He states that in the last couple of years his weight has increased from 218 pounds up to 294 pounds due to this medication.  When I  described the fact that he has a cardiomyopathy, April the fact that he thinks it could be related to Lyme disease. He reports has been unaware of this diagnosis for at least the last 10 years and has had previous workup and treatment for it.   He denies alcohol use at this time.  Past Medical History:  Diagnosis Date  . Back pain, chronic 12/03/2011  . 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 Sleep apnea 12/03/2011   Has a C-PAP machine, but does not use it.  . 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  Current Medications: Outpatient Medications Prior to Visit  Medication Sig Dispense Refill  . gabapentin (NEURONTIN) 600 MG tablet Take 600 mg by mouth 3 (three) times daily.    . ramelteon (ROZEREM) 8 MG tablet Take 8 mg by mouth at bedtime.     No facility-administered medications prior to visit.      Allergies:   Review of patient's allergies indicates no known allergies.   Social History   Social History  . Marital status: Divorced    Spouse name: N/A  . Number of children: N/A  . Years of education: N/A   Social History Main Topics  . Smoking status: Never Smoker  . Smokeless tobacco: Never Used  . Alcohol use No  . Drug use: No  . Sexual activity: Not Currently   Other Topics Concern  . None   Social History Narrative  . None  Family History:  The patient's family history includes Paranoid behavior in his father.   ROS:   Please see the history of present illness.    ROS All other systems reviewed and are negative.   PHYSICAL EXAM:   VS:  BP (!) 141/91   Pulse 98   Ht 5\' 10"  (1.778 m)   Wt 289 lb 3.2 oz (131.2 kg)   BMI 41.50 kg/m    GEN: Well nourished, well developed, in no acute distress  HEENT: normal  Neck: no JVD, carotid bruits, or masses Cardiac: Paradoxically split S2, borderline tachycardia, RRR; no murmurs, rubs, or gallops,no edema  Respiratory:  clear to auscultation  bilaterally, normal work of breathing GI: soft, nontender, nondistended, + BS MS: no deformity or atrophy  Skin: warm and dry, no rash Neuro:  Alert and Oriented x 3, Strength and sensation are intact Psych: euthymic mood, full affect  Wt Readings from Last 3 Encounters:  01/06/16 289 lb 3.2 oz (131.2 kg)  12/31/15 287 lb (130.2 kg)  12/10/15 287 lb (130.2 kg)      Studies/Labs Reviewed:   EKG:  EKG is not ordered today.   Recent Labs: 12/09/2015: BUN 14; Creatinine, Ser 0.91; Hemoglobin 15.8; Platelets 241; Potassium 3.9; Sodium 137   Lipid Panel Total cholesterol 223, HDL 65, triglycerides 93, LDL 139 (01/22/2015), hemoglobin A1c 6%  Additional studies/ records that were reviewed today include:  Brief records from emergency room evaluation and records from primary care physician    ASSESSMENT:    1. Chronic combined systolic and diastolic heart failure (HCC)   2. Cardiomyopathy (HCC)   3. LBBB (left bundle branch block)   4. Hyperlipidemia   5. Morbid obesity due to excess calories (HCC)   6. Pre-diabetes   7. OSA (obstructive sleep apnea)   8. Syncope, unspecified syncope type   9. Pre-procedure lab exam   10. Other fatigue   11. Blood clotting disorder (HCC)      PLAN:  In order of problems listed above:  1. CHF: It appears likely that his dyspnea is related to heart failure in addition to obesity and deconditioning. We'll plan right left heart catheterization and will start treatment with an angiotensin receptor blocker. Will hold off diuretics and beta blockers until after his catheterization and assessment of his volume status. Clinically not overtly hypervolemic. 2. Cardiomyopathy: Coronary calcifications were incidentally noted on a previous CT scan in 2016. Overall the appearance of his echo and nuclear test suggests nonischemic cardiomyopathy, but he could well have widespread CAD or dominant left main stenosis. If LVEF remains this low after appropriate  revascularization and/or medical therapy, will have to discuss defibrillator. 3. LBBB: This is a chronic abnormality per his report. If he does not respond well to medical therapy for heart failure, option for CRT. 4. Hyperlipidemia: Ideally LDL cholesterol less than 161100 mg/dL, triglycerides less than 70 if her identify significant coronary obstruction. Will get n updated lipid profile. 5. Morbid obesity: This is clearly the cause for his borderline diabetes mellitus and is contributing significantly to his hyperlipidemia as well as other possible medical problems. Weight loss is critical for his long-term prognosis. I agree that the Seroquel may have a lot to do with his weight gain and ideally an alternative agent defined to deal with his psychiatric and sleep issues. Told him that I am not the right physician to make new recommendations for sleep aids. 6. Pre-diabetes: Try to avoid atypical antipsychotics that worsen glucose tolerance, lose  weight, exercise more frequently, improved diet. A1c 6%- pharmacological therapy not yet indicated (except maybe metformin?). 7. Reported history of obstructive sleep apnea: If this is indeed confirmed, CPAP treatment would be critical. Will discuss with him once the results of his right heart catheterization are available. 8. Syncope: It is possible that he had some episodes of vasovagal syncope, since nausea was a prominent complaint. I wonder whether his loss of consciousness may be uncontrollable hypersomnolence in a patient with untreated sleep apnea and chronic insomnia. One cannot completely exclude bradyarrhythmic syncope since he has a left bundle branch block. The most urgent concern is that of possible ventricular tachycardia. He may need to have an ICD. This is another decision that we'll have to wait until after cardiac catheterization and treatment.  Right and left heart catheterization procedure has been fully reviewed with the patient and informed  consent has been obtained.     Medication Adjustments/Labs and Tests Ordered: Current medicines are reviewed at length with the patient today.  Concerns regarding medicines are outlined above.  Medication changes, Labs and Tests ordered today are listed in the Patient Instructions below. Patient Instructions  Dr Royann Shivers has recommended making the following medication changes: 1. START Valsartan 160 mg - take 1 tablet by mouth daily  Your physician has requested that you have a right and left cardiac catheterization with Dr Royann Shivers on 01/12/16. Cardiac catheterization is used to diagnose and/or treat various heart conditions. Doctors may recommend this procedure for a number of different reasons. The most common reason is to evaluate chest pain. Chest pain can be a symptom of coronary artery disease (CAD), and cardiac catheterization can show whether plaque is narrowing or blocking your heart's arteries. This procedure is also used to evaluate the valves, as well as measure the blood flow and oxygen levels in different parts of your heart. For further information please visit [email or web address]   Following your catheterization, you will not be allowed to drive for 3 days.  No lifting, pushing, or pulling greater that 10 pounds is allowed for 1 week.  You will be required to have the following tests prior to the procedure:  1. Blood work - the blood work can be done no more than 7 days prior to the procedure. It can be done at any [facility] lab. There is one downstairs on the first floor of this building and one in the [facility] building [phone]).   Please hold the following medication(s) on the day of your procedure: Valsartan    Signed, Alejandro Fair, MD  01/06/2016 1:06 PM    [facility] [address] [place], [place], [place]  [address] Phone: [phone]; Fax: [phone]

## 2016-01-12 NOTE — Op Note (Signed)
CARDIAC CATHETERIZATION REPORT   Procedures performed:  1. Right and left heart catheterization  2. Selective coronary angiography  3. Moderate sedation  Reason for procedure:  Congestive heart failure   Procedure performed by: Thurmon FairMihai Amire Leazer, MD, [facility]  Complications: none   Estimated blood loss: less than 5 mL   History:  60 year old with longstanding LBBB and newly diagnosed severe cardiomyopathy with LVEF 25-30%. He does not have angina or major ischemic defects on nuclear perfusion imaging, but does have CT evidence of coronary calcification.  Consent: The risks, benefits, and details of the procedure were explained to the patient. Risks including death, MI, stroke, bleeding, limb ischemia, renal failure and allergy were described and accepted by the patient. Informed written consent was obtained prior to proceeding.  Technique: The patient was brought to the cardiac catheterization laboratory in the fasting state. He was prepped and draped in the usual sterile fashion. A right antecubital IV was exchanged for a venous sheath. Local anesthesia with 1% lidocaine was administered to the right wrist area. Using the modified Seldinger technique a 6 French right radial artery sheath was introduced without difficulty. Under fluoroscopic guidance, right heart pressures were measured using a 16F Swan-Ganz catheter and O2 sat samples were simultaneously drawn from the main PA and the radial artery. Then, using 5 JamaicaFrench JL3.5 and JR  catheters, selective cannulation of the left coronary artery, right coronary artery and left ventricle were respectively performed. Several coronary angiograms in a variety of projections were recorded. Left ventricular pressure and a pull back to the aorta were recorded. No immediate complications occurred. At the end of the procedure, all catheters were removed. After the procedure, hemostasis will be achieved with manual pressure.  Contrast used: 60 mL  Omnipaque  During this procedure the patient was administered a total of Versed 2 mg and Fentanyl 50 mcg to achieve and maintain moderate conscious sedation.  The patient's heart rate, blood pressure, and oxygen saturation were monitored continuously during the procedure. The period of conscious sedation was 31 minutes, of which I was present face-to-face 100% of this time.  Hemodynamic findings:  Aortic pressure 115/79 (mean 95) mm Hg   Left ventricle 124/8 with end-diastolic pressure of 12 mm Hg  PA wedge pressure a wave 18, v wave 14 (mean 14) mm Hg  Pulmonary artery 35/20 (mean 26) mm Hg  Right ventricle 30/2 with an end-diastolic pressure of 8 mm Hg  Right atrium a wave 11, v wave 6 (mean 6) mm Hg  Cardiac output is 4.9 L per minute (cardiac index 2 L per minute per meter sq)    Angiographic Findings:  1. The left main coronary artery has visible calcific atherosclerosis, but only 20-30% in severity. It bifurcates in the usual fashion into the left anterior descending artery and left circumflex coronary artery.  2. The left anterior descending artery is a relatively short vessel that does not reach the apex and generates 2 major diagonal branches. There is evidence of extensive luminal irregularities and mild calcification. The proximal LAD has 30-40% stenosis. No hemodynamically meaningful stenoses are seen. 3. The left circumflex coronary artery is a medium-size vessel non dominant vessel that generates two major oblique marginal arteries. There is evidence of mild luminal irregularities and mild calcification. No hemodynamically meaningful stenoses are seen. 4. The right coronary artery is a very large-size dominant vessel that generates a trifurcating posterior lateral ventricular system as well as a long PDA that reaches the apex. There is evidence  of extensive luminal irregularities and mild calcification, but no more than 20% stenosis. No hemodynamically meaningful stenoses are  seen.  5. The left ventricle is not injected. There is no aortic valve stenosis by pullback. The left ventricular end-diastolic pressure is 12 mm Hg.    IMPRESSIONS:  Minor coronary atherosclerosis Compensated dilated cardiomyopathy with borderline high left and right atrial filling pressures and borderline low cardiac output.   RECOMMENDATION:  Add low dose carvedilol. Transition from valsartan to sacubitril-valsartan. Gradually titrate the medication doses to maximum tolerated amount.  Reevaluate LVEF in about 3 months. Refer for CRT-D if EF remains <35%. Treat coronary risk factors. Target LDL<70 due to high risk of disease progression. Attempt weight loss and gradual increase in moderate aerobic exercise.    Thurmon Fair, MD, [facility] [phone] office [phone] pager

## 2016-01-12 NOTE — Interval H&P Note (Signed)
History and Physical Interval Note:  01/12/2016 12:21 PM  Alejandro Ramos  has presented today for surgery, with the diagnosis of cp  The various methods of treatment have been discussed with the patient and family. After consideration of risks, benefits and other options for treatment, the patient has consented to  Procedure(s): Right/Left Heart Cath and Coronary Angiography (N/A) as a surgical intervention .  The patient's history has been reviewed, patient examined, no change in status, stable for surgery.  I have reviewed the patient's chart and labs.  Questions were answered to the patient's satisfaction.     Clois Treanor

## 2016-01-13 ENCOUNTER — Telehealth: Payer: Self-pay | Admitting: Cardiovascular Disease

## 2016-01-13 ENCOUNTER — Encounter (HOSPITAL_COMMUNITY): Payer: Self-pay | Admitting: Cardiovascular Disease

## 2016-01-13 NOTE — Telephone Encounter (Signed)
Left msg for pt to call - not sure what the "heart failure packet" is. Will route to Dr. Royann Shivers to see if this was discussed yesterday at discharge

## 2016-01-13 NOTE — Telephone Encounter (Signed)
2nd msg left for patient - goes straight to VM. He will need to be transferred to triage when he calls back.

## 2016-01-13 NOTE — Telephone Encounter (Signed)
Follow up ° ° ° °Pt verbalized that he is returning call for rn °

## 2016-01-13 NOTE — Telephone Encounter (Signed)
New message     Pt verbalized that he has not received his heart failure package and he is requesting one. Can it be sent by mail?  He wants to know the type of heart failure that he has  Leave a vm if he don't answer

## 2016-01-14 NOTE — Telephone Encounter (Signed)
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 Not sure what the packet he refers to might be, but please tell him we can give him educational materials when he comes in to see our pharmD for Entresto. Thanks [employer]

## 2016-01-14 NOTE — Telephone Encounter (Signed)
Left msg for patient that we have information requested and requested patient to call back.

## 2016-01-15 NOTE — Telephone Encounter (Signed)
Pt has a follow up appointment 01-21-16.

## 2016-01-21 ENCOUNTER — Ambulatory Visit (INDEPENDENT_AMBULATORY_CARE_PROVIDER_SITE_OTHER): Payer: BLUE CROSS/BLUE SHIELD | Admitting: Physician Assistant

## 2016-01-21 ENCOUNTER — Encounter: Payer: Self-pay | Admitting: Physician Assistant

## 2016-01-21 VITALS — BP 100/70 | HR 96 | Ht 70.5 in | Wt 280.6 lb

## 2016-01-21 DIAGNOSIS — I5042 Chronic combined systolic (congestive) and diastolic (congestive) heart failure: Secondary | ICD-10-CM

## 2016-01-21 DIAGNOSIS — I428 Other cardiomyopathies: Secondary | ICD-10-CM

## 2016-01-21 DIAGNOSIS — I429 Cardiomyopathy, unspecified: Secondary | ICD-10-CM | POA: Diagnosis not present

## 2016-01-21 LAB — BASIC METABOLIC PANEL WITH GFR
BUN: 19 mg/dL (ref 7–25)
CALCIUM: 9.6 mg/dL (ref 8.6–10.3)
CO2: 25 mmol/L (ref 20–31)
Chloride: 104 mmol/L (ref 98–110)
Creat: 0.98 mg/dL (ref 0.70–1.25)
GFR, EST NON AFRICAN AMERICAN: 83 mL/min (ref >=60)
Glucose, Bld: 52 mg/dL — ABNORMAL LOW (ref 65–99)
Potassium: 4.7 mmol/L (ref 3.5–5.3)
SODIUM: 141 mmol/L (ref 135–146)

## 2016-01-21 MED ORDER — SACUBITRIL-VALSARTAN 24-26 MG PO TABS: 1.0000 | ORAL_TABLET | Freq: Two times a day (BID) | ORAL | 0 refills | Status: DC

## 2016-01-21 NOTE — Patient Instructions (Addendum)
Medications:  START Entresto 24-26 mg (1 tab) twice daily.  Labwork:  Your physician recommends that you return for lab work today--BMET.   Other Instructions:  --You have been referred to a Nutritionist. They will call to schedule.   --Try to find a personal trainer and make sure they are aware of your medical conditions and limitations.  --Start a low sodium diet and track daily weights. Your physician recommends that you weigh, daily, at the same time every day, and in the same amount of clothing. Please record your daily weights on the handout provided and bring it to your next appointment.    Low-Sodium Eating Plan Sodium raises blood pressure and causes water to be held in the body. Getting less sodium from food will help lower your blood pressure, reduce any swelling, and protect your heart, liver, and kidneys. We get sodium by adding salt (sodium chloride) to food. Most of our sodium comes from canned, boxed, and frozen foods. Restaurant foods, fast foods, and pizza are also very high in sodium. Even if you take medicine to lower your blood pressure or to reduce fluid in your body, getting less sodium from your food is important. WHAT IS MY PLAN? Most people should limit their sodium intake to 2,300 mg a day. Your health care provider recommends that you limit your sodium intake to __________ a day.  WHAT DO I NEED TO KNOW ABOUT THIS EATING PLAN? For the low-sodium eating plan, you will follow these general guidelines:  Choose foods with a % Daily Value for sodium of less than 5% (as listed on the food label).   Use salt-free seasonings or herbs instead of table salt or sea salt.   Check with your health care provider or pharmacist before using salt substitutes.   Eat fresh foods.  Eat more vegetables and fruits.  Limit canned vegetables. If you do use them, rinse them well to decrease the sodium.   Limit cheese to 1 oz (28 g) per day.   Eat lower-sodium  products, often labeled as "lower sodium" or "no salt added."  Avoid foods that contain monosodium glutamate (MSG). MSG is sometimes added to Cong[place]inese food and some canned foods.  Check food labels (Nutrition Facts labels) on foods to learn how much sodium is in one serving.  Eat more home-cooked food and less restaurant, buffet, and fast food.  When eating at a restaurant, ask that your food be prepared with less salt, or no salt if possible.  HOW DO I READ FOOD LABELS FOR SODIUM INFORMATION? The Nutrition Facts label lists the amount of sodium in one serving of the food. If you eat more than one serving, you must multiply the listed amount of sodium by the number of servings. Food labels may also identify foods as:  Sodium free--Less than 5 mg in a serving.  Very low sodium--35 mg or less in a serving.  Low sodium--140 mg or less in a serving.  Light in sodium--50% less sodium in a serving. For example, if a food that usually has 300 mg of sodium is changed to become light in sodium, it will have 150 mg of sodium.  Reduced sodium--25% less sodium in a serving. For example, if a food that usually has 400 mg of sodium is changed to reduced sodium, it will have 300 mg of sodium. WHAT FOODS CAN I EAT? Grains Low-sodium cereals, including oats, puffed wheat and rice, and shredded wheat cereals. Low-sodium crackers. Unsalted rice and pasta. Lower-sodium bread.  Vegetables Frozen or fresh vegetables. Low-sodium or reduced-sodium canned vegetables. Low-sodium or reduced-sodium tomato sauce and paste. Low-sodium or reduced-sodium tomato and vegetable juices.  Fruits Fresh, frozen, and canned fruit. Fruit juice.  Meat and Other Protein Products Low-sodium canned tuna and salmon. Fresh or frozen meat, poultry, seafood, and fish. Lamb. Unsalted nuts. Dried beans, peas, and lentils without added salt. Unsalted canned beans. Homemade soups without salt. Eggs.  Dairy Milk. Soy milk.  Ricotta cheese. Low-sodium or reduced-sodium cheeses. Yogurt.  Condiments Fresh and dried herbs and spices. Salt-free seasonings. Onion and garlic powders. Low-sodium varieties of mustard and ketchup. Fresh or refrigerated horseradish. Lemon juice.  Fats and Oils Reduced-sodium salad dressings. Unsalted butter.  Other Unsalted popcorn and pretzels.  The items listed above may not be a complete list of recommended foods or beverages. Contact your dietitian for more options. WHAT FOODS ARE NOT RECOMMENDED? Grains Instant hot cereals. Bread stuffing, pancake, and biscuit mixes. Croutons. Seasoned rice or pasta mixes. Noodle soup cups. Boxed or frozen macaroni and cheese. Self-rising flour. Regular salted crackers. Vegetables Regular canned vegetables. Regular canned tomato sauce and paste. Regular tomato and vegetable juices. Frozen vegetables in sauces. Salted [place] fries. Olives. Rosita Fire. Relishes. Sauerkraut. Salsa. Meat and Other Protein Products Salted, canned, smoked, spiced, or pickled meats, seafood, or fish. Bacon, ham, sausage, hot dogs, corned beef, chipped beef, and packaged luncheon meats. Salt pork. Jerky. Pickled herring. Anchovies, regular canned tuna, and sardines. Salted nuts. Dairy Processed cheese and cheese spreads. Cheese curds. Blue cheese and cottage cheese. Buttermilk.  Condiments Onion and garlic salt, seasoned salt, table salt, and sea salt. Canned and packaged gravies. Worcestershire sauce. Tartar sauce. Barbecue sauce. Teriyaki sauce. Soy sauce, including reduced sodium. Steak sauce. Fish sauce. Oyster sauce. Cocktail sauce. Horseradish that you find on the shelf. Regular ketchup and mustard. Meat flavorings and tenderizers. Bouillon cubes. Hot sauce. Tabasco sauce. Marinades. Taco seasonings. Relishes. Fats and Oils Regular salad dressings. Salted butter. Margarine. Ghee. Bacon fat.  Other Potato and tortilla chips. Corn chips and puffs. Salted popcorn  and pretzels. Canned or dried soups. Pizza. Frozen entrees and pot pies.  The items listed above may not be a complete list of foods and beverages to avoid. Contact your dietitian for more information.   This information is not intended to replace advice given to you by your health care provider. Make sure you discuss any questions you have with your health care provider.   Document Released: 10/01/2001 Document Revised: 05/02/2014 Document Reviewed: 02/13/2013 Elsevier Interactive Patient Education 2016 Elsevier Inc.   Follow-Up:  Please keep your upcoming appointment on 02/17/16 at 11:30 am with Dr. Royann Shivers.  If you need a refill on your cardiac medications before your next appointment, please call your pharmacy.

## 2016-01-21 NOTE — Progress Notes (Signed)
Cardiology Office Note   Date:  01/21/2016   ID:  Alejandro Ramos, DOB [birth year], MRN [id number]  PCP:  Emeterio Reeve, MD  Cardiologist:  Dr Genene Churn, Bjorn Loser, PA-C   History of Present Illness: Alejandro Ramos is a 60 y.o. 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 Lex MV & echo abnl>>cath 09/19 w/ mild CAD w/ high atrial filling pressures and low-nl CO>>Coreg added, change ARB>Entresto as OP.  Alejandro Ramos presents for post hospital evaluation.  Since discharge from the hospital, he has done very well. He has resumed water aerobics. His shortness of breath is at baseline. He is trying to eat a healthy diet. He is not weighing himself daily but says he can start. He denies orthopnea or PND.   He wonders if any additional testing needs to be done. He is concerned about genetics playing a role.  He wonders if there is anything else he can do to get himself healthier and deal with this condition.   Past Medical History:  Diagnosis Date  . Back pain, chronic 12/03/2011  . 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 Sleep apnea 12/03/2011   Has a C-PAP machine, but does not use it.  . 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 CARDIAC CATHETERIZATION N/A 01/12/2016   Procedure: Right/Left Heart Cath and Coronary Angiography;  Surgeon: Thurmon Fair, MD; RCA 15%, LM 25-30%, LVEDP 12 mm Hg, PWCP mean 14 mm Hg, PA 35/20, mean 26 mm Hg, CO 4.9 L/min, CI 2 L/min (sq)    Current Outpatient Prescriptions  Medication Sig Dispense Refill  . acetaminophen (TYLENOL) 500 MG tablet Take 1,000 mg by mouth every 6 (six) hours as needed for moderate pain or headache.    . b complex vitamins tablet Take 1 tablet by mouth daily.    Marland Kitchen Bioflavonoid Products (ESTER C PO) Take  500 mg by mouth daily.    . carvedilol (COREG) 3.125 MG tablet Take 1 tablet (3.125 mg total) by mouth 2 (two) times daily with a meal. 60 tablet 3  . Cholecalciferol (VITAMIN D3) 5000 units CAPS Take 5,000 Units by mouth daily.    . Coenzyme Q10 (CO Q 10 PO) Take 500 mg by mouth daily.    . finasteride (PROSCAR) 5 MG tablet Take 5 mg by mouth daily.   4  . gabapentin (NEURONTIN) 600 MG tablet Take 600-1,800 mg by mouth See admin instructions. Take 600 mg by mouth daily and take 1800 mg by mouth at bedtime    . milk thistle 175 MG tablet Take 175 mg by mouth daily.    . Multiple Vitamins-Minerals (MENS 50+ MULTI VITAMIN/MIN PO) Take 1 tablet by mouth daily.    Marland Kitchen omega-3 acid ethyl esters (LOVAZA) 1 g capsule Take 2 g by mouth daily. 2,000 MG DAILY    . ondansetron (ZOFRAN) 4 MG tablet TAKE 1TAB BY MOUTH 4 TIMES DAILY  5  . QUEtiapine (SEROQUEL) 25 MG tablet Take 25 mg by mouth 3 (three) times daily as needed (for nausea). SOMETIMES 4 TIMES A DAY    . ramelteon (ROZEREM) 8 MG tablet Take 8 mg by mouth at bedtime.    . temazepam (RESTORIL) 15 MG capsule Take 15 mg by mouth at bedtime.     Marland Kitchen  Testosterone (AXIRON) 30 MG/ACT SOLN PLACE (30 MG) ONTO THE SKIN DAILY    . valsartan (DIOVAN) 160 MG tablet Take 1 tablet (160 mg total) by mouth daily. 30 tablet 11  . sacubitril-valsartan (ENTRESTO) 24-26 MG Take 1 tablet by mouth 2 (two) times daily. 60 tablet 0   No current facility-administered medications for this visit.     Allergies:   Review of patient's allergies indicates no known allergies.    Social History:  The patient  reports that he has never smoked. He has never used smokeless tobacco. He reports that he does not drink alcohol or use drugs.   Family History:  The patient's family history includes Arrhythmia in his maternal grandfather and mother; Congestive Heart Failure in his father; Heart attack in his maternal grandmother; Heart attack (age of onset: 7679) in his paternal grandfather;  Heart attack (age of onset: 6293) in his paternal grandmother; Paranoid behavior in his father.    ROS:  Please see the history of present illness. All other systems are reviewed and negative.    PHYSICAL EXAM: VS:  BP 100/70   Pulse 96   Ht 5' 10.5" (1.791 m)   Wt 280 lb 9.6 oz (127.3 kg)   BMI 39.69 kg/m  , BMI Body mass index is 39.69 kg/m. GEN: Well nourished, well developed, male in no acute distress  HEENT: normal for age  Neck: no JVD, no carotid bruit, no masses Cardiac: RRR; 2/6 murmur, no rubs, or gallops Respiratory:  clear to auscultation bilaterally, normal work of breathing GI: soft, nontender, nondistended, + BS MS: no deformity or atrophy; no edema; distal pulses are 2+ in all 4 extremities   Skin: warm and dry, no rash Neuro:  Strength and sensation are intact Psych: euthymic mood, full affect   EKG:  EKG is not ordered today.  CATH: 09/19 Hemodynamic findings: Aortic pressure 115/79 (mean 95) mm Hg Left ventricle 124/8 with end-diastolic pressure of 12 mm Hg PA wedge pressure a wave 18, v wave 14 (mean 14) mm Hg Pulmonary artery 35/20 (mean 26) mm Hg Right ventricle 30/2 with an end-diastolic pressure of 8 mm Hg Right atrium a wave 11, v wave 6 (mean 6) mm Hg Cardiac output is 4.9 L per minute (cardiac index 2 L per minute per meter sq) IMPRESSIONS:   Ost RCA to Dist RCA lesion, 15 %stenosed.  Ost LM to LM lesion, 25 %stenosed.  LM lesion, 30 %stenosed. Minor coronary atherosclerosis Compensated dilated cardiomyopathy with borderline high left and right atrial filling pressures and borderline low cardiac output.  RECOMMENDATION:  Add low dose carvedilol. Transition from valsartan to sacubitril-valsartan. Gradually titrate the medication doses to maximum tolerated amount.  Reevaluate LVEF in about 3 months. Refer for CRT-D if EF remains <35%. Treat coronary risk factors. Target LDL<70 due to high risk of disease progression. Attempt weight loss  and gradual increase in moderate aerobic exercise.   ECHO: 09/05 - Left ventricle: The cavity size was normal. Wall thickness was   increased in a pattern of mild LVH. Systolic function was   severely reduced. The estimated ejection fraction was in the   range of 25% to 30%. Doppler parameters are consistent with   abnormal left ventricular relaxation (grade 1 diastolic   dysfunction). Impressions: - Markedly depressed LV function  Myoview 09/07  The left ventricular ejection fraction is moderately decreased (30-44%).  Nuclear stress EF: 36%.  There was no ST segment deviation noted during stress.  Defect 1: There  is a small defect of mild severity present in the mid anteroseptal location.  This is a low risk study.  Low risk stress nuclear study with normal perfusion and moderately reduced left ventricular global systolic function. Findings are consistent with nonischemic cardiomyopathy.  Recent Labs: 01/06/2016: BUN 21; Creat 0.85; Hemoglobin 15.9; Platelets 208; Potassium 4.5; Sodium 138; TSH 3.87    Lipid Panel    Component Value Date/Time   CHOL 181 12/10/2015 1131   TRIG 106 12/10/2015 1131   HDL 68 12/10/2015 1131   CHOLHDL 2.7 12/10/2015 1131   VLDL 21 12/10/2015 1131   LDLCALC 92 12/10/2015 1131     Wt Readings from Last 3 Encounters:  01/21/16 280 lb 9.6 oz (127.3 kg)  01/12/16 290 lb (131.5 kg)  01/06/16 289 lb 3.2 oz (131.2 kg)     Other studies Reviewed: Additional studies/ records that were reviewed today include: Office notes, hospital records and testing.  ASSESSMENT AND PLAN:  1.  Chronic combined systolic and diastolic CHF: His weight is below his previous weight. His volume status is good by exam. He is not currently on a diuretic and because his blood pressures borderline, I will not add one.   He is encouraged to continue a low sodium, heart healthy diet. We will refer him to a nutritionist to help him maintain this. If he wishes to get a  personal trainer to help him increase his activity, that would be a good idea. He is encouraged to continue water aerobics. If he gets a Systems analyst, he needs to make sure they understand his limitations because of his various medical conditions.  He is currently on valsartan. The plan was to convert him to [facility] at this office visit. His blood pressure is low but stable. We will check a BMET today, and start low-dose Entresto. Tresa Endo, the pharmacist is assisting with this. He understands the need to stop the losartan. Because he was on an ARB, not an ACE inhibitor, we can switch him directly and he can start the [facility] tomorrow.  He is to continue his carvedilol at the current dose. He will follow-up in 2 weeks with a blood pressure check, and then with Dr. Royann Shivers.  2. Nonischemic cardiomyopathy: I advised him that I did not see any need for him to go to [place] or another outside facility for additional testing. There is no familial pattern of heart failure. His father has congestive heart failure but he is 32 and this is most likely related to aging. His TSH was normal. Idiopathic cardiomyopathy is the most likely. We will continue to treat him aggressively.   Current medicines are reviewed at length with the patient today.  The patient has concerns regarding medicines. Concerns were addressed  The following changes have been made:  Change valsartan to [facility]  Labs/ tests ordered today include:   Orders Placed This Encounter  Procedures  . BASIC METABOLIC PANEL WITH GFR  . Ambulatory referral to Nutrition and Diabetic Education     Disposition:   FU As scheduled  Signed, Leanna Battles  01/21/2016 3:56 PM    [facility] Phone: [phone]; Fax: [phone]  This note was written with the assistance of speech recognition software. Please excuse any transcriptional errors.

## 2016-01-25 ENCOUNTER — Encounter: Payer: Self-pay | Admitting: Family Medicine

## 2016-01-25 ENCOUNTER — Ambulatory Visit (INDEPENDENT_AMBULATORY_CARE_PROVIDER_SITE_OTHER): Payer: BLUE CROSS/BLUE SHIELD | Admitting: Family Medicine

## 2016-01-25 VITALS — BP 110/90 | HR 95 | Temp 97.8°F | Ht 70.5 in | Wt 278.0 lb

## 2016-01-25 DIAGNOSIS — F5104 Psychophysiologic insomnia: Secondary | ICD-10-CM | POA: Diagnosis not present

## 2016-01-25 DIAGNOSIS — E785 Hyperlipidemia, unspecified: Secondary | ICD-10-CM | POA: Diagnosis not present

## 2016-01-25 DIAGNOSIS — G4733 Obstructive sleep apnea (adult) (pediatric): Secondary | ICD-10-CM

## 2016-01-25 DIAGNOSIS — R7303 Prediabetes: Secondary | ICD-10-CM

## 2016-01-25 DIAGNOSIS — I5042 Chronic combined systolic (congestive) and diastolic (congestive) heart failure: Secondary | ICD-10-CM | POA: Diagnosis not present

## 2016-01-25 NOTE — Progress Notes (Signed)
Pre visit review using our clinic review tool, if applicable. No additional management support is needed unless otherwise documented below in the visit note. 

## 2016-01-25 NOTE — Progress Notes (Signed)
Subjective:     Patient ID: Alejandro SaucierJohn D Donegan, male   DOB: 09-16-1955, 60 y.o.   MRN: [id number]  HPI Patient seen to establish care.  He's been followed [facility] by [facility] and also by psychiatry and is also followed by cardiology. His chronic problems include history of obesity, chronic insomnia, hyperlipidemia, prediabetes, obstructive sleep apnea, combined systolic and diastolic heart failure.  Chronic insomnia. Patient states he's had insomnia issues as long as he can remember. His been on multiple medications previously which did not work including trazodone, tricyclic antidepressants, doxepin, Ambien. He is currently treated with Seroquel per psychiatrist but has had tremendous weight gain of estimated 100 pounds over 4 years. Has recently been tapering off Seroquel but has had severe nausea with tapering and is currently taking 75 mg 3 times a day. He's had difficulty tapering below this amount. He states he has fairly chronic nausea. He took Zyprexa prior to that for almost 3 years but had weight gain with that as well.  The Seroquel does seem to work well for his insomnia.  No hx of bipolar.  He is aware of general principles of good sleep hygiene.    He has long-standing history of psychotherapy and has done things like biofeedback without relief. He has tried melatonin without relief. No alcohol use. No caffeine use. He does not have any history of bipolar disorder and states that he was being treated with Zyprexa and Seroquel per psychiatry strictly for his sleep disorder. He's had previous sleep studies estimated 7 years ago. No reported restless leg syndrome.  He has history of combined systolic and diastolic heart failure and his been worked up thoroughly by cardiology. Previous catheterization did not show significant obstructive coronary disease. He's been referred to nutrition by cardiology.  He has not had any recent lab work. He had thyroid functions about a year ago that  were normal.  Past Medical History:  Diagnosis Date  . Back pain, chronic 12/03/2011  . 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 Sleep apnea 12/03/2011   Has a C-PAP machine, but does not use it.  . 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 CARDIAC CATHETERIZATION N/A 01/12/2016   Procedure: Right/Left Heart Cath and Coronary Angiography;  Surgeon: Thurmon FairMihai Croitoru, MD; RCA 15%, LM 25-30%, LVEDP 12 mm Hg, PWCP mean 14 mm Hg, PA 35/20, mean 26 mm Hg, CO 4.9 L/min, CI 2 L/min (sq)    reports that he has never smoked. He has never used smokeless tobacco. He reports that he does not drink alcohol or use drugs. family history includes Arrhythmia in his maternal grandfather and mother; Congestive Heart Failure in his father; Heart attack in his maternal grandmother; Heart attack (age of onset: 779) in his paternal grandfather; Heart attack (age of onset: 2893) in his paternal grandmother; Paranoid behavior in his father. No Known Allergies   Review of Systems  Constitutional: Positive for fatigue.  Eyes: Negative for visual disturbance.  Respiratory: Negative for cough, chest tightness and shortness of breath.   Cardiovascular: Negative for chest pain, palpitations and leg swelling.  Endocrine: Negative for polydipsia and polyuria.  Genitourinary: Negative for dysuria.  Neurological: Negative for dizziness, syncope, weakness, light-headedness and headaches.  Psychiatric/Behavioral: Positive for sleep disturbance. Negative for agitation and suicidal ideas. The patient is nervous/anxious.        Objective:   Physical Exam  Constitutional: He is oriented to person, place, and  time. He appears well-developed and well-nourished. No distress.  Alert, cooperative, somewhat anxious appearing 60 year old male  Neck: Neck supple. No thyromegaly present.  Cardiovascular: Normal rate and regular rhythm.   Pulmonary/Chest: Effort  normal and breath sounds normal. No respiratory distress. He has no wheezes. He has no rales.  Musculoskeletal: He exhibits no edema.  Neurological: He is alert and oriented to person, place, and time. No cranial nerve deficit.  Psychiatric: His behavior is normal. Judgment and thought content normal.       Assessment:     #1 chronic insomnia. He's had good relief with Seroquel but had tremendous weight gain with this medication and is wishing to come completely off- if possible  #2 weight gain probably largely related to medication  #3 chronic nausea possibly related to tapering off Seroquel. He states he has seen GI.  Not clear if gastroparesis has been evaluated for.  #4 history of reported prediabetes  #5 Combined systolic and diastolic heart failure with non-ischemic cardiomyopathy.    Plan:     -Set up complete physical -Obtain records from psychiatrist -Patient is requesting referral to tertiary center such as [facility] for further evaluation regarding his chronic insomnia and possible tapering of Seroquel -Encouraged to keep follow-up with nutritionist -Continue current medications for now  Kristian Covey MD [facility]

## 2016-01-26 ENCOUNTER — Telehealth: Payer: Self-pay | Admitting: Family Medicine

## 2016-01-26 NOTE — Telephone Encounter (Signed)
Called to make appointment for patient for his referral was told Pt has to be referred to physiatrist  DR Nunzio Cobbs  Can not see pt for - Chronic insomnia Dr. Resa Miner. Mindi Junker MD   [place] Address: [address], [place], [place] [address] Phone: [phone] Please advise

## 2016-01-26 NOTE — Telephone Encounter (Signed)
Find out if there is someone in the [facility] who can evaluate this patient with chronic insomnia.

## 2016-02-01 ENCOUNTER — Telehealth: Payer: Self-pay | Admitting: Cardiovascular Disease

## 2016-02-01 DIAGNOSIS — I5022 Chronic systolic (congestive) heart failure: Secondary | ICD-10-CM | POA: Diagnosis not present

## 2016-02-01 NOTE — Telephone Encounter (Signed)
Received records from [facility] for appointment on 02/17/16 with Dr Royann Shivers.  Records given to [facility] (medical records) for Dr Croitoru's schedule on 02/17/16. lp

## 2016-02-02 ENCOUNTER — Encounter: Payer: Self-pay | Admitting: Family Medicine

## 2016-02-02 ENCOUNTER — Telehealth: Payer: Self-pay | Admitting: Family Medicine

## 2016-02-02 NOTE — Telephone Encounter (Signed)
FYI Pt wanted dr Caryl Never to know he has heart failure  md at [facility] who is taking care of everything

## 2016-02-03 ENCOUNTER — Telehealth: Payer: Self-pay | Admitting: Cardiovascular Disease

## 2016-02-03 NOTE — Telephone Encounter (Signed)
Message sent to [place]. 

## 2016-02-03 NOTE — Telephone Encounter (Signed)
New Message  Auriel from Cover my meds call requesting to speak to RN to f/u on a fax appeal. Please call back to discuss

## 2016-02-09 NOTE — Telephone Encounter (Signed)
Medication has been approved

## 2016-02-11 DIAGNOSIS — I5022 Chronic systolic (congestive) heart failure: Secondary | ICD-10-CM | POA: Diagnosis not present

## 2016-02-17 ENCOUNTER — Encounter: Payer: Self-pay | Admitting: Cardiovascular Disease

## 2016-02-17 ENCOUNTER — Ambulatory Visit (INDEPENDENT_AMBULATORY_CARE_PROVIDER_SITE_OTHER): Payer: BLUE CROSS/BLUE SHIELD | Admitting: Cardiovascular Disease

## 2016-02-17 VITALS — BP 110/70 | Ht 70.5 in | Wt 278.8 lb

## 2016-02-17 DIAGNOSIS — I447 Left bundle-branch block, unspecified: Secondary | ICD-10-CM

## 2016-02-17 DIAGNOSIS — I5042 Chronic combined systolic (congestive) and diastolic (congestive) heart failure: Secondary | ICD-10-CM

## 2016-02-17 DIAGNOSIS — I2510 Atherosclerotic heart disease of native coronary artery without angina pectoris: Secondary | ICD-10-CM

## 2016-02-17 DIAGNOSIS — E782 Mixed hyperlipidemia: Secondary | ICD-10-CM | POA: Diagnosis not present

## 2016-02-17 DIAGNOSIS — R55 Syncope and collapse: Secondary | ICD-10-CM

## 2016-02-17 DIAGNOSIS — G4733 Obstructive sleep apnea (adult) (pediatric): Secondary | ICD-10-CM

## 2016-02-17 DIAGNOSIS — R7303 Prediabetes: Secondary | ICD-10-CM

## 2016-02-17 MED ORDER — CARVEDILOL 6.25 MG PO TABS: 6.2500 mg | ORAL_TABLET | Freq: Two times a day (BID) | ORAL | 11 refills | Status: DC

## 2016-02-17 NOTE — Patient Instructions (Signed)
Dr Royann Shivers has recommended making the following medication changes: 1. INCREASE Carvedilol to 6.25 mg twice daily  Your physician recommends that you schedule a follow-up appointment with one of our pharmacists in 2 weeks for [facility] management.  Dr Royann Shivers recommends that you schedule a follow-up appointment in 5 weeks.  If you need a refill on your cardiac medications before your next appointment, please call your pharmacy.

## 2016-02-17 NOTE — Progress Notes (Signed)
Cardiology consultation Note    Date:  02/17/2016   ID:  Alejandro SaucierJohn D Ramos, DOB 1955-05-02, MRN [id number]  PCP:  MENTZ, Joyce CopaOBERT Ranell, MD  Cardiologist:   Alejandro FairMihai Kateleen Encarnacion, MD  Reason for consultation: Syncope, multiple coronary risk factors Chief Complaint  Patient presents with  . Follow-up    History of Present Illness:  Alejandro Ramos is a 60 y.o. male who presents for recently diagnosed nonischemic cardiomyopathy and congestive heart failure..  Left ventricular systolic function is estimated at 25-30% by echo and LV angiography, 36% by the nuclear test. No reversible ischemia was seen on the nuclear study (small fixed defect which was probably LBBB related artifact). No significant valvular abnormalities are seen. He underwent right and left heart catheterization on 01/12/2016. It showed minimal scattered coronary atherosclerosis and confirmed severely depressed left ventricular systolic function. He was hemodynamically compensated at the time with a mean PA wedge pressure of 14 mmHg and a cardiac index of 2.0 L/min/meter sq. since then he has transitioned to carvedilol and Entresto. He [facility] added very low dose spironolactone (12.5 mg daily). He also scheduled Alejandro RuizJohn for a cardiac MRI on December 7 and a follow-up office visit on January 22.  Alejandro Ramos denies any worsening of his shortness of breath, has not had syncope or palpitations and denies chest discomfort or focal neurological complaints. He continues to feel tired. As before he is very anxious and quickly jumps from one topic to the next. He denies alcohol use at this time.  Past Medical History:  Diagnosis Date  . Back pain, chronic 12/03/2011  . 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 Sleep apnea 12/03/2011   Has a C-PAP machine, but does not use it.  . 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 CARDIAC CATHETERIZATION N/A 01/12/2016   Procedure: Right/Left Heart Cath and Coronary Angiography;  Surgeon: Alejandro FairMihai Emberlee Sortino, MD; RCA 15%, LM 25-30%, LVEDP 12 mm Hg, PWCP mean 14 mm Hg, PA 35/20, mean 26 mm Hg, CO 4.9 L/min, CI 2 L/min (sq)    Current Medications: Outpatient Medications Prior to Visit  Medication Sig Dispense Refill  . acetaminophen (TYLENOL) 500 MG tablet Take 1,000 mg by mouth every 6 (six) hours as needed for moderate pain or headache.    . b complex vitamins tablet Take 1 tablet by mouth daily.    Marland Kitchen. Bioflavonoid Products (ESTER C PO) Take 500 mg by mouth daily.    . Cholecalciferol (VITAMIN D3) 5000 units CAPS Take 5,000 Units by mouth daily.    . Coenzyme Q10 (CO Q 10 PO) Take 500 mg by mouth daily.    . finasteride (PROSCAR) 5 MG tablet Take 5 mg by mouth daily.   4  . gabapentin (NEURONTIN) 600 MG tablet Take 600-1,800 mg by mouth See admin instructions. Take 600 mg by mouth daily and take 1800 mg by mouth at bedtime    . milk thistle 175 MG tablet Take 175 mg by mouth daily.    . Multiple Vitamins-Minerals (MENS 50+ MULTI VITAMIN/MIN PO) Take 1 tablet by mouth daily.    . ondansetron (ZOFRAN) 4 MG tablet TAKE 1TAB BY MOUTH 4 TIMES DAILY  5  . QUEtiapine (SEROQUEL) 25 MG tablet Take 25 mg by mouth 3 (three) times daily as needed (for nausea). SOMETIMES 4 TIMES A DAY    . ramelteon (ROZEREM) 8 MG tablet Take 8 mg by mouth at  bedtime.    . sacubitril-valsartan (ENTRESTO) 24-26 MG Take 1 tablet by mouth 2 (two) times daily. 60 tablet 0  . temazepam (RESTORIL) 15 MG capsule Take 15 mg by mouth at bedtime.     . Testosterone (AXIRON) 30 MG/ACT SOLN PLACE (30 MG) ONTO THE SKIN DAILY    . carvedilol (COREG) 3.125 MG tablet Take 1 tablet (3.125 mg total) by mouth 2 (two) times daily with a meal. 60 tablet 3   No facility-administered medications prior to visit.      Allergies:   Review of patient's allergies indicates no known allergies.   Social History   Social History  . Marital status: Divorced    Spouse  name: N/A  . Number of children: N/A  . Years of education: N/A   Social History Main Topics  . Smoking status: Never Smoker  . Smokeless tobacco: Never Used  . Alcohol use No  . Drug use: No  . Sexual activity: Not Currently   Other Topics Concern  . Not on file   Social History Narrative  . No narrative on file     Family History:  The patient's family history includes Arrhythmia in his maternal grandfather and mother; Congestive Heart Failure in his father; Heart attack in his maternal grandmother; Heart attack (age of onset: 32) in his paternal grandfather; Heart attack (age of onset: 64) in his paternal grandmother; Paranoid behavior in his father.   ROS:   Please see the history of present illness.    ROS All other systems reviewed and are negative.   PHYSICAL EXAM:   VS:  BP 110/70 (BP Location: Left Arm, Patient Position: Sitting, Cuff Size: Normal)   Ht 5' 10.5" (1.791 m)   Wt 278 lb 12.8 oz (126.5 kg)   BMI 39.44 kg/m    GEN: Well nourished, well developed, in no acute distress  HEENT: normal  Neck: no JVD, carotid bruits, or masses Cardiac: Paradoxically split S2, borderline tachycardia, RRR; no murmurs, rubs, or gallops,no edema  Respiratory:  clear to auscultation bilaterally, normal work of breathing GI: soft, nontender, nondistended, + BS MS: no deformity or atrophy  Skin: warm and dry, no rash Neuro:  Alert and Oriented x 3, Strength and sensation are intact Psych: euthymic mood, full affect  Wt Readings from Last 3 Encounters:  02/17/16 278 lb 12.8 oz (126.5 kg)  01/25/16 278 lb (126.1 kg)  01/21/16 280 lb 9.6 oz (127.3 kg)      Studies/Labs Reviewed:   EKG:  EKG is not ordered today.   Recent Labs: 01/06/2016: Hemoglobin 15.9; Platelets 208; TSH 3.87 01/21/2016: BUN 19; Creat 0.98; Potassium 4.7; Sodium 141   Lipid Panel Total cholesterol 223, HDL 65, triglycerides 93, LDL 139 (01/22/2015), hemoglobin A1c 6%  Additional studies/ records that  were reviewed today include:  Notes from Dr. Truett Perna in Care Everywhere    ASSESSMENT:    1. Chronic combined systolic and diastolic heart failure (HCC)   2. Coronary artery calcification seen on CT scan   3. LBBB (left bundle branch block)   4. Mixed hyperlipidemia   5. Morbid obesity (HCC)   6. Pre-diabetes   7. OSA (obstructive sleep apnea)   8. Syncope, unspecified syncope type      PLAN:  In order of problems listed above:  1. CHF: Clinical assessment of volume status is limited by his obesity, but his weight is unchanged from when he had his heart catheterization on the wedge pressure was in normal  range. NYHA functional class II. Discussed the prognosis of heart failure and the fact that it depends not just on how low the EF is, but also on functional status. Will increase the carvedilol dose today. Will bring him back to see our clinical pharmacist in 2 weeks to increase the Entresto dose as BP allows. Plan to repeat a basic metabolic panel the same day. 2. CAD: Coronary lesions are minor and cannot explain his cardiomyopathy. He does not have angina pectoris. 3. LBBB: This is a chronic abnormality.. If he does not respond well to medical therapy for heart failure, option for CRT-D. the MRI scheduled for December 7 will be a good opportunity to reassess LVEF. 4. Hyperlipidemia: Ideally LDL cholesterol less than 161 mg/dL, triglycerides less than 70 if her identify significant coronary obstruction. Will get n updated lipid profile. 5. Morbid obesity: This is clearly the cause for his borderline diabetes mellitus and is contributing significantly to his hyperlipidemia as well as other possible medical problems. Weight loss is critical for his long-term prognosis. 6. Pre-diabetes: Try to avoid atypical antipsychotics that worsen glucose tolerance, lose weight, exercise more frequently, improved diet. Although statins may increase his risk of developing full-blown diabetes, they are  probably beneficial in the overall scheme of things. Elected not to start a statin at this time, since Alejandro Ramos seems to have enough to worry about, and will likely prefer to reevaluate his metabolic profile after he starts exercising and loses weight.. 7. Reported history of obstructive sleep apnea: His hemodynamic findings suggest that his pulmonary hypertension is primarily due to left heart failure. I would still recommend that he reconsider treatment with CPAP. 8. Syncope: It is possible that he had some episodes of vasovagal syncope, since nausea was a prominent complaint. I wonder whether his loss of consciousness may be uncontrollable hypersomnolence in a patient with untreated sleep apnea and chronic insomnia. One cannot completely exclude bradyarrhythmic syncope since he has a left bundle branch block. The most urgent concern is that of possible ventricular tachycardia. He may need to have an ICD. The benefit of life vest is not as well-established in nonischemic cardiomyopathy. Increase carvedilol today.   Medication Adjustments/Labs and Tests Ordered: Current medicines are reviewed at length with the patient today.  Concerns regarding medicines are outlined above.  Medication changes, Labs and Tests ordered today are listed in the Patient Instructions below. Patient Instructions  Dr Royann Shivers has recommended making the following medication changes: 1. INCREASE Carvedilol to 6.25 mg twice daily  Your physician recommends that you schedule a follow-up appointment with one of our pharmacists in 2 weeks for [facility] management.  Dr Royann Shivers recommends that you schedule a follow-up appointment in 5 weeks.  If you need a refill on your cardiac medications before your next appointment, please call your pharmacy.    Signed, Alejandro Fair, MD  02/17/2016 12:58 PM    [facility] [address] [place], [place], [place]  [address] Phone: [phone]; Fax: [phone]

## 2016-02-18 DIAGNOSIS — R972 Elevated prostate specific antigen [PSA]: Secondary | ICD-10-CM | POA: Diagnosis not present

## 2016-02-18 DIAGNOSIS — N411 Chronic prostatitis: Secondary | ICD-10-CM | POA: Diagnosis not present

## 2016-02-18 DIAGNOSIS — K580 Irritable bowel syndrome with diarrhea: Secondary | ICD-10-CM | POA: Diagnosis not present

## 2016-02-18 DIAGNOSIS — E291 Testicular hypofunction: Secondary | ICD-10-CM | POA: Diagnosis not present

## 2016-02-24 ENCOUNTER — Telehealth: Payer: Self-pay | Admitting: Cardiovascular Disease

## 2016-02-24 NOTE — Telephone Encounter (Signed)
New message  Per pt, meds were increased for carvedilol to 6.25mg   6-7 days, has been feeling bad, has been  worse last 2-3 days  Pt is feeling nauseous   Wants to know if meds are what's making him feel bad

## 2016-02-24 NOTE — Telephone Encounter (Signed)
Nausea can be a side effect of carvedilol.  Have him continue same dose, with food, for about another week.  If nausea continues at that point we can cut back to 3.125 mg

## 2016-02-24 NOTE — Telephone Encounter (Signed)
See encounter for 10/25 for med changes - carvedilol increased to 6.25mg  BID. Pt noting concern for increased nausea - could this be related to medication?

## 2016-02-24 NOTE — Telephone Encounter (Signed)
Left msg on VM w advice and to call back if questions.

## 2016-02-29 ENCOUNTER — Other Ambulatory Visit: Payer: Self-pay | Admitting: Physician Assistant

## 2016-03-01 ENCOUNTER — Other Ambulatory Visit: Payer: Self-pay

## 2016-03-03 DIAGNOSIS — F3342 Major depressive disorder, recurrent, in full remission: Secondary | ICD-10-CM | POA: Diagnosis not present

## 2016-03-07 ENCOUNTER — Encounter: Payer: Self-pay | Admitting: Family Medicine

## 2016-03-07 DIAGNOSIS — Z0289 Encounter for other administrative examinations: Secondary | ICD-10-CM

## 2016-03-23 ENCOUNTER — Encounter: Payer: Self-pay | Admitting: Cardiovascular Disease

## 2016-03-23 ENCOUNTER — Ambulatory Visit (INDEPENDENT_AMBULATORY_CARE_PROVIDER_SITE_OTHER): Payer: BLUE CROSS/BLUE SHIELD | Admitting: Cardiovascular Disease

## 2016-03-23 VITALS — BP 100/62 | HR 83 | Ht 70.5 in | Wt 272.8 lb

## 2016-03-23 DIAGNOSIS — I2510 Atherosclerotic heart disease of native coronary artery without angina pectoris: Secondary | ICD-10-CM

## 2016-03-23 DIAGNOSIS — I447 Left bundle-branch block, unspecified: Secondary | ICD-10-CM | POA: Diagnosis not present

## 2016-03-23 DIAGNOSIS — G4733 Obstructive sleep apnea (adult) (pediatric): Secondary | ICD-10-CM

## 2016-03-23 DIAGNOSIS — E782 Mixed hyperlipidemia: Secondary | ICD-10-CM

## 2016-03-23 DIAGNOSIS — I5042 Chronic combined systolic (congestive) and diastolic (congestive) heart failure: Secondary | ICD-10-CM | POA: Diagnosis not present

## 2016-03-23 DIAGNOSIS — R7303 Prediabetes: Secondary | ICD-10-CM

## 2016-03-23 DIAGNOSIS — R55 Syncope and collapse: Secondary | ICD-10-CM

## 2016-03-23 DIAGNOSIS — M533 Sacrococcygeal disorders, not elsewhere classified: Secondary | ICD-10-CM | POA: Diagnosis not present

## 2016-03-23 MED ORDER — BISOPROLOL FUMARATE 5 MG PO TABS: 2.5000 mg | ORAL_TABLET | Freq: Every evening | ORAL | 11 refills | Status: DC

## 2016-03-23 NOTE — Progress Notes (Signed)
Cardiology consultation Note    Date:  03/23/2016   ID:  Alejandro Ramos, DOB Jan 18, 1956, MRN [id number]  PCP:  Emeterio Reeve, MD  Cardiologist:   Thurmon Fair, MD  Reason for consultation: Syncope, multiple coronary risk factors Chief Complaint  Patient presents with  . Follow-up    pt c/o carvedilol side effects     History of Present Illness:  Alejandro Ramos is a 60 y.o. male who presents for recently diagnosedSevere nonischemic cardiomyopathy and congestive heart failure.  He is taking Entresto and very low dose spironolactone (12.5 mg daily). He had also been taking carvedilol which I increased to 6.25 mg twice a day at his last appointment. He developed nausea which he believed to be associated with each dose of this medication and decided to stop it completely. He has been off this medication now for about 3 days. He thinks the nausea is improving.  He is scheduled for a cardiac MRI on December 7 and a follow-up office visit with Dr. Truett Perna on January 22.  Alejandro Ramos is mostly preoccupied by problems with pain in his coccyx following a fall. He denies any shortness of breath, has not had syncope or palpitations and denies chest discomfort or focal neurological complaints. As before he has a little difficulty focusing on each topic of discussion. He denies alcohol use at this time.  Left ventricular systolic function is estimated at 25-30% by echo and LV angiography, 36% by the nuclear test. No reversible ischemia was seen on the nuclear study (small fixed defect which was probably LBBB related artifact). No significant valvular abnormalities are seen. He underwent right and left heart catheterization on 01/12/2016. It showed minimal scattered coronary atherosclerosis and confirmed severely depressed left ventricular systolic function. He was hemodynamically compensated at the time with a mean PA wedge pressure of 14 mmHg and a cardiac index of 2.0 L/min/meter sq.   Past Medical  History:  Diagnosis Date  . Back pain, chronic 12/03/2011  . 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 Sleep apnea 12/03/2011   Has a C-PAP machine, but does not use it.  . 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 CARDIAC CATHETERIZATION N/A 01/12/2016   Procedure: Right/Left Heart Cath and Coronary Angiography;  Surgeon: Thurmon Fair, MD; RCA 15%, LM 25-30%, LVEDP 12 mm Hg, PWCP mean 14 mm Hg, PA 35/20, mean 26 mm Hg, CO 4.9 L/min, CI 2 L/min (sq)    Current Medications: Outpatient Medications Prior to Visit  Medication Sig Dispense Refill  . acetaminophen (TYLENOL) 500 MG tablet Take 1,000 mg by mouth every 6 (six) hours as needed for moderate pain or headache.    . b complex vitamins tablet Take 1 tablet by mouth daily.    Marland Kitchen Bioflavonoid Products (ESTER C PO) Take 500 mg by mouth daily.    . Cholecalciferol (VITAMIN D3) 5000 units CAPS Take 5,000 Units by mouth daily.    . Coenzyme Q10 (CO Q 10 PO) Take 500 mg by mouth daily.    Marland Kitchen ENTRESTO 24-26 MG TAKE 1 TABLET BY MOUTH TWICE A DAY 180 tablet 3  . finasteride (PROSCAR) 5 MG tablet Take 5 mg by mouth daily.   4  . gabapentin (NEURONTIN) 600 MG tablet Take 600-1,800 mg by mouth See admin instructions. Take 600 mg by mouth daily and take 1800 mg by mouth at bedtime    . milk thistle 175 MG tablet Take  175 mg by mouth daily.    . Multiple Vitamins-Minerals (MENS 50+ MULTI VITAMIN/MIN PO) Take 1 tablet by mouth daily.    . ondansetron (ZOFRAN) 4 MG tablet TAKE 1TAB BY MOUTH 4 TIMES DAILY  5  . QUEtiapine (SEROQUEL) 25 MG tablet Take 25 mg by mouth 3 (three) times daily as needed (for nausea). SOMETIMES 4 TIMES A DAY    . ramelteon (ROZEREM) 8 MG tablet Take 8 mg by mouth at bedtime.    Marland Kitchen. spironolactone (ALDACTONE) 25 MG tablet Take 12.5 mg by mouth daily.    . temazepam (RESTORIL) 15 MG capsule Take 15 mg by mouth at bedtime.     . Testosterone (AXIRON) 30  MG/ACT SOLN PLACE (30 MG) ONTO THE SKIN DAILY    . vitamin E 400 UNIT capsule Take 400 Units by mouth daily.    . carvedilol (COREG) 6.25 MG tablet Take 1 tablet (6.25 mg total) by mouth 2 (two) times daily with a meal. (Patient not taking: Reported on 03/23/2016) 60 tablet 11   No facility-administered medications prior to visit.      Allergies:   Patient has no known allergies.   Social History   Social History  . Marital status: Divorced    Spouse name: N/A  . Number of children: N/A  . Years of education: N/A   Social History Main Topics  . Smoking status: Never Smoker  . Smokeless tobacco: Never Used  . Alcohol use No  . Drug use: No  . Sexual activity: Not Currently   Other Topics Concern  . None   Social History Narrative  . None     Family History:  The patient's family history includes Arrhythmia in his maternal grandfather and mother; Congestive Heart Failure in his father; Heart attack in his maternal grandmother; Heart attack (age of onset: 7879) in his paternal grandfather; Heart attack (age of onset: 3293) in his paternal grandmother; Paranoid behavior in his father.   ROS:   Please see the history of present illness.    ROS All other systems reviewed and are negative.   PHYSICAL EXAM:   VS:  BP 100/62 (BP Location: Left Arm, Patient Position: Sitting, Cuff Size: Large)   Pulse 83   Ht 5' 10.5" (1.791 m)   Wt 272 lb 12.8 oz (123.7 kg)   SpO2 97%   BMI 38.59 kg/m    GEN: Well nourished, well developed, in no acute distress  HEENT: normal  Neck: no JVD, carotid bruits, or masses Cardiac: Paradoxically split S2, borderline tachycardia, RRR; no murmurs, rubs, or gallops,no edema  Respiratory:  clear to auscultation bilaterally, normal work of breathing GI: soft, nontender, nondistended, + BS MS: no deformity or atrophy  Skin: warm and dry, no rash Neuro:  Alert and Oriented x 3, Strength and sensation are intact Psych: euthymic mood, full affect  Wt  Readings from Last 3 Encounters:  03/23/16 272 lb 12.8 oz (123.7 kg)  02/17/16 278 lb 12.8 oz (126.5 kg)  01/25/16 278 lb (126.1 kg)      Studies/Labs Reviewed:   EKG:  EKG is not ordered today.   Recent Labs: 01/06/2016: Hemoglobin 15.9; Platelets 208; TSH 3.87 01/21/2016: BUN 19; Creat 0.98; Potassium 4.7; Sodium 141   Lipid Panel Total cholesterol 223, HDL 65, triglycerides 93, LDL 139 (01/22/2015), hemoglobin A1c 6%  Additional studies/ records that were reviewed today include:  Notes from Dr. Truett PernaMentz in Care Everywhere   ASSESSMENT:    No diagnosis found.  PLAN:  In order of problems listed above:  1. CHF: obesityLimits the usefulness of his exam, but his weight is actually 6 pounds less then at the time of heart catheterization, when the wedge pressure was in normal range. NYHA functional class I-II. I do not think we can increase the Entresto dose due to low BP. Reviewed the importance of beta blockers in heart failure and the risk of rebound abnormalities with abrupt cessation of beta blockers. Will try an alternative agent. Will start bisoprolol 2.5 mg daily. Warned him that he may actually have a deterioration in functional status for a week or so after starting this medication and asked to call me before deciding to make changes in medications. He never had the chemistry panel that I planned at the last appointment, but will have labs performed before his cardiac MRI on December 7 at [facility]. 2. CAD: Coronary lesions are minor and cannot explain his cardiomyopathy. He does not have angina pectoris. 3. LBBB: This is a chronic abnormality. If he does not respond well to medical therapy for heart failure, option for CRT-D. The cardiac MRI scheduled for December 7 will be a good opportunity to reassess LVEF. 4. Hyperlipidemia: Ideally LDL cholesterol less than 696 mg/dL, triglycerides less than 70 if her identify significant coronary obstruction. Needs an updated lipid profile.  Ideally we'll achieve target LDL with lifestyle changes rather than more medications. 5. Morbid obesity: He has lost enough weight to move out of morbid obesity territory, now only severely obese. This is clearly the cause for his borderline diabetes mellitus and is contributing significantly to his hyperlipidemia as well as other possible medical problems. Weight loss is critical for his long-term prognosis. He has lost 6 pounds since his last appointment. 6. Pre-diabetes: Try to avoid atypical antipsychotics that worsen glucose tolerance, lose weight, exercise more frequently, improved diet. Although statins may increase his risk of developing full-blown diabetes, they are probably beneficial in the overall scheme of things, if he cannot achieve target LDL levels with diet and exercise. 7. Reported history of obstructive sleep apnea: His hemodynamic findings suggest that his pulmonary hypertension is primarily due to left heart failure. I would still recommend that he reconsider treatment with CPAP in the future. 8. Syncope: Differential diagnosis is broad and includes vasovagal syncope, since nausea was a prominent complaint, uncontrollable hypersomnolence in a patient with untreated sleep apnea and chronic insomnia, bradyarrhythmic syncope (he has LBBB) or VT due to severe cardiomyopathy. He may need to have an ICD and we will make that decision based on assessment with ejection fraction after completing titration of heart failure meds. The findings of cardiac MRI will help in making that decision. Started an alternative beta blocker today   Medication Adjustments/Labs and Tests Ordered: Current medicines are reviewed at length with the patient today.  Concerns regarding medicines are outlined above.  Medication changes, Labs and Tests ordered today are listed in the Patient Instructions below. Patient Instructions  Dr Royann Shivers has recommended making the following medication changes: 1. STOP  Carvedilol 2. START Bisoprolol - take 0.5 tablet (2.5 mg total) by mouth daily in the evening or at bedtime  Your physician recommends that you schedule a follow-up appointment 3 months with Dr C.  If you need a refill on your cardiac medications before your next appointment, please call your pharmacy.    Signed, Thurmon Fair, MD  03/23/2016 7:54 PM    [facility] [address] [place], [place], [place]  [address] Phone: [phone]; Fax: [phone]

## 2016-03-23 NOTE — Patient Instructions (Signed)
Dr Royann Shivers has recommended making the following medication changes: 1. STOP Carvedilol 2. START Bisoprolol - take 0.5 tablet (2.5 mg total) by mouth daily in the evening or at bedtime  Your physician recommends that you schedule a follow-up appointment 3 months with Dr C.  If you need a refill on your cardiac medications before your next appointment, please call your pharmacy.

## 2016-03-24 DIAGNOSIS — F3342 Major depressive disorder, recurrent, in full remission: Secondary | ICD-10-CM | POA: Diagnosis not present

## 2016-03-28 ENCOUNTER — Ambulatory Visit
Admission: RE | Admit: 2016-03-28 | Discharge: 2016-03-28 | Disposition: A | Discharge status: Home / Self Care | Payer: BLUE CROSS/BLUE SHIELD | Source: Ambulatory Visit | Attending: Sports Medicine | Admitting: Sports Medicine

## 2016-03-28 ENCOUNTER — Other Ambulatory Visit: Payer: Self-pay | Admitting: Sports Medicine

## 2016-03-28 DIAGNOSIS — M533 Sacrococcygeal disorders, not elsewhere classified: Secondary | ICD-10-CM

## 2016-03-29 ENCOUNTER — Other Ambulatory Visit: Payer: Self-pay | Admitting: Sports Medicine

## 2016-03-29 DIAGNOSIS — M533 Sacrococcygeal disorders, not elsewhere classified: Secondary | ICD-10-CM

## 2016-03-30 ENCOUNTER — Other Ambulatory Visit: Payer: BLUE CROSS/BLUE SHIELD

## 2016-03-31 ENCOUNTER — Other Ambulatory Visit: Payer: BLUE CROSS/BLUE SHIELD

## 2016-03-31 DIAGNOSIS — I5022 Chronic systolic (congestive) heart failure: Secondary | ICD-10-CM | POA: Diagnosis not present

## 2016-04-01 ENCOUNTER — Ambulatory Visit
Admission: RE | Admit: 2016-04-01 | Discharge: 2016-04-01 | Disposition: A | Discharge status: Home / Self Care | Payer: BLUE CROSS/BLUE SHIELD | Source: Ambulatory Visit | Attending: Sports Medicine | Admitting: Sports Medicine

## 2016-04-01 DIAGNOSIS — M533 Sacrococcygeal disorders, not elsewhere classified: Secondary | ICD-10-CM | POA: Diagnosis not present

## 2016-04-01 MED ORDER — METHYLPREDNISOLONE ACETATE 40 MG/ML INJ SUSP (RADIOLOG
120.0000 mg | Freq: Once | INTRAMUSCULAR | Status: AC
  Administered 2016-04-01: 120 mg via INTRA_ARTICULAR

## 2016-04-01 NOTE — Discharge Instructions (Signed)

## 2016-04-04 DIAGNOSIS — M533 Sacrococcygeal disorders, not elsewhere classified: Secondary | ICD-10-CM | POA: Diagnosis not present

## 2016-04-05 ENCOUNTER — Other Ambulatory Visit: Payer: Self-pay

## 2016-04-14 ENCOUNTER — Telehealth: Payer: Self-pay

## 2016-04-14 DIAGNOSIS — E785 Hyperlipidemia, unspecified: Secondary | ICD-10-CM

## 2016-04-14 NOTE — Telephone Encounter (Signed)
Lipid panel ordered per [facility]. Paperwork mailed to patient.

## 2016-04-28 DIAGNOSIS — E785 Hyperlipidemia, unspecified: Secondary | ICD-10-CM | POA: Diagnosis not present

## 2016-04-28 LAB — LIPID PANEL
CHOL/HDL RATIO: 3.1 ratio (ref <5.0)
Cholesterol: 181 mg/dL (ref <200)
HDL: 58 mg/dL (ref >40)
LDL CALC: 105 mg/dL — AB (ref <100)
TRIGLYCERIDES: 92 mg/dL (ref <150)
VLDL: 18 mg/dL (ref <30)

## 2016-05-16 DIAGNOSIS — G4733 Obstructive sleep apnea (adult) (pediatric): Secondary | ICD-10-CM | POA: Diagnosis not present

## 2016-05-16 DIAGNOSIS — I5022 Chronic systolic (congestive) heart failure: Secondary | ICD-10-CM | POA: Diagnosis not present

## 2016-06-13 DIAGNOSIS — L821 Other seborrheic keratosis: Secondary | ICD-10-CM | POA: Diagnosis not present

## 2016-06-13 DIAGNOSIS — D1801 Hemangioma of skin and subcutaneous tissue: Secondary | ICD-10-CM | POA: Diagnosis not present

## 2016-06-13 DIAGNOSIS — L814 Other melanin hyperpigmentation: Secondary | ICD-10-CM | POA: Diagnosis not present

## 2016-06-13 DIAGNOSIS — D225 Melanocytic nevi of trunk: Secondary | ICD-10-CM | POA: Diagnosis not present

## 2016-06-21 ENCOUNTER — Ambulatory Visit: Payer: Self-pay | Admitting: Cardiovascular Disease

## 2016-07-15 DIAGNOSIS — I428 Other cardiomyopathies: Secondary | ICD-10-CM | POA: Diagnosis not present

## 2016-07-15 DIAGNOSIS — R51 Headache: Secondary | ICD-10-CM | POA: Diagnosis not present

## 2016-07-15 DIAGNOSIS — I2510 Atherosclerotic heart disease of native coronary artery without angina pectoris: Secondary | ICD-10-CM | POA: Diagnosis not present

## 2016-07-15 DIAGNOSIS — I5042 Chronic combined systolic (congestive) and diastolic (congestive) heart failure: Secondary | ICD-10-CM | POA: Diagnosis not present

## 2016-07-18 DIAGNOSIS — F3342 Major depressive disorder, recurrent, in full remission: Secondary | ICD-10-CM | POA: Diagnosis not present

## 2016-08-18 DIAGNOSIS — N401 Enlarged prostate with lower urinary tract symptoms: Secondary | ICD-10-CM | POA: Diagnosis not present

## 2016-08-18 DIAGNOSIS — Z87442 Personal history of urinary calculi: Secondary | ICD-10-CM | POA: Diagnosis not present

## 2016-08-18 DIAGNOSIS — N138 Other obstructive and reflux uropathy: Secondary | ICD-10-CM | POA: Diagnosis not present

## 2016-08-18 DIAGNOSIS — E291 Testicular hypofunction: Secondary | ICD-10-CM | POA: Diagnosis not present

## 2016-08-19 ENCOUNTER — Emergency Department (HOSPITAL_BASED_OUTPATIENT_CLINIC_OR_DEPARTMENT_OTHER)
Admission: EM | Admit: 2016-08-19 | Discharge: 2016-08-19 | Disposition: A | Discharge status: Home / Self Care | Payer: BLUE CROSS/BLUE SHIELD | Attending: Emergency Medicine | Admitting: Emergency Medicine

## 2016-08-19 ENCOUNTER — Encounter (HOSPITAL_BASED_OUTPATIENT_CLINIC_OR_DEPARTMENT_OTHER): Payer: Self-pay | Admitting: Emergency Medicine

## 2016-08-19 ENCOUNTER — Emergency Department (HOSPITAL_BASED_OUTPATIENT_CLINIC_OR_DEPARTMENT_OTHER): Payer: BLUE CROSS/BLUE SHIELD

## 2016-08-19 DIAGNOSIS — I509 Heart failure, unspecified: Secondary | ICD-10-CM | POA: Diagnosis not present

## 2016-08-19 DIAGNOSIS — R109 Unspecified abdominal pain: Secondary | ICD-10-CM | POA: Insufficient documentation

## 2016-08-19 DIAGNOSIS — R110 Nausea: Secondary | ICD-10-CM | POA: Insufficient documentation

## 2016-08-19 DIAGNOSIS — Z79899 Other long term (current) drug therapy: Secondary | ICD-10-CM | POA: Insufficient documentation

## 2016-08-19 DIAGNOSIS — I2510 Atherosclerotic heart disease of native coronary artery without angina pectoris: Secondary | ICD-10-CM | POA: Diagnosis not present

## 2016-08-19 HISTORY — DX: Atherosclerotic heart disease of native coronary artery without angina pectoris: I25.10

## 2016-08-19 HISTORY — DX: Irritable bowel syndrome, unspecified: K58.9

## 2016-08-19 HISTORY — DX: Chronic prostatitis: N41.1

## 2016-08-19 HISTORY — DX: Calculus of kidney: N20.0

## 2016-08-19 HISTORY — DX: Obstructive sleep apnea (adult) (pediatric): G47.33

## 2016-08-19 HISTORY — DX: Left bundle-branch block, unspecified: I44.7

## 2016-08-19 HISTORY — DX: Heart failure, unspecified: I50.9

## 2016-08-19 LAB — URINALYSIS, ROUTINE W REFLEX MICROSCOPIC
Bilirubin Urine: NEGATIVE
GLUCOSE, UA: NEGATIVE mg/dL
HGB URINE DIPSTICK: NEGATIVE
Ketones, ur: NEGATIVE mg/dL
LEUKOCYTES UA: NEGATIVE
Nitrite: NEGATIVE
PH: 6 (ref 5.0–8.0)
Protein, ur: NEGATIVE mg/dL
SPECIFIC GRAVITY, URINE: 1.016 (ref 1.005–1.030)

## 2016-08-19 MED ORDER — HYDROMORPHONE HCL 1 MG/ML IJ SOLN
1.0000 mg | Freq: Once | INTRAMUSCULAR | Status: AC
  Administered 2016-08-19: 1 mg via INTRAVENOUS
  Filled 2016-08-19: qty 1

## 2016-08-19 MED ORDER — OXYCODONE-ACETAMINOPHEN 5-325 MG PO TABS: 1.0000 | ORAL_TABLET | ORAL | 0 refills | Status: DC | PRN

## 2016-08-19 MED ORDER — PROMETHAZINE HCL 25 MG PO TABS: 25.0000 mg | ORAL_TABLET | Freq: Four times a day (QID) | ORAL | 0 refills | Status: DC | PRN

## 2016-08-19 MED ORDER — PROMETHAZINE HCL 25 MG/ML IJ SOLN
12.5000 mg | Freq: Once | INTRAMUSCULAR | Status: AC
  Administered 2016-08-19: 12.5 mg via INTRAVENOUS
  Filled 2016-08-19: qty 1

## 2016-08-19 NOTE — ED Provider Notes (Signed)
[facility] Provider Note: Lowella Dell, MD, FACEP  CSN: [id number] MRN: [id number] ARRIVAL: 08/19/16 at 0308 ROOM: MH10/[facility]10   CHIEF COMPLAINT  Flank Pain   HISTORY OF PRESENT ILLNESS  Alejandro Ramos is a 61 y.o. male with a history of ureterolithiasis. He is here with right flank pain that began about 7 PM yesterday evening. He rates the pain as a 7 out of 10 and characterizes it as like prior kidney stones. He has associated nausea which she rates as a 9.5 out of 10. He took 12 milligrams of Zofran yesterday evening without relief of his nausea. He repeated this about 6 hours later again without relief. He has also taken hydrocodone, Zanaflex, Pepto-Bismol and an Epson salt baths without relief of his pain. Pain is not affected by movement. He has not noticed hematuria or dysuria.   Past Medical History:  Diagnosis Date  . Back pain, chronic 12/03/2011  . CAD (coronary artery disease)   . CHF (congestive heart failure) (HCC)   . Chronic prostatitis   . 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 IBS (irritable bowel syndrome)   . Kidney stone   . LBBB (left bundle branch block)   . OSA (obstructive sleep apnea)   . Sleep apnea 12/03/2011   Has a C-PAP machine, but does not use it.  . 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 CARDIAC CATHETERIZATION N/A 01/12/2016   Procedure: Right/Left Heart Cath and Coronary Angiography;  Surgeon: Thurmon Fair, MD; RCA 15%, LM 25-30%, LVEDP 12 mm Hg, PWCP mean 14 mm Hg, PA 35/20, mean 26 mm Hg, CO 4.9 L/min, CI 2 L/min (sq)    Family History  Problem Relation Age of Onset  . Paranoid behavior Father   . Congestive Heart Failure Father   . Arrhythmia Mother     has a PPM  . Heart attack Maternal Grandmother     45s  . Arrhythmia Maternal Grandfather     14s  . Heart attack Paternal Grandmother 26  . Heart attack Paternal Grandfather 58     Social History  Substance Use Topics  . Smoking status: Never Smoker  . Smokeless tobacco: Never Used  . Alcohol use No    Prior to Admission medications   Medication Sig Start Date End Date Taking? Authorizing Provider  acetaminophen (TYLENOL) 500 MG tablet Take 1,000 mg by mouth every 6 (six) hours as needed for moderate pain or headache.    Historical Provider, MD  b complex vitamins tablet Take 1 tablet by mouth daily.    Historical Provider, MD  Bioflavonoid Products (ESTER C PO) Take 500 mg by mouth daily.    Historical Provider, MD  bisoprolol (ZEBETA) 5 MG tablet Take 0.5 tablets (2.5 mg total) by mouth every evening. 03/23/16   Mihai Croitoru, MD  Cholecalciferol (VITAMIN D3) 5000 units CAPS Take 5,000 Units by mouth daily.    Historical Provider, MD  Coenzyme Q10 (CO Q 10 PO) Take 500 mg by mouth daily.    Historical Provider, MD  ENTRESTO 24-26 MG TAKE 1 TABLET BY MOUTH TWICE A DAY 02/29/16   Mihai Croitoru, MD  finasteride (PROSCAR) 5 MG tablet Take 5 mg by mouth daily.  01/18/16   Historical Provider, MD  gabapentin (NEURONTIN) 600 MG tablet Take 600-1,800 mg by mouth See admin instructions. Take 600 mg by mouth daily and take 1800 mg by mouth at bedtime  Historical Provider, MD  milk thistle 175 MG tablet Take 175 mg by mouth daily.    Historical Provider, MD  Multiple Vitamins-Minerals (MENS 50+ MULTI VITAMIN/MIN PO) Take 1 tablet by mouth daily.    Historical Provider, MD  ondansetron (ZOFRAN) 4 MG tablet TAKE 1TAB BY MOUTH 4 TIMES DAILY 01/15/16   Historical Provider, MD  oxyCODONE-acetaminophen (PERCOCET) 5-325 MG tablet Take 1 tablet by mouth every 4 (four) hours as needed (for pain). 08/19/16   Romelo Brette Cast, MD  promethazine (PHENERGAN) 25 MG tablet Take 1 tablet (25 mg total) by mouth every 6 (six) hours as needed for nausea or vomiting. 08/19/16   Paula Libra, MD  QUEtiapine (SEROQUEL) 25 MG tablet Take 25 mg by mouth 3 (three) times daily as needed (for nausea).  SOMETIMES 4 TIMES A DAY    Historical Provider, MD  ramelteon (ROZEREM) 8 MG tablet Take 8 mg by mouth at bedtime.    Historical Provider, MD  spironolactone (ALDACTONE) 25 MG tablet Take 12.5 mg by mouth daily. 02/01/16 01/31/17  Historical Provider, MD  temazepam (RESTORIL) 15 MG capsule Take 15 mg by mouth at bedtime.     Historical Provider, MD  Testosterone (AXIRON) 30 MG/ACT SOLN PLACE (30 MG) ONTO THE SKIN DAILY 01/14/16   Historical Provider, MD  vitamin E 400 UNIT capsule Take 400 Units by mouth daily.    Historical Provider, MD    Allergies Patient has no known allergies.   REVIEW OF SYSTEMS  Negative except as noted here or in the History of Present Illness.   PHYSICAL EXAMINATION  Initial Vital Signs Blood pressure 107/76, pulse 76, temperature 98 F (36.7 C), temperature source Oral, resp. rate 18, SpO2 95 %.  Examination General: Well-developed, obese male in no acute distress; appearance consistent with age of record HENT: normocephalic; atraumatic Eyes: pupils equal, round and reactive to light; extraocular muscles intact Neck: supple Heart: regular rate and rhythm Lungs: clear to auscultation bilaterally Abdomen: soft; obese; nontender; bowel sounds present GU: No CVA tenderness Extremities: No deformity; full range of motion; pulses normal Neurologic: Awake, alert and oriented; motor function intact in all extremities and symmetric; no facial droop Skin: Warm and dry Psychiatric: Normal mood and affect   RESULTS  Summary of this visit's results, reviewed by myself:   EKG Interpretation  Date/Time:    Ventricular Rate:    PR Interval:    QRS Duration:   QT Interval:    QTC Calculation:   R Axis:     Text Interpretation:        Laboratory Studies: Results for orders placed or performed during the hospital encounter of 08/19/16 (from the past 24 hour(s))  Urinalysis, Routine w reflex microscopic     Status: None   Collection Time: 08/19/16  3:17 AM   Result Value Ref Range   Color, Urine YELLOW YELLOW   APPearance CLEAR CLEAR   Specific Gravity, Urine 1.016 1.005 - 1.030   pH 6.0 5.0 - 8.0   Glucose, UA NEGATIVE NEGATIVE mg/dL   Hgb urine dipstick NEGATIVE NEGATIVE   Bilirubin Urine NEGATIVE NEGATIVE   Ketones, ur NEGATIVE NEGATIVE mg/dL   Protein, ur NEGATIVE NEGATIVE mg/dL   Nitrite NEGATIVE NEGATIVE   Leukocytes, UA NEGATIVE NEGATIVE   Imaging Studies: Ct Renal Stone Study  Result Date: 08/19/2016 CLINICAL DATA:  61 year old male with right flank pain. EXAM: CT ABDOMEN AND PELVIS WITHOUT CONTRAST TECHNIQUE: Multidetector CT imaging of the abdomen and pelvis was performed following the standard protocol  without IV contrast. COMPARISON:  Abdominal CT dated 06/30/2014 FINDINGS: Evaluation of this exam is limited in the absence of intravenous contrast. Lower chest: Minimal bibasilar dependent atelectatic changes. The visualized lung bases are otherwise clear. Coronary vascular calcification involving the LAD and RCA. No intra-abdominal free air or free fluid. Hepatobiliary: No focal liver abnormality is seen. No gallstones, gallbladder wall thickening, or biliary dilatation. Pancreas: Unremarkable. No pancreatic ductal dilatation or surrounding inflammatory changes. Spleen: Normal in size without focal abnormality. Adrenals/Urinary Tract: The adrenal glands are unremarkable. There is a 3 mm nonobstructing right renal interpolar stone. There is mild pelviectasis of the right kidney. A 3.5 x 3.5 cm hypodense lesion in the upper pole of the right kidney appears similar to prior CT. This is incompletely characterized on this unenhanced study but demonstrates fluid attenuation and most likely represents a cyst. A 16 x 20 mm exophytic hypodense lesion from the lateral interpolar right kidney is not well characterized. A large cyst was seen arising from this region on the prior CT. This may represent residual cyst, however a solid lesion is not  excluded. MRI is recommended for further characterization. Multiple small nonobstructing left renal calculi measure up to 3 mm in the interpolar aspect of the left kidney. There is no hydronephrosis on the left. The visualized ureters and urinary bladder appear unremarkable. Stomach/Bowel: Moderate stool noted throughout the colon. There is no evidence of bowel obstruction or active inflammation. Normal appendix. Vascular/Lymphatic: Mild aortoiliac atherosclerotic disease. The abdominal aorta and IVC are otherwise grossly unremarkable on this noncontrast study. 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 No portal venous gas identified. There is no adenopathy. Reproductive: Mild enlargement of the prostate gland measuring 5 cm in transverse diameter. Other: Small fat containing umbilical hernia.  No fluid collection. Musculoskeletal: Mild degenerative changes of the spine. No acute fracture. IMPRESSION: 1. Small nonobstructing bilateral renal calculi. Mild right renal pelviectasis. No hydronephrosis or obstructing stone. 2. Exophytic hypodense lesion from the lateral interpolar cortex of the right kidney may represent residual cyst or a solid lesion. MRI is recommended for further characterization. 3. No evidence of bowel obstruction or active inflammation. Normal appendix. Electronically Signed   By: Elgie Collard M.D.   On: 08/19/2016 04:58   Note: patient has a renal ultrasound scheduled by his urologist for Monday (three days from today).  ED COURSE  Nursing notes and initial vitals signs, including pulse oximetry, reviewed.  Vitals:   08/19/16 0313  BP: 107/76  Pulse: 76  Resp: 18  Temp: 98 F (36.7 C)  TempSrc: Oral  SpO2: 95%    PROCEDURES    ED DIAGNOSES     ICD-9-CM ICD-10-CM   1. Right flank pain 789.09 R10.9        Paula Libra, MD 08/19/16 [phone]

## 2016-08-19 NOTE — ED Notes (Signed)
Pt stated would call a cab

## 2016-08-19 NOTE — ED Notes (Signed)
c/o rt flank pain onset last pm,  Nausea,  No relief w multiple doses of tylenol and zofran

## 2016-08-19 NOTE — ED Triage Notes (Signed)
Pt c/o right flank pain with nausea that started at 7 pm last night. Pt states he has taken zofran at home without relief.

## 2016-08-29 ENCOUNTER — Ambulatory Visit: Payer: BLUE CROSS/BLUE SHIELD | Admitting: Cardiovascular Disease

## 2016-08-31 DIAGNOSIS — R109 Unspecified abdominal pain: Secondary | ICD-10-CM | POA: Diagnosis not present

## 2016-09-06 DIAGNOSIS — M533 Sacrococcygeal disorders, not elsewhere classified: Secondary | ICD-10-CM | POA: Diagnosis not present

## 2016-09-14 DIAGNOSIS — R110 Nausea: Secondary | ICD-10-CM | POA: Diagnosis not present

## 2016-09-14 DIAGNOSIS — Z1211 Encounter for screening for malignant neoplasm of colon: Secondary | ICD-10-CM | POA: Diagnosis not present

## 2016-09-14 DIAGNOSIS — K580 Irritable bowel syndrome with diarrhea: Secondary | ICD-10-CM | POA: Diagnosis not present

## 2016-09-20 DIAGNOSIS — K2970 Gastritis, unspecified, without bleeding: Secondary | ICD-10-CM | POA: Diagnosis not present

## 2016-09-20 DIAGNOSIS — K319 Disease of stomach and duodenum, unspecified: Secondary | ICD-10-CM | POA: Diagnosis not present

## 2016-09-20 DIAGNOSIS — R110 Nausea: Secondary | ICD-10-CM | POA: Diagnosis not present

## 2016-09-20 DIAGNOSIS — K210 Gastro-esophageal reflux disease with esophagitis: Secondary | ICD-10-CM | POA: Diagnosis not present

## 2016-09-20 DIAGNOSIS — K2980 Duodenitis without bleeding: Secondary | ICD-10-CM | POA: Diagnosis not present

## 2016-09-20 DIAGNOSIS — K2990 Gastroduodenitis, unspecified, without bleeding: Secondary | ICD-10-CM | POA: Diagnosis not present

## 2016-09-20 DIAGNOSIS — Z1211 Encounter for screening for malignant neoplasm of colon: Secondary | ICD-10-CM | POA: Diagnosis not present

## 2016-09-20 DIAGNOSIS — K228 Other specified diseases of esophagus: Secondary | ICD-10-CM | POA: Diagnosis not present

## 2016-09-20 DIAGNOSIS — K5730 Diverticulosis of large intestine without perforation or abscess without bleeding: Secondary | ICD-10-CM | POA: Diagnosis not present

## 2016-09-20 DIAGNOSIS — K2950 Unspecified chronic gastritis without bleeding: Secondary | ICD-10-CM | POA: Diagnosis not present

## 2016-09-25 DIAGNOSIS — R252 Cramp and spasm: Secondary | ICD-10-CM | POA: Diagnosis not present

## 2016-10-03 DIAGNOSIS — F3342 Major depressive disorder, recurrent, in full remission: Secondary | ICD-10-CM | POA: Diagnosis not present

## 2016-10-31 ENCOUNTER — Other Ambulatory Visit: Payer: Self-pay | Admitting: Cardiovascular Disease

## 2016-11-14 DIAGNOSIS — I5022 Chronic systolic (congestive) heart failure: Secondary | ICD-10-CM | POA: Diagnosis not present

## 2016-11-17 DIAGNOSIS — E291 Testicular hypofunction: Secondary | ICD-10-CM | POA: Diagnosis not present

## 2016-11-17 DIAGNOSIS — R972 Elevated prostate specific antigen [PSA]: Secondary | ICD-10-CM | POA: Diagnosis not present

## 2016-11-17 DIAGNOSIS — N411 Chronic prostatitis: Secondary | ICD-10-CM | POA: Diagnosis not present

## 2016-11-17 DIAGNOSIS — N138 Other obstructive and reflux uropathy: Secondary | ICD-10-CM | POA: Diagnosis not present

## 2016-11-17 DIAGNOSIS — N401 Enlarged prostate with lower urinary tract symptoms: Secondary | ICD-10-CM | POA: Diagnosis not present

## 2016-11-18 ENCOUNTER — Ambulatory Visit (INDEPENDENT_AMBULATORY_CARE_PROVIDER_SITE_OTHER): Payer: BLUE CROSS/BLUE SHIELD | Admitting: Cardiovascular Disease

## 2016-11-18 ENCOUNTER — Encounter: Payer: Self-pay | Admitting: Cardiovascular Disease

## 2016-11-18 VITALS — BP 118/82 | HR 77 | Ht 71.0 in | Wt 262.2 lb

## 2016-11-18 DIAGNOSIS — G4733 Obstructive sleep apnea (adult) (pediatric): Secondary | ICD-10-CM

## 2016-11-18 DIAGNOSIS — I429 Cardiomyopathy, unspecified: Secondary | ICD-10-CM

## 2016-11-18 DIAGNOSIS — Z6835 Body mass index (BMI) 35.0-35.9, adult: Secondary | ICD-10-CM | POA: Diagnosis not present

## 2016-11-18 DIAGNOSIS — I2510 Atherosclerotic heart disease of native coronary artery without angina pectoris: Secondary | ICD-10-CM | POA: Diagnosis not present

## 2016-11-18 DIAGNOSIS — I447 Left bundle-branch block, unspecified: Secondary | ICD-10-CM | POA: Diagnosis not present

## 2016-11-18 DIAGNOSIS — E782 Mixed hyperlipidemia: Secondary | ICD-10-CM

## 2016-11-18 DIAGNOSIS — I5042 Chronic combined systolic (congestive) and diastolic (congestive) heart failure: Secondary | ICD-10-CM | POA: Diagnosis not present

## 2016-11-18 DIAGNOSIS — R7303 Prediabetes: Secondary | ICD-10-CM

## 2016-11-18 DIAGNOSIS — R55 Syncope and collapse: Secondary | ICD-10-CM

## 2016-11-18 NOTE — Patient Instructions (Signed)
Medication Instructions:  Continue current medications  Labwork: None Ordered  Testing/Procedures: None Ordered  Follow-Up: Your physician recommends that you schedule a follow-up appointment in: First week in December   Any Other Special Instructions Will Be Listed Below (If Applicable).   If you need a refill on your cardiac medications before your next appointment, please call your pharmacy.

## 2016-11-18 NOTE — Progress Notes (Signed)
Cardiology consultation Note    Date:  11/18/2016   ID:  Alejandro Ramos, DOB [birth year], MRN [id number]  PCP:  Mila Palmer, MD  Cardiologist:   Thurmon Fair, MD  Reason for consultation: Syncope, multiple coronary risk factors Chief Complaint  Patient presents with  . Follow-up    pt reports no complaints    History of Present Illness:  Alejandro Ramos is a 61 y.o. male who presents for Idiopathic dilated cardiomyopathy and congestive heart failure.  He just saw Dr. Truett Perna at [facility]. He has switched from carvedilol to bisoprolol and the dose was just increased to 5 mg last week. He has lost 16 pounds in the last 10 months and plans to lose another 50 pounds in the next year. He denies exertional dyspnea, orthopnea, bendopnea, leg edema, palpitations, dizziness syncope or chest pain.  Last assessment of LVEF was by cardiac MRI in December which showed an improvement from 25-30% by echo and LV angiogram, up to 46% by MRI. The cardiac index was calculated at 2.2 L/minute/meter squared. Gadolinium enhancement showed no evidence of infiltrative abnormalities or any scar.  Labs performed just last week showed that his hemoglobin is unusually high at 16.9. Alejandro Ramos does have a history of obstructive sleep apnea but has been intolerant to treatment.  Currently, the only psychotropic medication his taking is Seroquel at night. He occasionally takes Rozerem for sleep.  In 2017, Left ventricular systolic function was estimated at 25-30% by echo and LV angiography, 36% by the nuclear test. No reversible ischemia was seen on the nuclear study (small fixed defect which was probably LBBB related artifact). No significant valvular abnormalities are seen. He underwent right and left heart catheterization on 01/12/2016. It showed minimal scattered coronary atherosclerosis and confirmed severely depressed left ventricular systolic function. He was hemodynamically compensated at the time with a mean PA wedge  pressure of 14 mmHg and a cardiac index of 2.0 L/min/meter sq.  Last assessment of LVEF was by cardiac MRI in December 2017 which showed an improvement from 25-30% by echo and LV angiogram, up to 46% by MRI. The cardiac index was calculated at 2.2 L/minute/meter squared. Gadolinium enhancement showed no evidence of infiltrative abnormalities or any scar.  Past Medical History:  Diagnosis Date  . Back pain, chronic 12/03/2011  . CAD (coronary artery disease)   . CHF (congestive heart failure) (HCC)   . Chronic prostatitis   . 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 IBS (irritable bowel syndrome)   . Kidney stone   . LBBB (left bundle branch block)   . OSA (obstructive sleep apnea)   . Sleep apnea 12/03/2011   Has a C-PAP machine, but does not use it.  . 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 CARDIAC CATHETERIZATION N/A 01/12/2016   Procedure: Right/Left Heart Cath and Coronary Angiography;  Surgeon: Thurmon Fair, MD; RCA 15%, LM 25-30%, LVEDP 12 mm Hg, PWCP mean 14 mm Hg, PA 35/20, mean 26 mm Hg, CO 4.9 L/min, CI 2 L/min (sq)    Current Medications: Outpatient Medications Prior to Visit  Medication Sig Dispense Refill  . acetaminophen (TYLENOL) 500 MG tablet Take 1,000 mg by mouth every 6 (six) hours as needed for moderate pain or headache.    Marland Kitchen ENTRESTO 24-26 MG TAKE 1 TABLET BY MOUTH TWICE A DAY 180 tablet 3  . finasteride (PROSCAR) 5 MG tablet Take 5 mg by mouth daily.  4  . gabapentin (NEURONTIN) 600 MG tablet Take 1,800 mg by mouth at bedtime.     . Multiple Vitamins-Minerals (MENS 50+ MULTI VITAMIN/MIN PO) Take 1 tablet by mouth daily.    . ondansetron (ZOFRAN) 4 MG tablet Take 1 tablet by mouth daily as needed.  5  . oxyCODONE-acetaminophen (PERCOCET) 5-325 MG tablet Take 1 tablet by mouth every 4 (four) hours as needed (for pain). 20 tablet 0  . QUEtiapine (SEROQUEL) 25 MG tablet Take 25 mg by  mouth 3 (three) times daily as needed (for nausea). SOMETIMES 4 TIMES A DAY    . ramelteon (ROZEREM) 8 MG tablet Take 8 mg by mouth at bedtime.    Marland Kitchen spironolactone (ALDACTONE) 25 MG tablet TAKE 0.5 TABLETS (12.5 MG TOTAL) BY MOUTH ONCE DAILY. 15 tablet 10  . Testosterone (AXIRON) 30 MG/ACT SOLN PLACE (30 MG) ONTO THE SKIN DAILY    . b complex vitamins tablet Take 1 tablet by mouth daily.    Marland Kitchen Bioflavonoid Products (ESTER C PO) Take 500 mg by mouth daily.    . bisoprolol (ZEBETA) 5 MG tablet Take 0.5 tablets (2.5 mg total) by mouth every evening. (Patient not taking: Reported on 11/18/2016) 15 tablet 11  . Cholecalciferol (VITAMIN D3) 5000 units CAPS Take 5,000 Units by mouth daily.    . Coenzyme Q10 (CO Q 10 PO) Take 500 mg by mouth daily.    . milk thistle 175 MG tablet Take 175 mg by mouth daily.    . promethazine (PHENERGAN) 25 MG tablet Take 1 tablet (25 mg total) by mouth every 6 (six) hours as needed for nausea or vomiting. (Patient not taking: Reported on 11/18/2016) 12 tablet 0  . temazepam (RESTORIL) 15 MG capsule Take 15 mg by mouth at bedtime.     . vitamin E 400 UNIT capsule Take 400 Units by mouth daily.     No facility-administered medications prior to visit.      Allergies:   Patient has no known allergies.   Social History   Social History  . Marital status: Divorced    Spouse name: N/A  . Number of children: N/A  . Years of education: N/A   Social History Main Topics  . Smoking status: Never Smoker  . Smokeless tobacco: Never Used  . Alcohol use No  . Drug use: No  . Sexual activity: Not Currently   Other Topics Concern  . None   Social History Narrative  . None     Family History:  The patient's family history includes Arrhythmia in his maternal grandfather and mother; Congestive Heart Failure in his father; Heart attack in his maternal grandmother; Heart attack (age of onset: 51) in his paternal grandfather; Heart attack (age of onset: 75) in his paternal  grandmother; Paranoid behavior in his father.   ROS:   Please see the history of present illness.    ROS All other systems reviewed and are negative.   PHYSICAL EXAM:   VS:  BP 118/82   Pulse 77   Ht 5\' 11"  (1.803 m)   Wt 262 lb 3.2 oz (118.9 kg)   BMI 36.57 kg/m    GEN: Well nourished, well developed, in no acute distress   General: Alert, oriented x3, no distress Head: no evidence of trauma, PERRL, EOMI, no exophtalmos or lid lag, no myxedema, no xanthelasma; normal ears, nose and oropharynx Neck: normal jugular venous pulsations and no hepatojugular reflux; brisk carotid pulses without delay and no carotid bruits Chest:  clear to auscultation, no signs of consolidation by percussion or palpation, normal fremitus, symmetrical and full respiratory excursions Cardiovascular: normal position and quality of the apical impulse, regular rhythm, normal first and Paradoxically split second heart sounds, no murmurs, rubs or gallops Abdomen: no tenderness or distention, no masses by palpation, no abnormal pulsatility or arterial bruits, normal bowel sounds, no hepatosplenomegaly Extremities: no clubbing, cyanosis or edema; 2+ radial, ulnar and brachial pulses bilaterally; 2+ right femoral, posterior tibial and dorsalis pedis pulses; 2+ left femoral, posterior tibial and dorsalis pedis pulses; no subclavian or femoral bruits Neurological: grossly nonfocal Psych: euthymic mood, full affect. His ability to concentrate and focus seems improved today.  Wt Readings from Last 3 Encounters:  11/18/16 262 lb 3.2 oz (118.9 kg)  03/23/16 272 lb 12.8 oz (123.7 kg)  02/17/16 278 lb 12.8 oz (126.5 kg)      Studies/Labs Reviewed:   EKG:  EKG is not ordered today.   Recent Labs: 01/06/2016: Hemoglobin 15.9; Platelets 208; TSH 3.87 01/21/2016: BUN 19; Creat 0.98; Potassium 4.7; Sodium 141   Lipid Panel Total cholesterol 223, HDL 65, triglycerides 93, LDL 139 (01/22/2015), hemoglobin A1c  6%  Additional studies/ records that were reviewed today include:  Notes from Dr. Truett Perna in Care Everywhere July 23 and Dr. Logan Bores by 26   ASSESSMENT:    1. Chronic combined systolic and diastolic heart failure (HCC)   2. Cardiomyopathy, unspecified type (HCC)   3. Coronary artery disease involving native coronary artery of native heart without angina pectoris   4. LBBB (left bundle branch block)   5. Mixed hyperlipidemia   6. Severe obesity (BMI 35.0-35.9 with comorbidity) (HCC)   7. Pre-diabetes   8. OSA (obstructive sleep apnea)   9. Vasovagal syncope     PLAN:  In order of problems listed above:  1. CHF: As far as I can tell from his clinical exam he is euvolemic. NYHA functional class I. His dose of beta blocker was just increased 4 days ago and I would avoid making any additional changes in medications at this time. Target dose of bisoprolol is 10 mg daily. If his blood pressure allows we may then try to increase the dose of Entresto. 2. CMP: No diagnostic findings on cardiac MRI, no evidence of infiltrative disorder 3. CAD: Coronary lesions are minor and cannot explain his cardiomyopathy. He does not have angina pectoris. Target LDL<100, preferably less than 70 4. LBBB: This is a chronic abnormality. If ventricular systolic function deteriorates in the future despite medical therapy for heart failure, option for CRT-D. . 5. Hyperlipidemia: Currently not on lipid-lowering meds 6. Severe obesity: Congratulated on successful weight loss. Currently not needing any medicines for diabetes mellitus. Weight loss is critical for his long-term prognosis. His polycythemia may be related to untreated obstructive sleep apnea or obesity hypoventilation. Weight loss is an important part of reversing that. 7. Pre-diabetes: Try to avoid atypical antipsychotics that worsen glucose tolerance, lose weight, exercise more frequently, improved diet.  8. Reported history of obstructive sleep apnea: His  hemodynamic findings suggest that his pulmonary hypertension is primarily due to left heart failure. He does not think he'll ever be able to tolerate CPAP. Encourage additional weight loss. 9. Syncope: No recurrent events. Retrospectively, suspect it was a vasovagal event  We'll try to alternate his visits at [facility] with his visits here, with a longer period in between.   Medication Adjustments/Labs and Tests Ordered: Current medicines are reviewed at length with the patient today.  Concerns regarding medicines are outlined above.  Medication changes, Labs and Tests ordered today are listed in the Patient Instructions below. Patient Instructions  Medication Instructions:  Continue current medications  Labwork: None Ordered  Testing/Procedures: None Ordered  Follow-Up: Your physician recommends that you schedule a follow-up appointment in: First week in December   Any Other Special Instructions Will Be Listed Below (If Applicable).   If you need a refill on your cardiac medications before your next appointment, please call your pharmacy.      Signed, Thurmon Fair, MD  11/18/2016 11:39 AM    [facility] [address] [place], [place], [place]  [address] Phone: [phone]; Fax: [phone]

## 2016-11-27 ENCOUNTER — Other Ambulatory Visit: Payer: Self-pay | Admitting: Cardiovascular Disease

## 2016-11-29 DIAGNOSIS — Z23 Encounter for immunization: Secondary | ICD-10-CM | POA: Diagnosis not present

## 2016-11-30 DIAGNOSIS — R110 Nausea: Secondary | ICD-10-CM | POA: Diagnosis not present

## 2016-11-30 DIAGNOSIS — K580 Irritable bowel syndrome with diarrhea: Secondary | ICD-10-CM | POA: Diagnosis not present

## 2016-11-30 DIAGNOSIS — R1013 Epigastric pain: Secondary | ICD-10-CM | POA: Diagnosis not present

## 2016-12-13 DIAGNOSIS — Z23 Encounter for immunization: Secondary | ICD-10-CM | POA: Diagnosis not present

## 2016-12-25 ENCOUNTER — Emergency Department (HOSPITAL_BASED_OUTPATIENT_CLINIC_OR_DEPARTMENT_OTHER): Payer: BLUE CROSS/BLUE SHIELD

## 2016-12-25 ENCOUNTER — Emergency Department (HOSPITAL_BASED_OUTPATIENT_CLINIC_OR_DEPARTMENT_OTHER)
Admission: EM | Admit: 2016-12-25 | Discharge: 2016-12-25 | Disposition: A | Discharge status: Home / Self Care | Payer: BLUE CROSS/BLUE SHIELD | Attending: Emergency Medicine | Admitting: Emergency Medicine

## 2016-12-25 ENCOUNTER — Encounter (HOSPITAL_BASED_OUTPATIENT_CLINIC_OR_DEPARTMENT_OTHER): Payer: Self-pay | Admitting: Emergency Medicine

## 2016-12-25 DIAGNOSIS — T426X5A Adverse effect of other antiepileptic and sedative-hypnotic drugs, initial encounter: Secondary | ICD-10-CM | POA: Diagnosis not present

## 2016-12-25 DIAGNOSIS — Y639 Failure in dosage during unspecified surgical and medical care: Secondary | ICD-10-CM | POA: Diagnosis not present

## 2016-12-25 DIAGNOSIS — T887XXA Unspecified adverse effect of drug or medicament, initial encounter: Secondary | ICD-10-CM | POA: Diagnosis not present

## 2016-12-25 DIAGNOSIS — M79605 Pain in left leg: Secondary | ICD-10-CM | POA: Diagnosis not present

## 2016-12-25 DIAGNOSIS — R2681 Unsteadiness on feet: Secondary | ICD-10-CM | POA: Diagnosis not present

## 2016-12-25 DIAGNOSIS — T50905A Adverse effect of unspecified drugs, medicaments and biological substances, initial encounter: Secondary | ICD-10-CM

## 2016-12-25 DIAGNOSIS — R269 Unspecified abnormalities of gait and mobility: Secondary | ICD-10-CM | POA: Diagnosis not present

## 2016-12-25 DIAGNOSIS — Z79899 Other long term (current) drug therapy: Secondary | ICD-10-CM | POA: Diagnosis not present

## 2016-12-25 DIAGNOSIS — I509 Heart failure, unspecified: Secondary | ICD-10-CM | POA: Insufficient documentation

## 2016-12-25 DIAGNOSIS — M79662 Pain in left lower leg: Secondary | ICD-10-CM | POA: Diagnosis not present

## 2016-12-25 DIAGNOSIS — I2510 Atherosclerotic heart disease of native coronary artery without angina pectoris: Secondary | ICD-10-CM | POA: Diagnosis not present

## 2016-12-25 DIAGNOSIS — T391X5A Adverse effect of 4-Aminophenol derivatives, initial encounter: Secondary | ICD-10-CM | POA: Diagnosis not present

## 2016-12-25 DIAGNOSIS — R4182 Altered mental status, unspecified: Secondary | ICD-10-CM | POA: Diagnosis not present

## 2016-12-25 HISTORY — DX: Other psychoactive substance dependence, in remission: F19.21

## 2016-12-25 LAB — CBC WITH DIFFERENTIAL/PLATELET
Basophils Absolute: 0 10*3/uL (ref 0.0–0.1)
Basophils Relative: 0 %
EOS PCT: 1 %
Eosinophils Absolute: 0.1 10*3/uL (ref 0.0–0.7)
HEMATOCRIT: 41.6 % (ref 39.0–52.0)
Hemoglobin: 14.7 g/dL (ref 13.0–17.0)
LYMPHS ABS: 1.7 10*3/uL (ref 0.7–4.0)
LYMPHS PCT: 23 %
MCH: 31 pg (ref 26.0–34.0)
MCHC: 35.3 g/dL (ref 30.0–36.0)
MCV: 87.8 fL (ref 78.0–100.0)
MONO ABS: 0.8 10*3/uL (ref 0.1–1.0)
Monocytes Relative: 12 %
Neutro Abs: 4.7 10*3/uL (ref 1.7–7.7)
Neutrophils Relative %: 64 %
Platelets: 212 10*3/uL (ref 150–400)
RBC: 4.74 MIL/uL (ref 4.22–5.81)
RDW: 13 % (ref 11.5–15.5)
WBC: 7.3 10*3/uL (ref 4.0–10.5)

## 2016-12-25 LAB — COMPREHENSIVE METABOLIC PANEL
ALBUMIN: 4 g/dL (ref 3.5–5.0)
ALK PHOS: 58 U/L (ref 38–126)
ALT: 40 U/L (ref 17–63)
ANION GAP: 7 (ref 5–15)
AST: 27 U/L (ref 15–41)
BILIRUBIN TOTAL: 0.8 mg/dL (ref 0.3–1.2)
BUN: 24 mg/dL — ABNORMAL HIGH (ref 6–20)
CALCIUM: 9.2 mg/dL (ref 8.9–10.3)
CO2: 27 mmol/L (ref 22–32)
CREATININE: 0.92 mg/dL (ref 0.61–1.24)
Chloride: 103 mmol/L (ref 101–111)
GFR calc non Af Amer: >60 mL/min (ref >60)
GLUCOSE: 96 mg/dL (ref 65–99)
Potassium: 4 mmol/L (ref 3.5–5.1)
Sodium: 137 mmol/L (ref 135–145)
TOTAL PROTEIN: 6.7 g/dL (ref 6.5–8.1)

## 2016-12-25 LAB — RAPID URINE DRUG SCREEN, HOSP PERFORMED
AMPHETAMINES: NOT DETECTED
BARBITURATES: NOT DETECTED
BENZODIAZEPINES: POSITIVE — AB
COCAINE: NOT DETECTED
Opiates: POSITIVE — AB
TETRAHYDROCANNABINOL: NOT DETECTED

## 2016-12-25 LAB — URINALYSIS, ROUTINE W REFLEX MICROSCOPIC
Bilirubin Urine: NEGATIVE
GLUCOSE, UA: NEGATIVE mg/dL
Hgb urine dipstick: NEGATIVE
Ketones, ur: NEGATIVE mg/dL
LEUKOCYTES UA: NEGATIVE
Nitrite: NEGATIVE
PH: 6 (ref 5.0–8.0)
Protein, ur: NEGATIVE mg/dL
SPECIFIC GRAVITY, URINE: 1.02 (ref 1.005–1.030)

## 2016-12-25 LAB — ACETAMINOPHEN LEVEL: Acetaminophen (Tylenol), Serum: <10 ug/mL — ABNORMAL LOW (ref 10–30)

## 2016-12-25 LAB — ETHANOL: Alcohol, Ethyl (B): <5 mg/dL

## 2016-12-25 LAB — SALICYLATE LEVEL: Salicylate Lvl: <7 mg/dL (ref 2.8–30.0)

## 2016-12-25 MED ORDER — KETOROLAC TROMETHAMINE 60 MG/2ML IM SOLN
60.0000 mg | Freq: Once | INTRAMUSCULAR | Status: AC
  Administered 2016-12-25: 60 mg via INTRAMUSCULAR
  Filled 2016-12-25: qty 2

## 2016-12-25 NOTE — ED Notes (Signed)
After a lengthy conversation with Dr Anitra Lauth, patient, patient's brother Annette Stable), and this nurse. Patient was agreeable to going home with his brother despite feeling "wobbly" and being "fearful of falling". Patient education at discharge included fall safety in the home in the presence of the patient and his brother. Patient was given a urinal and no skid socks at discharge. Patient was taken to car via wheelchair, minimal assist to get into car.

## 2016-12-25 NOTE — ED Notes (Signed)
Spoke to patient's brother Annette Stable), requested him to return to [facility] to sit with his brother as patient is having a hard time following staff request to not get up without assistance. Annette Stable states he will return, it will take @ 45 minutes to get back here. Charge nurse updated.

## 2016-12-25 NOTE — ED Notes (Signed)
Patient with left anterior thigh hip, states it was hurting a few days ago, resolved, he slipped and fell in the tub last night and woke up with a worse pain. Patient also c/o sacrum pain, hx of same, seen by [facility], states this started in January and resolved after @ 3 months, and returned about 6 weeks ago. States he called [facility] and was told to use Epsom Salt soaks, states he was planning to call them back for an appointment but had other things to take place preventing that from being scheduled.   Brother at bedside.

## 2016-12-25 NOTE — ED Notes (Signed)
Patient states he is now willing to go to XR.   MD updated on situation and delay on imaging.

## 2016-12-25 NOTE — ED Triage Notes (Signed)
L upper leg pain x 2 days, denies injury.

## 2016-12-25 NOTE — ED Notes (Signed)
Documentation charted by Charna Elizabeth, RN from [phone] was actually done by this nurse. Charted under wrong used.

## 2016-12-25 NOTE — ED Notes (Signed)
Patient was requested to provide a urine sample. Patient was advised he can not attempt to stand, as he states that he can not stand. Nurse exited room for privacy, patient noticed below edge of curtain patient's feet we on the floor, sitting on the end of the bed. Patient was reminded he is not to attempt standing without permission. Patient states "how am I supposed to pee then?". Patient was given a urinal and advised he would have to pee into the urinal from a seated position. Patient was advised this nurse will remain in the room with him during this time due to his recent fall. Patient states "can we consider plan B?", patient was advised plan B would include a catheter. Patient states "well, clearly something is wrong with me cause I can pee." MD updated. Awaiting additional orders.

## 2016-12-25 NOTE — ED Provider Notes (Signed)
[facility] Provider Note   CSN: [id number] Arrival date & time: 12/25/16  1503     History   Chief Complaint Chief Complaint  Patient presents with  . Leg Pain    HPI Alejandro Ramos is a 61 y.o. male.  Patient is a 61 year old male with a significant past medical history for coronary artery disease, CHF, chronic prostatitis, left bundle branch block, sleep apnea, chronic sacral pain who presents today with severe left thigh pain. Patient states that a few days ago he slipped in the bathroom and fell in the bathtub. At that time his leg didn't hurt but over the next day or 2 he developed severe pain in his left mid thigh area. He has no pain in his hip or knee. He denies any redness or swelling of the leg as well as no bruising. He denies any numbness or weakness of his leg. No difficulty having bowel movements and no urinary retention. He states his sacral pain has also become worse but it is the same pain he's had in the past. No significant leg swelling and no prior history of PE. Patient admits to taking Tylenol, gabapentin, Ativan and using ice for his leg today. However he states the pain is still 9 out of 10 and sharp. No particular movements seem to make the pain worse and it does not radiate.   The history is provided by the patient and a relative.    Past Medical History:  Diagnosis Date  . Back pain, chronic 12/03/2011  . CAD (coronary artery disease)   . CHF (congestive heart failure) (HCC)   . Chronic prostatitis   . 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 H/O: drug dependency (HCC)   . IBS (irritable bowel syndrome)   . Kidney stone   . LBBB (left bundle branch block)   . OSA (obstructive sleep apnea)   . Sleep apnea 12/03/2011   Has a C-PAP machine, but does not use it.  . TMJ syndrome 12/03/2011    Patient Active Problem List   Diagnosis Date Noted  . Chronic insomnia 01/25/2016  . Chronic combined systolic  and diastolic heart failure (HCC) 01/06/2016  . Cardiomyopathy (HCC) 01/06/2016  . LBBB (left bundle branch block) 01/06/2016  . Pre-diabetes 01/06/2016  . CAD (coronary artery disease) 12/10/2015  . Mixed hyperlipidemia 12/10/2015  . Severe obesity (BMI 35.0-35.9 with comorbidity) (HCC) 12/10/2015  . OSA (obstructive sleep apnea) 12/10/2015  . Syncope 12/10/2015  . Anxiety 03/07/2012  . Alcohol dependence (HCC) 02/28/2012    Past Surgical History:  Procedure Laterality Date  . CARDIAC CATHETERIZATION N/A 01/12/2016   Procedure: Right/Left Heart Cath and Coronary Angiography;  Surgeon: Thurmon Fair, MD; RCA 15%, LM 25-30%, LVEDP 12 mm Hg, PWCP mean 14 mm Hg, PA 35/20, mean 26 mm Hg, CO 4.9 L/min, CI 2 L/min (sq)       Home Medications    Prior to Admission medications   Medication Sig Start Date End Date Taking? Authorizing Provider  acetaminophen (TYLENOL) 500 MG tablet Take 1,000 mg by mouth every 6 (six) hours as needed for moderate pain or headache.    [provider]  bisoprolol (ZEBETA) 5 MG tablet Take 5 mg by mouth daily.    [provider]  ENTRESTO 24-26 MG TAKE 1 TABLET TWICE A DAY 11/28/16   Croitoru, Mihai, MD  finasteride (PROSCAR) 5 MG tablet Take 5 mg by mouth daily.  01/18/16   [provider]  gabapentin (NEURONTIN) 600 MG tablet Take 1,800 mg by mouth at bedtime.     [provider]  Multiple Vitamins-Minerals (MENS 50+ MULTI VITAMIN/MIN PO) Take 1 tablet by mouth daily.    [provider]  ondansetron (ZOFRAN) 4 MG tablet Take 1 tablet by mouth daily as needed. 01/15/16   [provider]  oxyCODONE-acetaminophen (PERCOCET) 5-325 MG tablet Take 1 tablet by mouth every 4 (four) hours as needed (for pain). 08/19/16   Molpus, Bashar, MD  QUEtiapine (SEROQUEL) 25 MG tablet Take 25 mg by mouth 3 (three) times daily as needed (for nausea). SOMETIMES 4 TIMES A DAY    [provider]  ramelteon (ROZEREM) 8 MG  tablet Take 8 mg by mouth at bedtime.    [provider]  spironolactone (ALDACTONE) 25 MG tablet TAKE 0.5 TABLETS (12.5 MG TOTAL) BY MOUTH ONCE DAILY. 10/31/16   Croitoru, Mihai, MD  Testosterone (AXIRON) 30 MG/ACT SOLN PLACE (30 MG) ONTO THE SKIN DAILY 01/14/16   [provider]    Family History Family History  Problem Relation Age of Onset  . Paranoid behavior Father   . Congestive Heart Failure Father   . Arrhythmia Mother        has a PPM  . Heart attack Maternal Grandmother        20s  . Arrhythmia Maternal Grandfather        42s  . Heart attack Paternal Grandmother 80  . Heart attack Paternal Grandfather 32    Social History Social History  Substance Use Topics  . Smoking status: Never Smoker  . Smokeless tobacco: Never Used  . Alcohol use No     Allergies   Patient has no known allergies.   Review of Systems Review of Systems  Psychiatric/Behavioral:       Pt states that he has had a lot on his plate recently and he is done.  Stating he is not sleeping well and a lot of stress which is weighing on him.  All other systems reviewed and are negative.    Physical Exam Updated Vital Signs BP 116/76 (BP Location: Right Arm)   Pulse 86   Temp 98.3 F (36.8 C) (Oral)   Resp 20   SpO2 96%   Physical Exam  Constitutional: He is oriented to person, place, and time. He appears well-developed and well-nourished. No distress.  Patient seems groggy. He is able to answer all questions appropriately but seems very sleepy.  HENT:  Head: Normocephalic and atraumatic.  Mouth/Throat: Oropharynx is clear and moist.  Dry mucous membranes with dried white powder on his lips  Eyes: Pupils are equal, round, and reactive to light. Conjunctivae and EOM are normal.  Neck: Normal range of motion. Neck supple.  Cardiovascular: Normal rate, regular rhythm and intact distal pulses.   No murmur heard. Pulmonary/Chest: Effort normal and breath sounds normal. No  respiratory distress. He has no wheezes. He has no rales.  Abdominal: Soft. He exhibits no distension. There is no tenderness. There is no rebound and no guarding.  Genitourinary:  Genitourinary Comments: Skin on the scrotum is normal without evidence of infection  Musculoskeletal: Normal range of motion. He exhibits no edema or tenderness.  Full range of motion of the left leg. No pain reproduced with hip flexion or extension. Skin of the leg is normal in appearance. There is no induration, crepitus, fluctuance or erythema of the left upper leg. Tenderness is palpated along the medial thigh but no  palpable hematomas or muscle spasm. Pain is reproduced with palpation. There is no left lower extremity swelling and 2+ pulse. Capillary refill is less than 2 seconds.  Tenderness of the sacrum but no visual abnormalities.  Neurological: He is oriented to person, place, and time.  Sleepy but able to answer questions appropriately and following commands  Skin: Skin is warm and dry. No rash noted. No erythema.  Psychiatric: He has a normal mood and affect. His behavior is normal.  Nursing note and vitals reviewed.    ED Treatments / Results  Labs (all labs ordered are listed, but only abnormal results are displayed) Labs Reviewed  COMPREHENSIVE METABOLIC PANEL - Abnormal; Notable for the following:       Result Value   BUN 24 (*)    All other components within normal limits  RAPID URINE DRUG SCREEN, HOSP PERFORMED - Abnormal; Notable for the following:    Opiates POSITIVE (*)    Benzodiazepines POSITIVE (*)    All other components within normal limits  ACETAMINOPHEN LEVEL - Abnormal; Notable for the following:    Acetaminophen (Tylenol), Serum <10 (*)    All other components within normal limits  CBC WITH DIFFERENTIAL/PLATELET  URINALYSIS, ROUTINE W REFLEX MICROSCOPIC  ETHANOL  SALICYLATE LEVEL    EKG  EKG Interpretation None       Radiology Ct Head Wo Contrast  Result Date:  12/25/2016 CLINICAL DATA:  61 year old male with altered level of consciousness, confusion and lower extremity weakness. EXAM: CT HEAD WITHOUT CONTRAST TECHNIQUE: Contiguous axial images were obtained from the base of the skull through the vertex without intravenous contrast. COMPARISON:  None. FINDINGS: Brain: No evidence of acute infarction, hemorrhage, hydrocephalus, extra-axial collection or mass lesion/mass effect. Atrophy and mild chronic small-vessel white matter ischemic changes noted. Vascular: Mild intracranial atherosclerotic vascular calcifications noted. Skull: Normal. Negative for fracture or focal lesion. Sinuses/Orbits: No acute finding. Other: None. IMPRESSION: 1. No evidence of acute intracranial abnormality. 2. Atrophy and mild chronic small-vessel white matter ischemic changes. Electronically Signed   By: Harmon Pier M.D.   On: 12/25/2016 19:46   US Venous Img Lower  Left (dvt Study)  Result Date: 12/25/2016 CLINICAL DATA:  Recent fall.  Left hip and thigh pain for 2 days. EXAM: LEFT LOWER EXTREMITY VENOUS DOPPLER ULTRASOUND TECHNIQUE: Gray-scale sonography with graded compression, as well as color Doppler and duplex ultrasound were performed to evaluate the lower extremity deep venous systems from the level of the common femoral vein and including the common femoral, femoral, profunda femoral, popliteal and calf veins including the posterior tibial, peroneal and gastrocnemius veins when visible. The superficial great saphenous vein was also interrogated. Spectral Doppler was utilized to evaluate flow at rest and with distal augmentation maneuvers in the common femoral, femoral and popliteal veins. COMPARISON:  None. FINDINGS: Contralateral Common Femoral Vein: Respiratory phasicity is normal and symmetric with the symptomatic side. No evidence of thrombus. Normal compressibility. Common Femoral Vein: No evidence of thrombus. Normal compressibility, respiratory phasicity and response to  augmentation. Saphenofemoral Junction: No evidence of thrombus. Normal compressibility and flow on color Doppler imaging. Profunda Femoral Vein: No evidence of thrombus. Normal compressibility and flow on color Doppler imaging. Femoral Vein: No evidence of thrombus. Normal compressibility, respiratory phasicity and response to augmentation. Popliteal Vein: No evidence of thrombus. Normal compressibility, respiratory phasicity and response to augmentation. Calf Veins: No evidence of thrombus. Normal compressibility and flow on color Doppler imaging. Superficial Great Saphenous Vein: No evidence of thrombus. Normal compressibility  and flow on color Doppler imaging. Venous Reflux:  None. Other Findings: Medial left popliteal fossa 3.8 x 0.9 x 2.1 cm cystic structure with no internal vascularity, compatible with a Baker's cyst. IMPRESSION: No evidence of DVT within the left lower extremity. Left-sided Baker's cyst measuring 3.8 x 0.9 x 2.1 cm. Electronically Signed   By: Delbert Phenix M.D.   On: 12/25/2016 18:26   Dg Femur Min 2 Views Left  Result Date: 12/25/2016 CLINICAL DATA:  Acute onset left leg pain x5 days without known injury. EXAM: LEFT FEMUR 2 VIEWS COMPARISON:  CT abdomen and pelvis from 08/19/2016 FINDINGS: Joint space narrowing of the left hip along its weight-bearing portion. Bony protuberances again seen off the superolateral aspect of the femoral head- neck junction which may reflect femoroacetabular impingement morphology of CAM variety. This may predispose to labral tears and chondrosis of the left hip. Mild femorotibial joint space narrowing of the included knee. No acute fracture of the femur. No joint effusion is noted. IMPRESSION: 1. Degenerative joint space narrowing of the left hip and knee. 2. 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 This may predispose  the patient to labral tears and chondrosis of left hip. Electronically Signed   By: Tollie Eth M.D.   On: 12/25/2016 18:00    Procedures Procedures (including critical care time)  Medications Ordered in ED Medications  ketorolac (TORADOL) injection 60 mg (not administered)     Initial Impression / Assessment and Plan / ED Course  I have reviewed the triage vital signs and the nursing notes.  Pertinent labs & imaging results that were available during my care of the patient were reviewed by me and considered in my medical decision making (see chart for details).     Patient is a 61 year old male presenting today complaining of right medial thigh pain. There is no external sign of injury or palpable concern for hematoma, abscess or necrotizing fasciitis. Patient has no erythema, fever or crepitus. Patient has normal perfusion with 2+ DP pulse and normal capillary refill. There is no swelling in the leg. Pain is not worse with range of motion of the leg. He denies any hip pain and no signs of Fournier's gangrene. Patient is groggy on exam but after further investigation he has taken 2 Ativan today in addition to gabapentin which is most likely the cause of him being unsteady on his feet and groggy. Patient has taken Tylenol for pain as well and states it has not improved his pain. Pain may be a result of a muscular pain after falling in the bathtub 2 days ago. There does not seem to be contusion or hematoma causing the pain. He denies any back pain but possibility could be radicular. Patient given a dose of Toradol. Will hold off on muscle relaxers given patient's sleepy state. X-ray of the left femur and DVT study pending  12:16 AM Patient's DVT study and x-ray without acute findings. Findings were discussed with the patient and initially he felt okay to go home. However he still up and stated that he was very shaky and did not feel comfortable and did not feel that he could walk. Patient  continues to be groggy hearing concern for polypharmacy. The patient's brother who is not clear what is happening at home as the patient lives alone. He is concerned. Low suspicion for self-harm at this time. However given  patient's altered mental status will do a further workup including blood work and CT of the head. CT showed no acute concerning findings. Alcohol was within normal limits, CMP, CBC, UA, acetaminophen on within normal limits. UDS was positive for opiates and benzodiazepines. Despite asking the patient multiple times if he had taken narcotics he denied. Feel the combination of narcotics, benzodiazepines, gabapentin these all could be the cause of him feeling wobbly, groggy and not himself. Patient was in the department approximately 6 hours and mental status the Foley seems to be improving but not back to baseline. Patient was able to stand T or L walk approximately 20 feet without assistance. Patient's brother is present and discussed all findings with him. Patient's brother has agreed to take him home with him to ensure that he is safe overnight. Encouraged recheck in the morning and if patient is still having issues after medicine has had time to wear off that he will need further evaluation and possibly MRI. Patient was encouraged to not take any further medications. After Toradol patient's leg pain has completely resolved.  Final Clinical Impressions(s) / ED Diagnoses   Final diagnoses:  Unsteady gait  Medication reaction, initial encounter  Left leg pain    New Prescriptions Discharge Medication List as of 12/25/2016 10:11 PM       Gwyneth Sprout, MD 12/26/16 [id number]

## 2016-12-25 NOTE — ED Notes (Signed)
Patient brother leaving at this time. Contact Info:  [address] [phone] (cell) [phone] (home)  Password: [email or web address] (pronounced: Core-Ack)

## 2016-12-25 NOTE — ED Notes (Signed)
Patient transported to CT 

## 2016-12-25 NOTE — ED Notes (Signed)
XR came to pick up patient. Patient refuses to go until he is given something else for pain. Patient request hydrocodone or something comparable in injection form. Patient was advised it is highly unlikely MD will give controlled substances at this time. Patient brother suggests patient to get XR done and give meds a chance to work, patient continues to refuse to go. Patient request to speak with MD. Radiology transport cancelled at this time.

## 2016-12-28 DIAGNOSIS — M533 Sacrococcygeal disorders, not elsewhere classified: Secondary | ICD-10-CM | POA: Diagnosis not present

## 2017-01-03 ENCOUNTER — Other Ambulatory Visit: Payer: Self-pay | Admitting: Sports Medicine

## 2017-01-03 DIAGNOSIS — M79606 Pain in leg, unspecified: Secondary | ICD-10-CM | POA: Diagnosis not present

## 2017-01-03 DIAGNOSIS — W19XXXA Unspecified fall, initial encounter: Secondary | ICD-10-CM | POA: Diagnosis not present

## 2017-01-03 DIAGNOSIS — R7303 Prediabetes: Secondary | ICD-10-CM | POA: Diagnosis not present

## 2017-01-03 DIAGNOSIS — M533 Sacrococcygeal disorders, not elsewhere classified: Secondary | ICD-10-CM

## 2017-01-03 DIAGNOSIS — F39 Unspecified mood [affective] disorder: Secondary | ICD-10-CM | POA: Diagnosis not present

## 2017-01-05 DIAGNOSIS — M1990 Unspecified osteoarthritis, unspecified site: Secondary | ICD-10-CM | POA: Diagnosis not present

## 2017-01-11 DIAGNOSIS — M79661 Pain in right lower leg: Secondary | ICD-10-CM | POA: Diagnosis not present

## 2017-01-11 DIAGNOSIS — M79662 Pain in left lower leg: Secondary | ICD-10-CM | POA: Diagnosis not present

## 2017-01-16 ENCOUNTER — Other Ambulatory Visit: Payer: BLUE CROSS/BLUE SHIELD

## 2017-01-25 DIAGNOSIS — M25552 Pain in left hip: Secondary | ICD-10-CM | POA: Diagnosis not present

## 2017-01-25 DIAGNOSIS — M25562 Pain in left knee: Secondary | ICD-10-CM | POA: Diagnosis not present

## 2017-01-25 DIAGNOSIS — M1712 Unilateral primary osteoarthritis, left knee: Secondary | ICD-10-CM | POA: Diagnosis not present

## 2017-01-25 DIAGNOSIS — M1612 Unilateral primary osteoarthritis, left hip: Secondary | ICD-10-CM | POA: Diagnosis not present

## 2017-01-26 ENCOUNTER — Encounter: Payer: Self-pay | Admitting: Cardiovascular Disease

## 2017-01-27 ENCOUNTER — Telehealth: Payer: Self-pay | Admitting: *Deleted

## 2017-01-27 DIAGNOSIS — F5104 Psychophysiologic insomnia: Secondary | ICD-10-CM

## 2017-01-27 NOTE — Telephone Encounter (Signed)
Patient has been called and message left. Per Dr. Royann Shivers the patient may have a referral to [facility], Dr. Porfirio Mylar, for his insomnia concerns. The patient had sent a mychart e-mail (please see epic notes). A referral has been sent in per Dr. Erin Hearing request. Patient has been instructed to call back if he has any further questions.

## 2017-01-30 ENCOUNTER — Observation Stay (HOSPITAL_COMMUNITY)
Admission: EM | Admit: 2017-01-30 | Discharge: 2017-02-01 | Disposition: A | Discharge status: Home / Self Care | Payer: BLUE CROSS/BLUE SHIELD | Attending: Family Medicine | Admitting: Family Medicine

## 2017-01-30 ENCOUNTER — Encounter (HOSPITAL_COMMUNITY): Payer: Self-pay | Admitting: Emergency Medicine

## 2017-01-30 ENCOUNTER — Emergency Department (HOSPITAL_COMMUNITY): Payer: BLUE CROSS/BLUE SHIELD

## 2017-01-30 DIAGNOSIS — R69 Illness, unspecified: Secondary | ICD-10-CM

## 2017-01-30 DIAGNOSIS — I447 Left bundle-branch block, unspecified: Secondary | ICD-10-CM | POA: Diagnosis not present

## 2017-01-30 DIAGNOSIS — Z79899 Other long term (current) drug therapy: Secondary | ICD-10-CM | POA: Insufficient documentation

## 2017-01-30 DIAGNOSIS — R9431 Abnormal electrocardiogram [ECG] [EKG]: Secondary | ICD-10-CM | POA: Diagnosis not present

## 2017-01-30 DIAGNOSIS — F419 Anxiety disorder, unspecified: Secondary | ICD-10-CM | POA: Insufficient documentation

## 2017-01-30 DIAGNOSIS — F339 Major depressive disorder, recurrent, unspecified: Secondary | ICD-10-CM | POA: Diagnosis not present

## 2017-01-30 DIAGNOSIS — I2510 Atherosclerotic heart disease of native coronary artery without angina pectoris: Secondary | ICD-10-CM | POA: Insufficient documentation

## 2017-01-30 DIAGNOSIS — Z87442 Personal history of urinary calculi: Secondary | ICD-10-CM | POA: Diagnosis not present

## 2017-01-30 DIAGNOSIS — F13931 Sedative, hypnotic or anxiolytic use, unspecified with withdrawal delirium: Secondary | ICD-10-CM

## 2017-01-30 DIAGNOSIS — I5042 Chronic combined systolic (congestive) and diastolic (congestive) heart failure: Secondary | ICD-10-CM | POA: Diagnosis not present

## 2017-01-30 DIAGNOSIS — F13231 Sedative, hypnotic or anxiolytic dependence with withdrawal delirium: Principal | ICD-10-CM | POA: Insufficient documentation

## 2017-01-30 DIAGNOSIS — E782 Mixed hyperlipidemia: Secondary | ICD-10-CM | POA: Insufficient documentation

## 2017-01-30 DIAGNOSIS — Z6834 Body mass index (BMI) 34.0-34.9, adult: Secondary | ICD-10-CM | POA: Diagnosis not present

## 2017-01-30 DIAGNOSIS — F13239 Sedative, hypnotic or anxiolytic dependence with withdrawal, unspecified: Secondary | ICD-10-CM

## 2017-01-30 DIAGNOSIS — F5104 Psychophysiologic insomnia: Secondary | ICD-10-CM | POA: Diagnosis not present

## 2017-01-30 DIAGNOSIS — R030 Elevated blood-pressure reading, without diagnosis of hypertension: Secondary | ICD-10-CM | POA: Diagnosis not present

## 2017-01-30 DIAGNOSIS — Z8249 Family history of ischemic heart disease and other diseases of the circulatory system: Secondary | ICD-10-CM | POA: Insufficient documentation

## 2017-01-30 DIAGNOSIS — M549 Dorsalgia, unspecified: Secondary | ICD-10-CM | POA: Diagnosis not present

## 2017-01-30 DIAGNOSIS — D751 Secondary polycythemia: Secondary | ICD-10-CM | POA: Insufficient documentation

## 2017-01-30 DIAGNOSIS — K589 Irritable bowel syndrome without diarrhea: Secondary | ICD-10-CM | POA: Insufficient documentation

## 2017-01-30 DIAGNOSIS — F13939 Sedative, hypnotic or anxiolytic use, unspecified with withdrawal, unspecified: Secondary | ICD-10-CM | POA: Diagnosis present

## 2017-01-30 DIAGNOSIS — I429 Cardiomyopathy, unspecified: Secondary | ICD-10-CM | POA: Diagnosis not present

## 2017-01-30 DIAGNOSIS — G8929 Other chronic pain: Secondary | ICD-10-CM | POA: Diagnosis not present

## 2017-01-30 DIAGNOSIS — G4733 Obstructive sleep apnea (adult) (pediatric): Secondary | ICD-10-CM | POA: Insufficient documentation

## 2017-01-30 DIAGNOSIS — R0600 Dyspnea, unspecified: Secondary | ICD-10-CM | POA: Diagnosis not present

## 2017-01-30 DIAGNOSIS — F1320 Sedative, hypnotic or anxiolytic dependence, uncomplicated: Secondary | ICD-10-CM | POA: Diagnosis present

## 2017-01-30 LAB — CBC WITH DIFFERENTIAL/PLATELET
BASOS ABS: 0 10*3/uL (ref 0.0–0.1)
Basophils Relative: 0 %
EOS ABS: 0 10*3/uL (ref 0.0–0.7)
Eosinophils Relative: 0 %
HEMATOCRIT: 47.1 % (ref 39.0–52.0)
Hemoglobin: 17.4 g/dL — ABNORMAL HIGH (ref 13.0–17.0)
LYMPHS ABS: 1.5 10*3/uL (ref 0.7–4.0)
Lymphocytes Relative: 13 %
MCH: 31.6 pg (ref 26.0–34.0)
MCHC: 36.9 g/dL — AB (ref 30.0–36.0)
MCV: 85.6 fL (ref 78.0–100.0)
MONO ABS: 1.2 10*3/uL — AB (ref 0.1–1.0)
MONOS PCT: 10 %
NEUTROS ABS: 9.2 10*3/uL — AB (ref 1.7–7.7)
Neutrophils Relative %: 77 %
PLATELETS: 258 10*3/uL (ref 150–400)
RBC: 5.5 MIL/uL (ref 4.22–5.81)
RDW: 12.7 % (ref 11.5–15.5)
WBC: 11.9 10*3/uL — ABNORMAL HIGH (ref 4.0–10.5)

## 2017-01-30 LAB — I-STAT CG4 LACTIC ACID, ED: LACTIC ACID, VENOUS: 4.07 mmol/L — AB (ref 0.5–1.9)

## 2017-01-30 LAB — COMPREHENSIVE METABOLIC PANEL
ALBUMIN: 4.6 g/dL (ref 3.5–5.0)
ALK PHOS: 71 U/L (ref 38–126)
ALT: 34 U/L (ref 17–63)
ANION GAP: 15 (ref 5–15)
AST: 32 U/L (ref 15–41)
BILIRUBIN TOTAL: 1.5 mg/dL — AB (ref 0.3–1.2)
BUN: 21 mg/dL — AB (ref 6–20)
CALCIUM: 10.3 mg/dL (ref 8.9–10.3)
CO2: 17 mmol/L — AB (ref 22–32)
CREATININE: 0.95 mg/dL (ref 0.61–1.24)
Chloride: 109 mmol/L (ref 101–111)
GFR calc Af Amer: >60 mL/min (ref >60)
GFR calc non Af Amer: >60 mL/min (ref >60)
GLUCOSE: 109 mg/dL — AB (ref 65–99)
Potassium: 3.7 mmol/L (ref 3.5–5.1)
SODIUM: 141 mmol/L (ref 135–145)
TOTAL PROTEIN: 8.1 g/dL (ref 6.5–8.1)

## 2017-01-30 LAB — I-STAT TROPONIN, ED: Troponin i, poc: 0 ng/mL (ref 0.00–0.08)

## 2017-01-30 LAB — URINALYSIS, ROUTINE W REFLEX MICROSCOPIC
BACTERIA UA: NONE SEEN
BILIRUBIN URINE: NEGATIVE
Glucose, UA: NEGATIVE mg/dL
HGB URINE DIPSTICK: NEGATIVE
KETONES UR: 80 mg/dL — AB
LEUKOCYTES UA: NEGATIVE
Nitrite: NEGATIVE
Protein, ur: 30 mg/dL — AB
SPECIFIC GRAVITY, URINE: 1.02 (ref 1.005–1.030)
SQUAMOUS EPITHELIAL / LPF: NONE SEEN
pH: 8 (ref 5.0–8.0)

## 2017-01-30 LAB — ETHANOL: Alcohol, Ethyl (B): <10 mg/dL (ref <10)

## 2017-01-30 LAB — ACETAMINOPHEN LEVEL

## 2017-01-30 LAB — RAPID URINE DRUG SCREEN, HOSP PERFORMED
Amphetamines: NOT DETECTED
Barbiturates: NOT DETECTED
Benzodiazepines: NOT DETECTED
COCAINE: NOT DETECTED
OPIATES: NOT DETECTED
TETRAHYDROCANNABINOL: NOT DETECTED

## 2017-01-30 LAB — PROTIME-INR
INR: 1.03
PROTHROMBIN TIME: 13.5 s (ref 11.4–15.2)

## 2017-01-30 LAB — SALICYLATE LEVEL: Salicylate Lvl: <7 mg/dL (ref 2.8–30.0)

## 2017-01-30 MED ORDER — LORAZEPAM 2 MG/ML IJ SOLN
1.0000 mg | Freq: Once | INTRAMUSCULAR | Status: AC
  Administered 2017-01-31: 1 mg via INTRAVENOUS
  Filled 2017-01-30: qty 1

## 2017-01-30 MED ORDER — SODIUM CHLORIDE 0.9 % IV BOLUS (SEPSIS)
500.0000 mL | Freq: Once | INTRAVENOUS | Status: AC
  Administered 2017-01-31: 500 mL via INTRAVENOUS

## 2017-01-30 NOTE — ED Provider Notes (Signed)
[facility]-EMERGENCY DEPT Provider Note   CSN: [id number] Arrival date & time: 01/30/17  2426     History   Chief Complaint No chief complaint on file.   HPI Alejandro Ramos is a 61 y.o. male.  HPI Patient has been taking up to 10 Ambien per day. This was just discovered today in the patient's therapy sessions. Patient's family members were aware that he was having addictive problems and have been trying to assist. The patient reports that he has at times been taking as many as 10 Ambien per day. Family members have been trying to encourage him to decrease this and seek treatment. Patient reports he had been getting the medications online and thus was able to take excessive quantities. He has not now taken any since yesterday and has started getting tremors excessive shaking and feeling confused. He reports he has vomited several times today. Patient denies alcohol use. He reports he does have problems with chronic back pain and for muscle spasms as well. He however is denying at this time any recent use of opioids or obtaining other illegal medications. Patient also has history significant for cardiomyopathy with congestive heart failure. Patient does report he is concerned because he's been feeling more short of breath just over the past day. He is worried about developing congestive heart failure as well as the symptoms of withdrawal that he has been having. Past Medical History:  Diagnosis Date  . Back pain, chronic 12/03/2011  . CAD (coronary artery disease)   . CHF (congestive heart failure) (HCC)   . Chronic prostatitis   . 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 H/O: drug dependency (HCC)   . IBS (irritable bowel syndrome)   . Kidney stone   . LBBB (left bundle branch block)   . OSA (obstructive sleep apnea)   . Sleep apnea 12/03/2011   Has a C-PAP machine, but does not use it.  . TMJ syndrome 12/03/2011    Patient Active Problem List   Diagnosis Date Noted  . Ambien use disorder, severe (HCC) 01/31/2017  . Chronic insomnia 01/25/2016  . Chronic combined systolic and diastolic heart failure (HCC) 01/06/2016  . Cardiomyopathy (HCC) 01/06/2016  . LBBB (left bundle branch block) 01/06/2016  . Pre-diabetes 01/06/2016  . CAD (coronary artery disease) 12/10/2015  . Mixed hyperlipidemia 12/10/2015  . Severe obesity (BMI 35.0-35.9 with comorbidity) (HCC) 12/10/2015  . OSA (obstructive sleep apnea) 12/10/2015  . Syncope 12/10/2015  . Anxiety 03/07/2012  . Alcohol dependence (HCC) 02/28/2012  . Back pain, chronic 12/03/2011    Past Surgical History:  Procedure Laterality Date  . CARDIAC CATHETERIZATION N/A 01/12/2016   Procedure: Right/Left Heart Cath and Coronary Angiography;  Surgeon: Thurmon Fair, MD; RCA 15%, LM 25-30%, LVEDP 12 mm Hg, PWCP mean 14 mm Hg, PA 35/20, mean 26 mm Hg, CO 4.9 L/min, CI 2 L/min (sq)       Home Medications    Prior to Admission medications   Medication Sig Start Date End Date Taking? Authorizing Provider  bisoprolol (ZEBETA) 5 MG tablet Take 2.5 mg by mouth every evening.    Yes [provider]  DULoxetine (CYMBALTA) 60 MG capsule TAKE 1 CAPSULE (60 MG TOTAL) BY MOUTH ONCE DAILY. 01/22/17  Yes [provider]  finasteride (PROSCAR) 5 MG tablet Take 5 mg by mouth daily.  01/18/16  Yes [provider]  gabapentin (NEURONTIN) 600 MG tablet Take 600 mg by mouth 3 (three) times daily.  Yes [provider]  hydrOXYzine (ATARAX/VISTARIL) 50 MG tablet 1-2 TABLETS BY MOUTH ONE HOUR BEFORE SLEEP 01/22/17  Yes [provider]  QUEtiapine (SEROQUEL) 25 MG tablet Take 25 mg by mouth 3 (three) times daily as needed (for nausea). SOMETIMES 4 TIMES A DAY   Yes [provider]  spironolactone (ALDACTONE) 25 MG tablet TAKE 0.5 TABLETS (12.5 MG TOTAL) BY MOUTH ONCE DAILY. 10/31/16  Yes Croitoru, Mihai, MD  tiZANidine (ZANAFLEX) 2 MG tablet Take 2 mg by mouth  every 8 (eight) hours as needed for muscle spasms.  01/26/17  Yes [provider]  zolpidem (AMBIEN) 10 MG tablet  01/05/17  Yes [provider]  acetaminophen (TYLENOL) 500 MG tablet Take 1,000 mg by mouth every 6 (six) hours as needed for moderate pain or headache.    [provider]  ENTRESTO 24-26 MG TAKE 1 TABLET TWICE A DAY 11/28/16   Croitoru, Mihai, MD  LORazepam (ATIVAN) 1 MG tablet  11/21/16   [provider]  Multiple Vitamins-Minerals (MENS 50+ MULTI VITAMIN/MIN PO) Take 1 tablet by mouth daily.    [provider]  naproxen (NAPROSYN) 500 MG tablet Take 500 mg by mouth 3 (three) times daily. 12/30/16   [provider]  omeprazole (PRILOSEC) 40 MG capsule TAKE ONE CAPSULE BY MOUTH TWICE A DAY 30 MINUTES BEFORE BREAKFAST AND DINNER 01/05/17   [provider]  oxyCODONE-acetaminophen (PERCOCET) 5-325 MG tablet Take 1 tablet by mouth every 4 (four) hours as needed (for pain). 08/19/16   Molpus, Kunal, MD  predniSONE (DELTASONE) 10 MG tablet 40 MG BY MOUTH DAILY FOR 5 DAYS 01/05/17   [provider]  Testosterone (AXIRON) 30 MG/ACT SOLN PLACE (30 MG) ONTO THE SKIN DAILY 01/14/16   [provider]    Family History Family History  Problem Relation Age of Onset  . Paranoid behavior Father   . Congestive Heart Failure Father   . Arrhythmia Mother        has a PPM  . Heart attack Maternal Grandmother        86s  . Arrhythmia Maternal Grandfather        61s  . Heart attack Paternal Grandmother 98  . Heart attack Paternal Grandfather 13    Social History Social History  Substance Use Topics  . Smoking status: Never Smoker  . Smokeless tobacco: Never Used  . Alcohol use No     Allergies   Patient has no known allergies.   Review of Systems Review of Systems 10 Systems reviewed and are negative for acute change except as noted in the HPI.   Physical Exam Updated Vital Signs BP (!) 157/90   Pulse (!)  105   Temp 98.6 F (37 C) (Oral)   Resp (!) 29   SpO2 99%   Physical Exam  Constitutional: He is oriented to person, place, and time.  Patient is lying on his side. He has tachypnea Patient is diaphoretic. He is alert but tremulous. Speech is pressured and somewhat tangential.  HENT:  Head: Normocephalic and atraumatic.  Mucous memory slightly dry.  Eyes: EOM are normal.  Pupils 5 mm.  Cardiovascular:  Tachycardia. No gross rub murmur gallop. Distal pulses intact.  Pulmonary/Chest:  Tachypnea but no gross rales or wheeze.  Abdominal: Soft. He exhibits no distension.  Musculoskeletal: Normal range of motion. He exhibits no edema or tenderness.  Neurological: He is alert and oriented to person, place, and time.  Patient is oriented but speech  is somewhat short and occasionally goes off subject. Movements are coordinated purposeful the patient is tremulous.  Skin: Skin is warm.  Diaphoretic.  Psychiatric:  Patient is extremely anxious.     ED Treatments / Results  Labs (all labs ordered are listed, but only abnormal results are displayed) Labs Reviewed  COMPREHENSIVE METABOLIC PANEL - Abnormal; Notable for the following:       Result Value   CO2 17 (*)    Glucose, Bld 109 (*)    BUN 21 (*)    Total Bilirubin 1.5 (*)    All other components within normal limits  ACETAMINOPHEN LEVEL - Abnormal; Notable for the following:    Acetaminophen (Tylenol), Serum <10 (*)    All other components within normal limits  CBC WITH DIFFERENTIAL/PLATELET - Abnormal; Notable for the following:    WBC 11.9 (*)    Hemoglobin 17.4 (*)    MCHC 36.9 (*)    Neutro Abs 9.2 (*)    Monocytes Absolute 1.2 (*)    All other components within normal limits  URINALYSIS, ROUTINE W REFLEX MICROSCOPIC - Abnormal; Notable for the following:    Ketones, ur 80 (*)    Protein, ur 30 (*)    All other components within normal limits  I-STAT CG4 LACTIC ACID, ED - Abnormal; Notable for the following:     Lactic Acid, Venous 4.07 (*)    All other components within normal limits  ETHANOL  SALICYLATE LEVEL  PROTIME-INR  RAPID URINE DRUG SCREEN, HOSP PERFORMED  I-STAT TROPONIN, ED    EKG  EKG Interpretation  Date/Time:  Monday January 30 2017 22:39:23 EDT Ventricular Rate:  102 PR Interval:    QRS Duration: 144 QT Interval:  390 QTC Calculation: 508 R Axis:   75 Text Interpretation:  Sinus tachycardia Left bundle branch block Baseline wander in lead(s) V3 V5 V6 When compared with ECG of 01/12/2016, No significant change was found Confirmed by Dione Booze ([id number]) on 01/30/2017 11:37:07 PM       Radiology Dg Chest 2 View  Result Date: 01/30/2017 CLINICAL DATA:  Dyspnea for 24 hours. EXAM: CHEST  2 VIEW COMPARISON:  12/09/2015 FINDINGS: The lungs are clear. The pulmonary vasculature is normal. Heart size is normal. Hilar and mediastinal contours are unremarkable. There is no pleural effusion. IMPRESSION: No active cardiopulmonary disease. Electronically Signed   By: Ellery Plunk M.D.   On: 01/30/2017 22:35    Procedures Procedures (including critical care time) CRITICAL CARE Performed by: Arby Barrette   Total critical care time:30 minutes  Critical care time was exclusive of separately billable procedures and treating other patients.  Critical care was necessary to treat or prevent imminent or life-threatening deterioration.  Critical care was time spent personally by me on the following activities: development of treatment plan with patient and/or surrogate as well as nursing, discussions with consultants, evaluation of patient's response to treatment, examination of patient, obtaining history from patient or surrogate, ordering and performing treatments and interventions, ordering and review of laboratory studies, ordering and review of radiographic studies, pulse oximetry and re-evaluation of patient's condition. Medications Ordered in ED Medications  LORazepam  (ATIVAN) injection 2 mg (not administered)  sodium chloride 0.9 % bolus 500 mL (500 mLs Intravenous New Bag/Given 01/31/17 0001)  LORazepam (ATIVAN) injection 1 mg (1 mg Intravenous Given 01/31/17 0002)     Initial Impression / Assessment and Plan / ED Course  I have reviewed the triage vital signs and the nursing notes.  Pertinent  labs & imaging results that were available during my care of the patient were reviewed by me and considered in my medical decision making (see chart for details).    Consult: Reviewed with poison control. Patient's usage history of zolpidem illegally obtained and symptoms and past medical history reviewed. Recommendation was for use of as needed benzodiazepine for agitation\withdrawal symptoms particularly in the context of patient's comorbid illnesses.  Consult: Reviewed with Dr.Opyd for admission.  Recheck: Patient continues to be agitated and tremulous after Ativan 1 mg IV. At this time, will proceed with Ativan 2 mg IV.  Final Clinical Impressions(s) / ED Diagnoses   Final diagnoses:  Sedative withdrawal delirium (HCC)  Severe comorbid illness  Patient has been abusing zolpidem and high doses daily. Family members and the patient's counselor have just recently become aware of the level of abuse. Ambien was abruptly discontinued and patient has developed extreme agitation, tremulousness and episodes of confusion. No seizure activity has been observed. Poison control was consulted in regards to anticipated severity and management of zolpidem withdrawal. Advice given was for observation with when necessary management of agitation with benzodiazepine. Family members are supportive and have identified potential treatment centers once patient is stabilized.  New Prescriptions New Prescriptions   No medications on file     Arby Barrette, MD 01/31/17 [phone]

## 2017-01-30 NOTE — ED Triage Notes (Signed)
Per [facility] patient from home for anxiety and "having withdrawals from Ambien".  Reports takes around 5 Ambien daily over the past couple years.  Patient's therapist was on scene and patient upset with therapist due to her holding patient's Ambien from him. Vitals: 160/100 100HR, 20R, 99% on rrom air. CbG 102.

## 2017-01-30 NOTE — ED Notes (Signed)
While putting Pt on monitor, Pt is constantly shaking and diaphoretic. Undressed Pt due to excess sweating and to get on gown, Pt's Ambien fell out of pocket, Pt's sister has in her possession now.

## 2017-01-31 ENCOUNTER — Encounter (HOSPITAL_COMMUNITY): Payer: Self-pay | Admitting: Family Medicine

## 2017-01-31 DIAGNOSIS — F13939 Sedative, hypnotic or anxiolytic use, unspecified with withdrawal, unspecified: Secondary | ICD-10-CM | POA: Diagnosis present

## 2017-01-31 DIAGNOSIS — G8929 Other chronic pain: Secondary | ICD-10-CM | POA: Diagnosis not present

## 2017-01-31 DIAGNOSIS — F4321 Adjustment disorder with depressed mood: Secondary | ICD-10-CM | POA: Diagnosis not present

## 2017-01-31 DIAGNOSIS — F1320 Sedative, hypnotic or anxiolytic dependence, uncomplicated: Secondary | ICD-10-CM | POA: Diagnosis present

## 2017-01-31 DIAGNOSIS — R110 Nausea: Secondary | ICD-10-CM | POA: Diagnosis not present

## 2017-01-31 DIAGNOSIS — F332 Major depressive disorder, recurrent severe without psychotic features: Secondary | ICD-10-CM | POA: Diagnosis not present

## 2017-01-31 DIAGNOSIS — M549 Dorsalgia, unspecified: Secondary | ICD-10-CM | POA: Diagnosis not present

## 2017-01-31 DIAGNOSIS — G4700 Insomnia, unspecified: Secondary | ICD-10-CM

## 2017-01-31 DIAGNOSIS — I5042 Chronic combined systolic (congestive) and diastolic (congestive) heart failure: Secondary | ICD-10-CM | POA: Diagnosis not present

## 2017-01-31 DIAGNOSIS — F13239 Sedative, hypnotic or anxiolytic dependence with withdrawal, unspecified: Secondary | ICD-10-CM | POA: Diagnosis not present

## 2017-01-31 DIAGNOSIS — F419 Anxiety disorder, unspecified: Secondary | ICD-10-CM | POA: Diagnosis not present

## 2017-01-31 DIAGNOSIS — I509 Heart failure, unspecified: Secondary | ICD-10-CM

## 2017-01-31 DIAGNOSIS — I2510 Atherosclerotic heart disease of native coronary artery without angina pectoris: Secondary | ICD-10-CM | POA: Diagnosis not present

## 2017-01-31 LAB — BASIC METABOLIC PANEL
Anion gap: 15 (ref 5–15)
BUN: 22 mg/dL — ABNORMAL HIGH (ref 6–20)
CALCIUM: 9.4 mg/dL (ref 8.9–10.3)
CO2: 16 mmol/L — AB (ref 22–32)
CREATININE: 0.93 mg/dL (ref 0.61–1.24)
Chloride: 110 mmol/L (ref 101–111)
GFR calc Af Amer: >60 mL/min (ref >60)
GLUCOSE: 113 mg/dL — AB (ref 65–99)
Potassium: 3.4 mmol/L — ABNORMAL LOW (ref 3.5–5.1)
Sodium: 141 mmol/L (ref 135–145)

## 2017-01-31 LAB — CBC
HEMATOCRIT: 43.1 % (ref 39.0–52.0)
Hemoglobin: 15.8 g/dL (ref 13.0–17.0)
MCH: 31.4 pg (ref 26.0–34.0)
MCHC: 36.7 g/dL — AB (ref 30.0–36.0)
MCV: 85.7 fL (ref 78.0–100.0)
PLATELETS: 242 10*3/uL (ref 150–400)
RBC: 5.03 MIL/uL (ref 4.22–5.81)
RDW: 13.1 % (ref 11.5–15.5)
WBC: 11.7 10*3/uL — ABNORMAL HIGH (ref 4.0–10.5)

## 2017-01-31 LAB — LACTIC ACID, PLASMA: Lactic Acid, Venous: 2.6 mmol/L (ref 0.5–1.9)

## 2017-01-31 LAB — HIV ANTIBODY (ROUTINE TESTING W REFLEX): HIV SCREEN 4TH GENERATION: NONREACTIVE

## 2017-01-31 LAB — TSH: TSH: 3.889 u[IU]/mL (ref 0.350–4.500)

## 2017-01-31 MED ORDER — LORAZEPAM 2 MG/ML IJ SOLN
2.0000 mg | INTRAMUSCULAR | Status: DC | PRN
  Administered 2017-01-31: 2 mg via INTRAVENOUS
  Filled 2017-01-31: qty 1

## 2017-01-31 MED ORDER — ACETAMINOPHEN 650 MG RE SUPP: 650.0000 mg | Freq: Four times a day (QID) | RECTAL | Status: DC | PRN

## 2017-01-31 MED ORDER — NAPROXEN 250 MG PO TABS
500.0000 mg | ORAL_TABLET | Freq: Three times a day (TID) | ORAL | Status: DC
Start: 2017-01-31 — End: 2017-02-01
  Administered 2017-01-31 – 2017-02-01 (×4): 500 mg via ORAL
  Filled 2017-01-31 (×4): qty 2

## 2017-01-31 MED ORDER — ENOXAPARIN SODIUM 40 MG/0.4ML ~~LOC~~ SOLN
40.0000 mg | SUBCUTANEOUS | Status: DC
  Administered 2017-01-31 – 2017-02-01 (×2): 40 mg via SUBCUTANEOUS
  Filled 2017-01-31 (×2): qty 0.4

## 2017-01-31 MED ORDER — ONDANSETRON HCL 4 MG/2ML IJ SOLN
4.0000 mg | Freq: Four times a day (QID) | INTRAMUSCULAR | Status: DC | PRN
  Administered 2017-01-31 – 2017-02-01 (×2): 4 mg via INTRAVENOUS
  Filled 2017-01-31 (×2): qty 2

## 2017-01-31 MED ORDER — CHLORDIAZEPOXIDE HCL 25 MG PO CAPS
25.0000 mg | ORAL_CAPSULE | Freq: Once | ORAL | Status: AC
  Administered 2017-01-31: 25 mg via ORAL
  Filled 2017-01-31: qty 1

## 2017-01-31 MED ORDER — DULOXETINE HCL 30 MG PO CPEP
60.0000 mg | ORAL_CAPSULE | Freq: Every day | ORAL | Status: DC
  Administered 2017-01-31 – 2017-02-01 (×2): 60 mg via ORAL
  Filled 2017-01-31 (×2): qty 2

## 2017-01-31 MED ORDER — GABAPENTIN 300 MG PO CAPS
600.0000 mg | ORAL_CAPSULE | Freq: Three times a day (TID) | ORAL | Status: DC
  Administered 2017-01-31 – 2017-02-01 (×4): 600 mg via ORAL
  Filled 2017-01-31 (×4): qty 2

## 2017-01-31 MED ORDER — HYDROXYZINE HCL 25 MG PO TABS
50.0000 mg | ORAL_TABLET | Freq: Every day | ORAL | Status: DC
Start: 2017-01-31 — End: 2017-02-01
  Administered 2017-01-31 (×2): 50 mg via ORAL
  Filled 2017-01-31 (×2): qty 2

## 2017-01-31 MED ORDER — QUETIAPINE FUMARATE 25 MG PO TABS
50.0000 mg | ORAL_TABLET | Freq: Every day | ORAL | Status: DC
  Administered 2017-01-31 (×2): 50 mg via ORAL
  Filled 2017-01-31: qty 1
  Filled 2017-01-31: qty 2

## 2017-01-31 MED ORDER — SODIUM CHLORIDE 0.9% FLUSH
3.0000 mL | Freq: Two times a day (BID) | INTRAVENOUS | Status: DC
  Administered 2017-02-01: 3 mL via INTRAVENOUS

## 2017-01-31 MED ORDER — ACETAMINOPHEN 325 MG PO TABS
650.0000 mg | ORAL_TABLET | Freq: Four times a day (QID) | ORAL | Status: DC | PRN
  Administered 2017-01-31: 650 mg via ORAL
  Filled 2017-01-31: qty 2

## 2017-01-31 MED ORDER — BISOPROLOL FUMARATE 5 MG PO TABS
2.5000 mg | ORAL_TABLET | Freq: Every evening | ORAL | Status: DC
  Administered 2017-01-31: 2.5 mg via ORAL
  Filled 2017-01-31: qty 1

## 2017-01-31 MED ORDER — OXYCODONE-ACETAMINOPHEN 5-325 MG PO TABS
1.0000 | ORAL_TABLET | ORAL | Status: DC | PRN
  Administered 2017-01-31 – 2017-02-01 (×3): 1 via ORAL
  Filled 2017-01-31 (×4): qty 1

## 2017-01-31 MED ORDER — PANTOPRAZOLE SODIUM 40 MG PO TBEC
40.0000 mg | DELAYED_RELEASE_TABLET | Freq: Every day | ORAL | Status: DC
  Administered 2017-01-31 – 2017-02-01 (×2): 40 mg via ORAL
  Filled 2017-01-31 (×2): qty 1

## 2017-01-31 MED ORDER — ONDANSETRON HCL 4 MG PO TABS
4.0000 mg | ORAL_TABLET | Freq: Four times a day (QID) | ORAL | Status: DC | PRN
Start: 2017-01-31 — End: 2017-02-01

## 2017-01-31 MED ORDER — ENOXAPARIN SODIUM 40 MG/0.4ML ~~LOC~~ SOLN: 40.0000 mg | SUBCUTANEOUS | Status: DC

## 2017-01-31 MED ORDER — TIZANIDINE HCL 4 MG PO TABS
2.0000 mg | ORAL_TABLET | Freq: Three times a day (TID) | ORAL | Status: DC | PRN
  Administered 2017-01-31 – 2017-02-01 (×3): 2 mg via ORAL
  Filled 2017-01-31 (×3): qty 1

## 2017-01-31 MED ORDER — SODIUM CHLORIDE 0.9 % IV SOLN
INTRAVENOUS | Status: AC
  Administered 2017-01-31: 01:00:00 via INTRAVENOUS

## 2017-01-31 MED ORDER — SACUBITRIL-VALSARTAN 24-26 MG PO TABS
1.0000 | ORAL_TABLET | Freq: Two times a day (BID) | ORAL | Status: DC
  Administered 2017-01-31 – 2017-02-01 (×3): 1 via ORAL
  Filled 2017-01-31 (×3): qty 1

## 2017-01-31 MED ORDER — FINASTERIDE 5 MG PO TABS
5.0000 mg | ORAL_TABLET | Freq: Every day | ORAL | Status: DC
  Administered 2017-01-31 – 2017-02-01 (×2): 5 mg via ORAL
  Filled 2017-01-31 (×2): qty 1

## 2017-01-31 MED ORDER — SENNOSIDES-DOCUSATE SODIUM 8.6-50 MG PO TABS: 1.0000 | ORAL_TABLET | Freq: Every evening | ORAL | Status: DC | PRN

## 2017-01-31 MED ORDER — LORAZEPAM 2 MG/ML IJ SOLN
2.0000 mg | Freq: Once | INTRAMUSCULAR | Status: AC
  Administered 2017-01-31: 2 mg via INTRAVENOUS
  Filled 2017-01-31: qty 1

## 2017-01-31 MED ORDER — BISOPROLOL FUMARATE 5 MG PO TABS
2.5000 mg | ORAL_TABLET | Freq: Once | ORAL | Status: AC
  Administered 2017-01-31: 2.5 mg via ORAL
  Filled 2017-01-31: qty 0.5

## 2017-01-31 MED ORDER — DIAZEPAM 5 MG PO TABS
5.0000 mg | ORAL_TABLET | Freq: Four times a day (QID) | ORAL | Status: DC | PRN
  Administered 2017-01-31 (×2): 10 mg via ORAL
  Administered 2017-02-01: 5 mg via ORAL
  Filled 2017-01-31: qty 1
  Filled 2017-01-31 (×2): qty 2

## 2017-01-31 NOTE — Progress Notes (Signed)
Attempted to complete nursing admission history. Pt states he is doing something important now and does not want to answer questions.  Briscoe Burns BSN, RN-BC Admissions RN 01/31/2017 4:04 PM

## 2017-01-31 NOTE — Consult Note (Addendum)
[place] [facility] Consult   Reason for Consult:  Ambien abuse Referring Physician:  Dr. Bonner Puna Patient Identification: Alejandro Ramos MRN:  [id number] Principal Diagnosis: Ambien use disorder, severe [facility]) Diagnosis:   Patient Active Problem List   Diagnosis Date Noted  . Ambien use disorder, severe ([place]) [F13.20] 01/31/2017  . Withdrawal from sedative drug ([place]) [F13.239] 01/31/2017  . Ambien use disorder, severe, dependence ([place]) [F13.20] 01/31/2017  . Chronic insomnia [F51.04] 01/25/2016  . Chronic combined systolic and diastolic heart failure ([place]) [I50.42] 01/06/2016  . Cardiomyopathy ([place]) [I42.9] 01/06/2016  . LBBB (left bundle branch block) [I44.7] 01/06/2016  . Pre-diabetes [R73.03] 01/06/2016  . CAD (coronary artery disease) [I25.10] 12/10/2015  . Mixed hyperlipidemia [E78.2] 12/10/2015  . Severe obesity (BMI 35.0-35.9 with comorbidity) ([place]) [E66.01, Z68.35] 12/10/2015  . OSA (obstructive sleep apnea) [G47.33] 12/10/2015  . Syncope [R55] 12/10/2015  . Anxiety [F41.9] 03/07/2012  . Alcohol dependence ([place]) [F10.20] 02/28/2012  . Back pain, chronic [M54.9, G89.29] 12/03/2011    Total Time spent with patient: 1 hour  Subjective:   Alejandro Ramos is a 61 y.o. male patient admitted with ambien withdrawal.  HPI:  Alejandro Ramos is a 61 y.o. male with a history of CAD, chronic CHF and depression admitted to [facility] with severe symptoms of ambien withdrawal. He was restarted on ambien by his psychiatrist about 9 months ago and since then has had escalating daily use up to 6-10 of the 64m tablets daily, obtained online. His father died 2 weeks ago causing him more stress and increased ambien use. Patient appeared awake, anxious, restless, tremulous, increased heart rate, nausea, sweating, tearful and back pain. Reportedly he has blackouts and his therapist recommended in patient admission for ongoing excessive ambien abuse and dependence and withdrawal symptoms. Patient  sister MCPOA and her husband has been supportive and reportedly placed a IVC petition and working on long term - 30 day residential rehab treatment after medically stable. Patient has been [facility]. Patient denied current suicide or homicide ideation, intention or plans. He has no evidence of psychosis.  Past Psychiatric History: Depression, and anxiety. [place]    Risk to Self: Is patient at risk for suicide?: No Risk to Others:   Prior Inpatient Therapy:   Prior Outpatient Therapy:    Past Medical History:  Past Medical History:  Diagnosis Date  . Back pain, chronic 12/03/2011  . CAD (coronary artery disease)   . CHF (congestive heart failure) ([place]   . Chronic prostatitis   . 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 H/O: drug dependency ([place]   . IBS (irritable bowel syndrome)   . Kidney stone   . LBBB (left bundle branch block)   . OSA (obstructive sleep apnea)   . Sleep apnea 12/03/2011   Has a C-PAP machine, but does not use it.  . 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 CARDIAC CATHETERIZATION N/A 01/12/2016   Procedure: Right/Left Heart Cath and Coronary Angiography;  Surgeon: MSanda Klein MD; RCA 15%, LM 25-30%, LVEDP 12 mm Hg, PWCP mean 14 mm Hg, PA 35/20, mean 26 mm Hg, CO 4.9 L/min, CI 2 L/min (sq)   Family History:  Family History  Problem Relation Age of Onset  . Paranoid behavior Father   . Congestive Heart Failure Father   . Arrhythmia Mother  has a PPM  . Heart attack Maternal Grandmother        80s  . Arrhythmia Maternal Grandfather        76s  . Heart attack Paternal Grandmother 93  . Heart attack Paternal Grandfather 50   Family Psychiatric  History: Unknown Social History:  History  Alcohol Use No     History  Drug Use No    Social History   Social  History  . Marital status: Divorced    Spouse name: N/A  . Number of children: N/A  . Years of education: N/A   Social History Main Topics  . Smoking status: Never Smoker  . Smokeless tobacco: Never Used  . Alcohol use No  . Drug use: No  . Sexual activity: Not Currently   Other Topics Concern  . None   Social History Narrative  . None   Additional Social History:    Allergies:  No Known Allergies  Labs:  Results for orders placed or performed during the hospital encounter of 01/30/17 (from the past 48 hour(s))  Comprehensive metabolic panel     Status: Abnormal   Collection Time: 01/30/17 10:40 PM  Result Value Ref Range   Sodium 141 135 - 145 mmol/L   Potassium 3.7 3.5 - 5.1 mmol/L   Chloride 109 101 - 111 mmol/L   CO2 17 (L) 22 - 32 mmol/L   Glucose, Bld 109 (H) 65 - 99 mg/dL   BUN 21 (H) 6 - 20 mg/dL   Creatinine, Ser 0.95 0.61 - 1.24 mg/dL   Calcium 10.3 8.9 - 10.3 mg/dL   Total Protein 8.1 6.5 - 8.1 g/dL   Albumin 4.6 3.5 - 5.0 g/dL   AST 32 15 - 41 U/L   ALT 34 17 - 63 U/L   Alkaline Phosphatase 71 38 - 126 U/L   Total Bilirubin 1.5 (H) 0.3 - 1.2 mg/dL   GFR calc non Af Amer >60 >60 mL/min   GFR calc Af Amer >60 >60 mL/min    Comment: (NOTE) The eGFR has been calculated using the CKD EPI equation. This calculation has not been validated in all clinical situations. eGFR's persistently <60 mL/min signify possible Chronic Kidney Disease.    Anion gap 15 5 - 15  Ethanol     Status: None   Collection Time: 01/30/17 10:40 PM  Result Value Ref Range   Alcohol, Ethyl (B) <10 <10 mg/dL    Comment:        LOWEST DETECTABLE LIMIT FOR SERUM ALCOHOL IS 10 mg/dL FOR MEDICAL PURPOSES ONLY Please note change in reference range.   Salicylate level     Status: None   Collection Time: 01/30/17 10:40 PM  Result Value Ref Range   Salicylate Lvl <0.6 2.8 - 30.0 mg/dL  Acetaminophen level     Status: Abnormal   Collection Time: 01/30/17 10:40 PM  Result Value Ref  Range   Acetaminophen (Tylenol), Serum <10 (L) 10 - 30 ug/mL    Comment:        THERAPEUTIC CONCENTRATIONS VARY SIGNIFICANTLY. A RANGE OF 10-30 ug/mL MAY BE AN EFFECTIVE CONCENTRATION FOR MANY PATIENTS. HOWEVER, SOME ARE BEST TREATED AT CONCENTRATIONS OUTSIDE THIS RANGE. ACETAMINOPHEN CONCENTRATIONS >150 ug/mL AT 4 HOURS AFTER INGESTION AND >50 ug/mL AT 12 HOURS AFTER INGESTION ARE OFTEN ASSOCIATED WITH TOXIC REACTIONS.   CBC with Differential     Status: Abnormal   Collection Time: 01/30/17 10:40 PM  Result Value Ref Range   WBC 11.9 (H)  4.0 - 10.5 K/uL   RBC 5.50 4.22 - 5.81 MIL/uL   Hemoglobin 17.4 (H) 13.0 - 17.0 g/dL   HCT 47.1 39.0 - 52.0 %   MCV 85.6 78.0 - 100.0 fL   MCH 31.6 26.0 - 34.0 pg   MCHC 36.9 (H) 30.0 - 36.0 g/dL   RDW 12.7 11.5 - 15.5 %   Platelets 258 150 - 400 K/uL   Neutrophils Relative % 77 %   Lymphocytes Relative 13 %   Monocytes Relative 10 %   Eosinophils Relative 0 %   Basophils Relative 0 %   Neutro Abs 9.2 (H) 1.7 - 7.7 K/uL   Lymphs Abs 1.5 0.7 - 4.0 K/uL   Monocytes Absolute 1.2 (H) 0.1 - 1.0 K/uL   Eosinophils Absolute 0.0 0.0 - 0.7 K/uL   Basophils Absolute 0.0 0.0 - 0.1 K/uL   Smear Review MORPHOLOGY UNREMARKABLE   Protime-INR     Status: None   Collection Time: 01/30/17 10:40 PM  Result Value Ref Range   Prothrombin Time 13.5 11.4 - 15.2 seconds   INR 1.03   Urinalysis, Routine w reflex microscopic     Status: Abnormal   Collection Time: 01/30/17 10:46 PM  Result Value Ref Range   Color, Urine YELLOW YELLOW   APPearance CLEAR CLEAR   Specific Gravity, Urine 1.020 1.005 - 1.030   pH 8.0 5.0 - 8.0   Glucose, UA NEGATIVE NEGATIVE mg/dL   Hgb urine dipstick NEGATIVE NEGATIVE   Bilirubin Urine NEGATIVE NEGATIVE   Ketones, ur 80 (A) NEGATIVE mg/dL   Protein, ur 30 (A) NEGATIVE mg/dL   Nitrite NEGATIVE NEGATIVE   Leukocytes, UA NEGATIVE NEGATIVE   RBC / HPF 0-5 0 - 5 RBC/hpf   WBC, UA 0-5 0 - 5 WBC/hpf   Bacteria, UA NONE SEEN  NONE SEEN   Squamous Epithelial / LPF NONE SEEN NONE SEEN   Mucus PRESENT    Hyaline Casts, UA PRESENT   Urine rapid drug screen (hosp performed)     Status: None   Collection Time: 01/30/17 10:46 PM  Result Value Ref Range   Opiates NONE DETECTED NONE DETECTED   Cocaine NONE DETECTED NONE DETECTED   Benzodiazepines NONE DETECTED NONE DETECTED   Amphetamines NONE DETECTED NONE DETECTED   Tetrahydrocannabinol NONE DETECTED NONE DETECTED   Barbiturates NONE DETECTED NONE DETECTED    Comment:        DRUG SCREEN FOR MEDICAL PURPOSES ONLY.  IF CONFIRMATION IS NEEDED FOR ANY PURPOSE, NOTIFY LAB WITHIN 5 DAYS.        LOWEST DETECTABLE LIMITS FOR URINE DRUG SCREEN Drug Class       Cutoff (ng/mL) Amphetamine      1000 Barbiturate      200 Benzodiazepine   681 Tricyclics       157 Opiates          300 Cocaine          300 THC              50   I-stat troponin, ED     Status: None   Collection Time: 01/30/17 10:53 PM  Result Value Ref Range   Troponin i, poc 0.00 0.00 - 0.08 ng/mL   Comment 3            Comment: Due to the release kinetics of cTnI, a negative result within the first hours of the onset of symptoms does not rule out myocardial infarction with certainty. If myocardial infarction  is still suspected, repeat the test at appropriate intervals.   I-Stat CG4 Lactic Acid, ED     Status: Abnormal   Collection Time: 01/30/17 10:55 PM  Result Value Ref Range   Lactic Acid, Venous 4.07 (HH) 0.5 - 1.9 mmol/L   Comment NOTIFIED PHYSICIAN   CBC     Status: Abnormal   Collection Time: 01/31/17  5:59 AM  Result Value Ref Range   WBC 11.7 (H) 4.0 - 10.5 K/uL   RBC 5.03 4.22 - 5.81 MIL/uL   Hemoglobin 15.8 13.0 - 17.0 g/dL   HCT 43.1 39.0 - 52.0 %   MCV 85.7 78.0 - 100.0 fL   MCH 31.4 26.0 - 34.0 pg   MCHC 36.7 (H) 30.0 - 36.0 g/dL   RDW 13.1 11.5 - 15.5 %   Platelets 242 150 - 400 K/uL  Basic metabolic panel     Status: Abnormal   Collection Time: 01/31/17  5:59 AM   Result Value Ref Range   Sodium 141 135 - 145 mmol/L   Potassium 3.4 (L) 3.5 - 5.1 mmol/L   Chloride 110 101 - 111 mmol/L   CO2 16 (L) 22 - 32 mmol/L   Glucose, Bld 113 (H) 65 - 99 mg/dL   BUN 22 (H) 6 - 20 mg/dL   Creatinine, Ser 0.93 0.61 - 1.24 mg/dL   Calcium 9.4 8.9 - 10.3 mg/dL   GFR calc non Af Amer >60 >60 mL/min   GFR calc Af Amer >60 >60 mL/min    Comment: (NOTE) The eGFR has been calculated using the CKD EPI equation. This calculation has not been validated in all clinical situations. eGFR's persistently <60 mL/min signify possible Chronic Kidney Disease.    Anion gap 15 5 - 15  Lactic acid, plasma     Status: Abnormal   Collection Time: 01/31/17  5:59 AM  Result Value Ref Range   Lactic Acid, Venous 2.6 (HH) 0.5 - 1.9 mmol/L    Comment: CRITICAL RESULT CALLED TO, READ BACK BY AND VERIFIED WITH: E.COGGIN RN [id number] A.QUIZON   TSH     Status: None   Collection Time: 01/31/17  6:00 AM  Result Value Ref Range   TSH 3.889 0.350 - 4.500 uIU/mL    Comment: Performed by a 3rd Generation assay with a functional sensitivity of <=0.01 uIU/mL.    Current Facility-Administered Medications  Medication Dose Route Frequency Provider Last Rate Last Dose  . acetaminophen (TYLENOL) tablet 650 mg  650 mg Oral Q6H PRN Opyd, Ilene Qua, MD       Or  . acetaminophen (TYLENOL) suppository 650 mg  650 mg Rectal Q6H PRN Opyd, Ilene Qua, MD      . bisoprolol (ZEBETA) tablet 2.5 mg  2.5 mg Oral QPM Opyd, Ilene Qua, MD      . diazepam (VALIUM) tablet 5-10 mg  5-10 mg Oral Q6H PRN Patrecia Pour, MD      . DULoxetine (CYMBALTA) DR capsule 60 mg  60 mg Oral Daily Opyd, Ilene Qua, MD   60 mg at 01/31/17 0935  . enoxaparin (LOVENOX) injection 40 mg  40 mg Subcutaneous Q24H Opyd, Ilene Qua, MD   40 mg at 01/31/17 0935  . finasteride (PROSCAR) tablet 5 mg  5 mg Oral Daily Opyd, Ilene Qua, MD   5 mg at 01/31/17 0935  . gabapentin (NEURONTIN) capsule 600 mg  600 mg Oral TID Vianne Bulls, MD    600 mg at 01/31/17 1050  .  hydrOXYzine (ATARAX/VISTARIL) tablet 50 mg  50 mg Oral QHS Opyd, Ilene Qua, MD   50 mg at 01/31/17 0549  . naproxen (NAPROSYN) tablet 500 mg  500 mg Oral Q8H Opyd, Ilene Qua, MD      . ondansetron (ZOFRAN) tablet 4 mg  4 mg Oral Q6H PRN Opyd, Ilene Qua, MD       Or  . ondansetron (ZOFRAN) injection 4 mg  4 mg Intravenous Q6H PRN Opyd, Ilene Qua, MD   4 mg at 01/31/17 0812  . oxyCODONE-acetaminophen (PERCOCET/ROXICET) 5-325 MG per tablet 1 tablet  1 tablet Oral Q4H PRN Opyd, Ilene Qua, MD      . pantoprazole (PROTONIX) EC tablet 40 mg  40 mg Oral Daily Opyd, Ilene Qua, MD   40 mg at 01/31/17 0935  . QUEtiapine (SEROQUEL) tablet 50 mg  50 mg Oral QHS Opyd, Ilene Qua, MD   50 mg at 01/31/17 0549  . sacubitril-valsartan (ENTRESTO) 24-26 mg per tablet  1 tablet Oral BID Opyd, Ilene Qua, MD   1 tablet at 01/31/17 0935  . senna-docusate (Senokot-S) tablet 1 tablet  1 tablet Oral QHS PRN Opyd, Ilene Qua, MD      . sodium chloride flush (NS) 0.9 % injection 3 mL  3 mL Intravenous Q12H Opyd, Ilene Qua, MD      . tiZANidine (ZANAFLEX) tablet 2 mg  2 mg Oral Q8H PRN Opyd, Ilene Qua, MD        Musculoskeletal: Strength & Muscle Tone: decreased Gait & Station: unable to stand Patient leans: N/A  Psychiatric Specialty Exam: Physical Exam as per history and physical  ROS depression, anxiety, insomnia, back pain, nausea, shaking, tremors,sweating and mild visual hallucinations No Fever-chills, No Headache, No changes with Vision or hearing, reports vertigo No problems swallowing food or Liquids, No Chest pain, Cough or Shortness of Breath, No Abdominal pain, No Nausea or Vommitting, Bowel movements are regular, No Blood in stool or Urine, No dysuria, No new skin rashes or bruises, No new joints pains-aches,  No new weakness, tingling, numbness in any extremity, No recent weight gain or loss, No polyuria, polydypsia or polyphagia,  A full 10 point Review of Systems was  done, except as stated above, all other Review of Systems were negative.  Blood pressure (!) 136/91, pulse (!) 118, temperature 99 F (37.2 C), temperature source Oral, resp. rate 20, height 5' 10"  (1.778 m), weight 115.7 kg (255 lb), SpO2 93 %.Body mass index is 36.59 kg/m.  General Appearance: Disheveled and Guarded, morbid obese  Eye Contact:  Good  Speech:  Clear and Coherent and Slow  Volume:  Decreased  Mood:  Anxious and Depressed  Affect:  Constricted and Depressed  Thought Process:  Coherent and Goal Directed  Orientation:  Full (Time, Place, and Person)  Thought Content:  Rumination  Suicidal Thoughts:  No  Homicidal Thoughts:  No  Memory:  Immediate;   Good Recent;   Fair Remote;   Fair  Judgement:  Impaired  Insight:  Fair  Psychomotor Activity:  Restlessness  Concentration:  Concentration: Fair and Attention Span: Fair  Recall:  Good  Fund of Knowledge:  Good  Language:  Good  Akathisia:  Negative  Handed:  Right  AIMS (if indicated):     Assets:  Communication Skills Desire for Improvement Financial Resources/Insurance Housing Leisure Time Resilience Social Support Transportation  ADL's:  Intact  Cognition:  WNL  Sleep:        Treatment Plan Summary: Daily contact  with patient to assess and evaluate symptoms and progress in treatment and Medication management   Ambien use disorder, severe Major depressive disorder, recurrent  Recommendation: Monitor for withdrawal symptoms Continue psychotropic medication Cymbalta for depression, gabapentin for anxiety and Seroquel for agitation Continue Valium for anxiety and insomnia due to withdrawal of ambien Refer to psych LCSW for placement needs  Family is supportive and working on their own for the rehab treatment placement Case discussed with patient sister on phone and her husband at bed side with patient consent  Appreciate psychiatric consultation and follow up as clinically required Please contact  [phone] or [phone] if needs further assistance  Disposition: No evidence of imminent risk to self or others at present.   Patient does not meet criteria for psychiatric inpatient admission. Supportive therapy provided about ongoing stressors. Refer to IOP.  Ambrose Finland, MD 01/31/2017 12:21 PM

## 2017-01-31 NOTE — H&P (Signed)
History and Physical    Alejandro Ramos TSV:[id number] DOB: 1955/08/18 DOA: 01/30/2017  PCP: Mila Palmer, MD   Patient coming from: Home  Chief Complaint: Anxiety, Ambien withdrawal   HPI: Alejandro Ramos is a 61 y.o. male with medical history significant for coronary artery disease, chronic combined systolic/diastolic CHF, and chronic back pain, no percent emergency department for evaluation of a drug problem. Patient has been obtaining Ambien illicitly and using it daily and excessive doses, sometimes up to 10 tablets a day. His family has been trying to help him with his apparent addiction, and he has not taken any doses since yesterday. Today, he developed tremors and intermittent confusion. He has also been experiencing nausea with vomiting. Denies any recent alcohol use. Denies fever or chills. Denies chest pain or palpitations, and denies leg swelling or tenderness.   ED Course: Upon arrival to the ED, patient is found to be afebrile, saturating well on room air, slightly tachycardic, and otherwise stable. EKG features a sinus tachycardia with rate 102 and chronic left bundle branch block. Chest x-ray is negative for acute cardiopulmonary disease. UDS was negative and salicylate, acetaminophen, and ethanol levels were undetectable. Urinalysis is unremarkable. CBC features a slight leukocytosis and a mild polycythemia. Chemistry panel is notable for total bilirubin 1.5 and bicarbonate of 17. Lactic acid was elevated 4.07. Patient was treated with a dose of Ativan and 500 mL normal saline in the ED. He remained agitated, slightly tachycardic, and poison control was contacted by the ED physician. Pleasant control recommended a hospital admission for observation and management with as needed benzodiazepine.  Review of Systems:  All other systems reviewed and apart from HPI, are negative.  Past Medical History:  Diagnosis Date  . Back pain, chronic 12/03/2011  . CAD (coronary artery  disease)   . CHF (congestive heart failure) (HCC)   . Chronic prostatitis   . 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 H/O: drug dependency (HCC)   . IBS (irritable bowel syndrome)   . Kidney stone   . LBBB (left bundle branch block)   . OSA (obstructive sleep apnea)   . Sleep apnea 12/03/2011   Has a C-PAP machine, but does not use it.  . 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 CARDIAC CATHETERIZATION N/A 01/12/2016   Procedure: Right/Left Heart Cath and Coronary Angiography;  Surgeon: Thurmon Fair, MD; RCA 15%, LM 25-30%, LVEDP 12 mm Hg, PWCP mean 14 mm Hg, PA 35/20, mean 26 mm Hg, CO 4.9 L/min, CI 2 L/min (sq)     reports that he has never smoked. He has never used smokeless tobacco. He reports that he does not drink alcohol or use drugs.  No Known Allergies  Family History  Problem Relation Age of Onset  . Paranoid behavior Father   . Congestive Heart Failure Father   . Arrhythmia Mother        has a PPM  . Heart attack Maternal Grandmother        40s  . Arrhythmia Maternal Grandfather        36s  . Heart attack Paternal Grandmother 77  . Heart attack Paternal Grandfather 23     Prior to Admission medications   Medication Sig Start Date End Date Taking? Authorizing Provider  bisoprolol (ZEBETA) 5 MG tablet Take 2.5 mg by mouth every evening.    Yes [provider]  DULoxetine (CYMBALTA) 60 MG capsule TAKE  1 CAPSULE (60 MG TOTAL) BY MOUTH ONCE DAILY. 01/22/17  Yes [provider]  finasteride (PROSCAR) 5 MG tablet Take 5 mg by mouth daily.  01/18/16  Yes [provider]  gabapentin (NEURONTIN) 600 MG tablet Take 600 mg by mouth 3 (three) times daily.    Yes [provider]  hydrOXYzine (ATARAX/VISTARIL) 50 MG tablet 1-2 TABLETS BY MOUTH ONE HOUR BEFORE SLEEP 01/22/17  Yes [provider]  QUEtiapine (SEROQUEL) 25 MG tablet Take 25 mg by mouth 3  (three) times daily as needed (for nausea). SOMETIMES 4 TIMES A DAY   Yes [provider]  spironolactone (ALDACTONE) 25 MG tablet TAKE 0.5 TABLETS (12.5 MG TOTAL) BY MOUTH ONCE DAILY. 10/31/16  Yes Croitoru, Mihai, MD  tiZANidine (ZANAFLEX) 2 MG tablet Take 2 mg by mouth every 8 (eight) hours as needed for muscle spasms.  01/26/17  Yes [provider]  zolpidem (AMBIEN) 10 MG tablet  01/05/17  Yes [provider]  acetaminophen (TYLENOL) 500 MG tablet Take 1,000 mg by mouth every 6 (six) hours as needed for moderate pain or headache.    [provider]  ENTRESTO 24-26 MG TAKE 1 TABLET TWICE A DAY 11/28/16   Croitoru, Mihai, MD  LORazepam (ATIVAN) 1 MG tablet  11/21/16   [provider]  Multiple Vitamins-Minerals (MENS 50+ MULTI VITAMIN/MIN PO) Take 1 tablet by mouth daily.    [provider]  naproxen (NAPROSYN) 500 MG tablet Take 500 mg by mouth 3 (three) times daily. 12/30/16   [provider]  omeprazole (PRILOSEC) 40 MG capsule TAKE ONE CAPSULE BY MOUTH TWICE A DAY 30 MINUTES BEFORE BREAKFAST AND DINNER 01/05/17   [provider]  oxyCODONE-acetaminophen (PERCOCET) 5-325 MG tablet Take 1 tablet by mouth every 4 (four) hours as needed (for pain). 08/19/16   Molpus, Hance, MD  predniSONE (DELTASONE) 10 MG tablet 40 MG BY MOUTH DAILY FOR 5 DAYS 01/05/17   [provider]  Testosterone (AXIRON) 30 MG/ACT SOLN PLACE (30 MG) ONTO THE SKIN DAILY 01/14/16   [provider]    Physical Exam: Vitals:   01/30/17 1909 01/30/17 2113 01/31/17 0002  BP: (!) 143/86 (!) 141/80 (!) 157/90  Pulse: (!) 106 96 (!) 105  Resp: 16 (!) 22 (!) 29  Temp: 98.6 F (37 C) 98.6 F (37 C)   TempSrc: Oral Oral   SpO2: 100% 100% 99%      Constitutional: No respiratory distress. Anxious, disheveled. No pallor. No diaphoresis.  Eyes: PERTLA, lids and conjunctivae normal ENMT: Mucous membranes are moist. Posterior pharynx clear of any  exudate or lesions.   Neck: normal, supple, no masses, no thyromegaly Respiratory: clear to auscultation bilaterally, no wheezing, no crackles. Normal respiratory effort.   Cardiovascular: Rate ~110 and regular. No extremity edema. No significant JVD. Abdomen: No distension, no tenderness, no masses palpated. Bowel sounds normal.  Musculoskeletal: no clubbing / cyanosis. No joint deformity upper and lower extremities.  Skin: no significant rashes, lesions, ulcers. Warm, dry, well-perfused. Neurologic: CN 2-12 grossly intact. Sensation intact. Strength 5/5 in all 4 limbs.  Psychiatric: Alert and oriented x 3. Anxious.     Labs on Admission: I have personally reviewed following labs and imaging studies  CBC:  Recent Labs Lab 01/30/17 2240  WBC 11.9*  NEUTROABS 9.2*  HGB 17.4*  HCT 47.1  MCV 85.6  PLT 258   Basic Metabolic Panel:  Recent Labs Lab 01/30/17 2240  NA 141  K 3.7  CL  109  CO2 17*  GLUCOSE 109*  BUN 21*  CREATININE 0.95  CALCIUM 10.3   GFR: CrCl cannot be calculated (Unknown ideal weight.). Liver Function Tests:  Recent Labs Lab 01/30/17 2240  AST 32  ALT 34  ALKPHOS 71  BILITOT 1.5*  PROT 8.1  ALBUMIN 4.6   No results for input(s): LIPASE, AMYLASE in the last 168 hours. No results for input(s): AMMONIA in the last 168 hours. Coagulation Profile:  Recent Labs Lab 01/30/17 2240  INR 1.03   Cardiac Enzymes: No results for input(s): CKTOTAL, CKMB, CKMBINDEX, TROPONINI in the last 168 hours. BNP (last 3 results) No results for input(s): PROBNP in the last 8760 hours. HbA1C: No results for input(s): HGBA1C in the last 72 hours. CBG: No results for input(s): GLUCAP in the last 168 hours. Lipid Profile: No results for input(s): CHOL, HDL, LDLCALC, TRIG, CHOLHDL, LDLDIRECT in the last 72 hours. Thyroid Function Tests: No results for input(s): TSH, T4TOTAL, FREET4, T3FREE, THYROIDAB in the last 72 hours. Anemia Panel: No results for input(s):  VITAMINB12, FOLATE, FERRITIN, TIBC, IRON, RETICCTPCT in the last 72 hours. Urine analysis:    Component Value Date/Time   COLORURINE YELLOW 01/30/2017 2246   APPEARANCEUR CLEAR 01/30/2017 2246   LABSPEC 1.020 01/30/2017 2246   PHURINE 8.0 01/30/2017 2246   GLUCOSEU NEGATIVE 01/30/2017 2246   HGBUR NEGATIVE 01/30/2017 2246   BILIRUBINUR NEGATIVE 01/30/2017 2246   KETONESUR 80 (A) 01/30/2017 2246   PROTEINUR 30 (A) 01/30/2017 2246   NITRITE NEGATIVE 01/30/2017 2246   LEUKOCYTESUR NEGATIVE 01/30/2017 2246   Sepsis Labs: (procalcitonin:4,lacticidven:4) )No results found for this or any previous visit (from the past 240 hour(s)).   Radiological Exams on Admission: Dg Chest 2 View  Result Date: 01/30/2017 CLINICAL DATA:  Dyspnea for 24 hours. EXAM: CHEST  2 VIEW COMPARISON:  12/09/2015 FINDINGS: The lungs are clear. The pulmonary vasculature is normal. Heart size is normal. Hilar and mediastinal contours are unremarkable. There is no pleural effusion. IMPRESSION: No active cardiopulmonary disease. Electronically Signed   By: Ellery Plunk M.D.   On: 01/30/2017 22:35    EKG: Independently reviewed. Sinus tachycardia (rate 102), chronic LBBB.   Assessment/Plan  1. Ambien abuse with withdrawal  - Pt has long history of Ambien abuse and reports obtaining it illicitly, taking up to 10 tabs daily   - Family and a therapist has been trying to help him quit, he did not take any on the day of admission, but has developed severe anxiety with nausea and malaise  - Poison-control was contacted by ED physician and recommendation was to observe in hospital with prn benzodiazepine and supportive care - Continue supportive care, prn Ativan, consult social work   2. Chronic combined systolic/diastolic CHF  - Pt appears well-compensated on admission, likely hypovolemic given the polycythemia and increased BUN:Cr ratio - He was given 500 cc NS in ED and will be continued on a gentle IVF  hydration overnight  - Hold Aldactone, continue Entresto and bisoprolol, follow I/O's and daily wts    3. CAD  - No anginal complaints  - EKG with chronic LBBB  - Continue beta-blocker    4. Chronic back pain  - Stable  - Continue home regimen with naproxen, gabapentin, and prn Percocet  5. Insomnia - Continue Benadryl qHS    6. Depression, anxiety  - Pt very anxious on admission in setting of Ambien withdrawal and is being treated with benzodiazepine as above  - Continue Cymbalta and Seroquel  DVT prophylaxis: sq Lovenox Code Status: Full  Family Communication: Family updated at bedside Disposition Plan: Observe in SDU Consults called: None Admission status: Observation    Briscoe Deutscher, MD [facility] Pager [phone]  If 7PM-7AM, please contact night-coverage [email or web address] Password [email or web address]  01/31/2017, 12:40 AM

## 2017-01-31 NOTE — ED Notes (Signed)
Pt transferred from ER stretcher to Hospital bed.

## 2017-01-31 NOTE — Progress Notes (Signed)
Pt. not wanting CPAP this evening, does not currently use, made aware to notify if needing one to be set up.

## 2017-01-31 NOTE — Progress Notes (Addendum)
PROGRESS NOTE  Subjective: Alejandro Ramos is a 61 y.o. male with a history of CAD, chronic combined CHF and depression who presented to the ED for ambien withdrawal. He was restarted om ambien by his psychiatrist about 9 months ago and since then has had excalating daily use up to 6-10 of the 10mg  tablets daily, obtained online. His father died 2 weeks ago causing him more stress and increased ambien use. His family has known and encouraged him to get help quitting, even hiring sitters for night time due to his blackouts, but he didn't decide to stop taking ambien until 2023/02/21 after he disclosed this dependence to his therapist. On arrival he was tremulous, anxious, tachycardic with stable LBBB. UDS neg, salicylate, EtOH, APAP levels undetectable, UA and CXR unremarkable. Lactate elevated to 4.07 and a mild leukocytosis with polycythemia also noted attributed to hemoconcentration. He required several doses of benzodiazepines in the ED, so was brought in for observation.   Objective: BP (!) 136/91   Pulse (!) 118   Temp 99 F (37.2 C) (Oral)   Resp 20   Ht 5\' 10"  (1.778 m)   Wt 115.7 kg (255 lb)   SpO2 93%   BMI 36.59 kg/m   Gen: Tremulous, anxious male in no distress.  Pulm: Clear, tachypneic  CV: Regular tachycardia. No JVD, no edema GI: Soft, NT, ND, +BS  Neuro: Alert and very fidgety, oriented. No focal deficits. Skin: No track marks.  Assessment & Plan: Principal Problem:   Ambien use disorder, severe (HCC) Active Problems:   Anxiety   CAD (coronary artery disease)   OSA (obstructive sleep apnea)   Chronic combined systolic and diastolic heart failure (HCC)   Chronic insomnia   Back pain, chronic   Withdrawal from sedative drug (HCC)   Ambien use disorder, severe, dependence (HCC)  Ambien abuse and dependence with withdrawal: Obtaining online, upwards of 100 mg total daily dose, recently escalated use due to father's death. Last dose 02/21/23 ~10am.   - Poison-control was  contacted by ED physician and recommendation was to observe in hospital with prn benzodiazepine and supportive care - Will give one time librium dose and prn valium po as IV doses are wearing off with uncontrolled symptoms.  - Psychiatry consult  Chronic combined systolic/diastolic CHF: Well compensated, taking medications as directed.  - Continue home medications: entresto and bisoprolol. Holding spironolactone today, will restart 10/10.  - Tachycardic and didn't receive BB last night, so will give one time (low) dose.  - Suspect dehydration, so given 500cc bolus and IVf's overnight. Will hydrate only po now.   CAD: Stable without angina. ECG w/known LBBB - Continue home medications - No anginal complaints  - EKG with chronic LBBB  - Continue beta-blocker    Chronic back pain:  - Continue home regimen with naproxen, gabapentin, and prn percocet  Depression, anxiety, insomnia: Pt very anxious on admission in setting of Ambien withdrawal and is being treated with benzodiazepine as above  - Continue cymbalta and seroquel with benadryl qHS. - Psychiatry consulted as above.  Hopeful that symptoms can be controlled while under observation and discharged 10/10, disposition per psychiatry.  Hazeline Junker, MD [facility] Pager [phone] 01/31/2017, 11:09 AM

## 2017-02-01 DIAGNOSIS — I2510 Atherosclerotic heart disease of native coronary artery without angina pectoris: Secondary | ICD-10-CM | POA: Diagnosis not present

## 2017-02-01 DIAGNOSIS — F13239 Sedative, hypnotic or anxiolytic dependence with withdrawal, unspecified: Secondary | ICD-10-CM | POA: Diagnosis not present

## 2017-02-01 DIAGNOSIS — G4733 Obstructive sleep apnea (adult) (pediatric): Secondary | ICD-10-CM

## 2017-02-01 DIAGNOSIS — F1320 Sedative, hypnotic or anxiolytic dependence, uncomplicated: Secondary | ICD-10-CM | POA: Diagnosis not present

## 2017-02-01 DIAGNOSIS — M549 Dorsalgia, unspecified: Secondary | ICD-10-CM | POA: Diagnosis not present

## 2017-02-01 DIAGNOSIS — F4321 Adjustment disorder with depressed mood: Secondary | ICD-10-CM | POA: Diagnosis not present

## 2017-02-01 DIAGNOSIS — G8929 Other chronic pain: Secondary | ICD-10-CM

## 2017-02-01 DIAGNOSIS — I5042 Chronic combined systolic (congestive) and diastolic (congestive) heart failure: Secondary | ICD-10-CM | POA: Diagnosis not present

## 2017-02-01 DIAGNOSIS — F332 Major depressive disorder, recurrent severe without psychotic features: Secondary | ICD-10-CM | POA: Diagnosis not present

## 2017-02-01 DIAGNOSIS — F13231 Sedative, hypnotic or anxiolytic dependence with withdrawal delirium: Secondary | ICD-10-CM

## 2017-02-01 DIAGNOSIS — F5104 Psychophysiologic insomnia: Secondary | ICD-10-CM | POA: Diagnosis not present

## 2017-02-01 LAB — BASIC METABOLIC PANEL
ANION GAP: 12 (ref 5–15)
BUN: 27 mg/dL — ABNORMAL HIGH (ref 6–20)
CHLORIDE: 106 mmol/L (ref 101–111)
CO2: 21 mmol/L — AB (ref 22–32)
Calcium: 8.9 mg/dL (ref 8.9–10.3)
Creatinine, Ser: 0.91 mg/dL (ref 0.61–1.24)
GFR calc non Af Amer: >60 mL/min (ref >60)
GLUCOSE: 139 mg/dL — AB (ref 65–99)
Potassium: 3.6 mmol/L (ref 3.5–5.1)
Sodium: 139 mmol/L (ref 135–145)

## 2017-02-01 LAB — CBC
HCT: 42.4 % (ref 39.0–52.0)
Hemoglobin: 15 g/dL (ref 13.0–17.0)
MCH: 31.4 pg (ref 26.0–34.0)
MCHC: 35.4 g/dL (ref 30.0–36.0)
MCV: 88.9 fL (ref 78.0–100.0)
Platelets: 219 10*3/uL (ref 150–400)
RBC: 4.77 MIL/uL (ref 4.22–5.81)
RDW: 13.8 % (ref 11.5–15.5)
WBC: 8.4 10*3/uL (ref 4.0–10.5)

## 2017-02-01 LAB — LACTIC ACID, PLASMA: Lactic Acid, Venous: 2.1 mmol/L (ref 0.5–1.9)

## 2017-02-01 NOTE — Discharge Summary (Signed)
Physician Discharge Summary  Alejandro Ramos KPT:[id number] DOB: 01-Apr-1956 DOA: 02-18-17  PCP: Mila Palmer, MD  Admit date: 2017/02/18 Discharge date: 02/01/2017  Admitted From: Home Disposition: To [facility] rehabilitation facility in [place]   Recommendations for uutpatient follow-up:  1. Addiction treatment.  Home Health: None Equipment/Devices: None Discharge Condition: Stable CODE STATUS: Full Diet recommendation: Heart healthy  Brief/Interim Summary: Alejandro Ramos is a 61 y.o. male with a history of CAD, chronic combined CHF and depression who presented to the ED for ambien withdrawal. He was restarted om ambien by his psychiatrist about 9 months ago and since then has had excalating daily use up to 6-10 of the 10mg  tablets daily, obtained online. His father died 2 weeks ago causing him more stress and increased ambien use. His family has known and encouraged him to get help quitting, even hiring sitters for night time due to his blackouts, but he didn't decide to stop taking ambien until February 19, 2023 after he disclosed this dependence to his therapist. On arrival he was tremulous, anxious, tachycardic with stable LBBB. UDS neg, salicylate, EtOH, APAP levels undetectable, UA and CXR unremarkable. Lactate elevated to 4.07 and a mild leukocytosis with polycythemia also noted attributed to hemoconcentration. He required several doses of benzodiazepines in the ED, so was brought in for observation. His vital signs and exam have normalized and he is stable for discharge. His family has arranged for inpatient therapy to which he is discharged.    Discharge Diagnoses:  Principal Problem:   Ambien use disorder, severe (HCC) Active Problems:   Anxiety   CAD (coronary artery disease)   OSA (obstructive sleep apnea)   Chronic combined systolic and diastolic heart failure (HCC)   Chronic insomnia   Back pain, chronic   Withdrawal from sedative drug (HCC)   Ambien use disorder, severe, dependence  (HCC)  Ambien abuse and dependence with withdrawal: Obtaining meds online, upwards of 100 mg total daily dose, reportedly recently escalated use due to father's death. Last dose 02/19/2023 ~10am. With short half life, this is no longer pharmacologically active. His vital signs are normal and labs unremarkable. He is stable for discharge to address underlying addiction issues with which he's struggled "his whole life" per the patient and his family.  - Discussed at length with the patient on 10/9 and 10/10 and he is amenable to entering addiction treatment intensively.   - Can continue valium as needed for severe anxiety but defer to treatment center's medical staff going forward.   Chronic combined systolic/diastolic CHF: Well compensated, taking medications as directed.  - Continue home medications: entresto and bisoprolol. Held spironolactone today, will restart 10/10. Tachycardia resolved.   CAD: Stable without angina. ECG w/known LBBB. - Continue home medications including beta blocker.  Chronic back pain:  - Continue home regimen with naproxen, gabapentin. Would recommend coming off opioids and benzodiazepines as soon as feasible.   Depression, anxiety, insomnia: Pt very anxious on admission in setting of Ambien withdrawal and is being treated with benzodiazepine as above  - Psychiatry consulted 10/9: Recommended continuing psychotropics of cymbalta, gabapentin, seroquel and valium prn anxiety.    Discharge Instructions Discharge Instructions    Discharge patient    Complete by:  As directed    Discharge disposition:  [facility] Not Defined   Discharge patient date:  02/01/2017     Allergies as of 02/01/2017   No Known Allergies     Medication List    STOP taking these medications  predniSONE 10 MG tablet Commonly known as:  DELTASONE   zolpidem 10 MG tablet Commonly known as:  AMBIEN     TAKE these medications   acetaminophen 500 MG  tablet Commonly known as:  TYLENOL Take 1,000 mg by mouth every 6 (six) hours as needed for moderate pain or headache.   AXIRON 30 MG/ACT Soln Generic drug:  Testosterone PLACE (30 MG) ONTO THE SKIN DAILY   bisoprolol 5 MG tablet Commonly known as:  ZEBETA Take 2.5 mg by mouth every evening.   DULoxetine 60 MG capsule Commonly known as:  CYMBALTA TAKE 1 CAPSULE (60 MG TOTAL) BY MOUTH ONCE DAILY.   ENTRESTO 24-26 MG Generic drug:  sacubitril-valsartan TAKE 1 TABLET TWICE A DAY   finasteride 5 MG tablet Commonly known as:  PROSCAR Take 5 mg by mouth daily.   gabapentin 600 MG tablet Commonly known as:  NEURONTIN Take 600 mg by mouth 3 (three) times daily.   hydrOXYzine 50 MG tablet Commonly known as:  ATARAX/VISTARIL 1-2 TABLETS BY MOUTH ONE HOUR BEFORE SLEEP   MENS 50+ MULTI VITAMIN/MIN PO Take 1 tablet by mouth daily.   naproxen 500 MG tablet Commonly known as:  NAPROSYN Take 500 mg by mouth 3 (three) times daily.   omeprazole 40 MG capsule Commonly known as:  PRILOSEC TAKE ONE CAPSULE BY MOUTH TWICE A DAY 30 MINUTES BEFORE BREAKFAST AND DINNER   oxyCODONE-acetaminophen 5-325 MG tablet Commonly known as:  PERCOCET Take 1 tablet by mouth every 4 (four) hours as needed (for pain).   QUEtiapine 25 MG tablet Commonly known as:  SEROQUEL Take 25 mg by mouth 3 (three) times daily as needed (for nausea). SOMETIMES 4 TIMES A DAY   spironolactone 25 MG tablet Commonly known as:  ALDACTONE TAKE 0.5 TABLETS (12.5 MG TOTAL) BY MOUTH ONCE DAILY.   tiZANidine 2 MG tablet Commonly known as:  ZANAFLEX Take 2 mg by mouth every 8 (eight) hours as needed for muscle spasms.      Follow-up Information    Milagros Evener, MD Follow up.   Specialty:  Psychiatry Contact information: [address] P.Tyson Babinski [place] [address] [phone]        Mila Palmer, MD .   Specialty:  Family Medicine Contact information: [address] [place] [address] [phone]          No Known Allergies  Consultations:  Psychiatry, Dr. Elsie Saas  Poison control  Procedures/Studies: Dg Chest 2 View  Result Date: 01/30/2017 CLINICAL DATA:  Dyspnea for 24 hours. EXAM: CHEST  2 VIEW COMPARISON:  12/09/2015 FINDINGS: The lungs are clear. The pulmonary vasculature is normal. Heart size is normal. Hilar and mediastinal contours are unremarkable. There is no pleural effusion. IMPRESSION: No active cardiopulmonary disease. Electronically Signed   By: Ellery Plunk M.D.   On: 01/30/2017 22:35   Subjective: Amenable to intensive, inpatient addiction treatment.   Discharge Exam: Vitals:   01/31/17 2218 02/01/17 0441  BP: 129/87 131/79  Pulse: 95 80  Resp: 18 18  Temp: 97.9 F (36.6 C) 97.6 F (36.4 C)  SpO2: 93% 100%   General: Pt is alert, awake, not in acute distress Cardiovascular: RRR, S1/S2 +, no rubs, no gallops Respiratory: CTA bilaterally, no wheezing, no rhonchi Abdominal: Soft, NT, ND, bowel sounds + Extremities: No edema, no cyanosis Neuro: Mildly tremor is improved. Alert and oriented without focal deficits.  Psych: Intermittent delirium improving.  Labs: Basic Metabolic Panel:  Recent Labs Lab 01/30/17 2240 01/31/17 0559 02/01/17 0607  NA 141 141 139  K 3.7 3.4* 3.6  CL 109 110 106  CO2 17* 16* 21*  GLUCOSE 109* 113* 139*  BUN 21* 22* 27*  CREATININE 0.95 0.93 0.91  CALCIUM 10.3 9.4 8.9   Liver Function Tests:  Recent Labs Lab 01/30/17 2240  AST 32  ALT 34  ALKPHOS 71  BILITOT 1.5*  PROT 8.1  ALBUMIN 4.6   CBC:  Recent Labs Lab 01/30/17 2240 01/31/17 0559 02/01/17 0607  WBC 11.9* 11.7* 8.4  NEUTROABS 9.2*  --   --   HGB 17.4* 15.8 15.0  HCT 47.1 43.1 42.4  MCV 85.6 85.7 88.9  PLT 258 242 219   Thyroid function studies  Recent Labs  01/31/17 0600  TSH 3.889   Urinalysis    Component Value Date/Time   COLORURINE YELLOW 01/30/2017 2246    APPEARANCEUR CLEAR 01/30/2017 2246   LABSPEC 1.020 01/30/2017 2246   PHURINE 8.0 01/30/2017 2246   GLUCOSEU NEGATIVE 01/30/2017 2246   HGBUR NEGATIVE 01/30/2017 2246   BILIRUBINUR NEGATIVE 01/30/2017 2246   KETONESUR 80 (A) 01/30/2017 2246   PROTEINUR 30 (A) 01/30/2017 2246   NITRITE NEGATIVE 01/30/2017 2246   LEUKOCYTESUR NEGATIVE 01/30/2017 2246   Time coordinating discharge: Approximately 40 minutes  Hazeline Junker, MD  [facility] 02/01/2017, 11:01 AM Pager [phone]

## 2017-02-01 NOTE — Progress Notes (Signed)
CRITICAL VALUE ALERT  Critical Value:  Lactic Acid 2.1  Date & Time Notied:  10/10 0715  Provider Notified: Jarvis Newcomer  Orders Received/Actions taken:  Justin Mend, RN

## 2017-02-01 NOTE — Care Management Note (Signed)
Case Management Note  Patient Details  Name: Alejandro Ramos MRN: [id number] Date of Birth: [birth year]  Subjective/Objective:                  61 y.o. male with medical history significant for coronary artery disease, chronic combined systolic/diastolic CHF, and chronic back pain, no percent emergency department for evaluation of a drug problem. Patient has been obtaining Ambien illicitly and using it daily and excessive doses, sometimes up to 10 tablets a day. His family has been trying to help him with his apparent addiction, and he has not taken any doses since yesterday. Today, he developed tremors and intermittent confusion. He has also been experiencing nausea with vomiting. Denies any recent alcohol use. Denies fever or chills. Denies chest pain or palpitations, and denies leg swelling or tenderness.   ED Course: Upon arrival to the ED, patient is found to be afebrile, saturating well on room air, slightly tachycardic, and otherwise stable. EKG features a sinus tachycardia with rate 102 and chronic left bundle branch block. Chest x-ray is negative for acute cardiopulmonary disease. UDS was negative and salicylate, acetaminophen, and ethanol levels were undetectable. Urinalysis is unremarkable. CBC features a slight leukocytosis and a mild polycythemia. Chemistry panel is notable for total bilirubin 1.5 and bicarbonate of 17. Lactic acid was elevated 4.07. Patient was treated with a dose of Ativan and 500 mL normal saline in the ED. He remained agitated, slightly tachycardic, and poison control was contacted by the ED physician.  Action/Plan: Date:  February 01, 2017 Chart reviewed for concurrent status and case management needs.  Will continue to follow patient progress.  Discharge Planning: following for needs  Expected discharge date: [id number]  Marcelle Smiling, BSN, [place], [place]   [phone]  Expected Discharge Date:                  Expected Discharge Plan:  Home/Self Care  In-House  Referral:  Clinical Social Work  Discharge planning Services  CM Consult  Post Acute Care Choice:    Choice offered to:     DME Arranged:    DME Agency:     HH Arranged:    HH Agency:     Status of Service:  In process, will continue to follow  If discussed at Long Length of Stay Meetings, dates discussed:    Additional Comments:  Golda Acre, RN 02/01/2017, 8:41 AM

## 2017-02-01 NOTE — Progress Notes (Signed)
CSW met with patient, sister and brother in law at bedside. Patient provided CSW with copy of signed consent form. CSW faxed information to [facility] [address] CSW received confirmation number.

## 2017-02-01 NOTE — Consult Note (Signed)
[facility] Psychiatry Consult   Reason for Consult:  Ambien abuse Referring Physician:  Dr. Bonner Puna Patient Identification: Alejandro Ramos MRN:  [id number] Principal Diagnosis: Ambien use disorder, severe [facility]) Diagnosis:   Patient Active Problem List   Diagnosis Date Noted  . Ambien use disorder, severe ([place]) [F13.20] 01/31/2017  . Withdrawal from sedative drug ([place]) [F13.239] 01/31/2017  . Ambien use disorder, severe, dependence ([place]) [F13.20] 01/31/2017  . Chronic insomnia [F51.04] 01/25/2016  . Chronic combined systolic and diastolic heart failure ([place]) [I50.42] 01/06/2016  . Cardiomyopathy ([place]) [I42.9] 01/06/2016  . LBBB (left bundle branch block) [I44.7] 01/06/2016  . Pre-diabetes [R73.03] 01/06/2016  . CAD (coronary artery disease) [I25.10] 12/10/2015  . Mixed hyperlipidemia [E78.2] 12/10/2015  . Severe obesity (BMI 35.0-35.9 with comorbidity) ([place]) [E66.01, Z68.35] 12/10/2015  . OSA (obstructive sleep apnea) [G47.33] 12/10/2015  . Syncope [R55] 12/10/2015  . Anxiety [F41.9] 03/07/2012  . Alcohol dependence ([place]) [F10.20] 02/28/2012  . Back pain, chronic [M54.9, G89.29] 12/03/2011    Total Time spent with patient: 1 hour  Subjective:   IMRI LOR is a 61 y.o. male patient admitted with ambien withdrawal.  HPI:  Alejandro Ramos is a 61 y.o. male with a history of CAD, chronic CHF and depression admitted to [facility] with severe symptoms of ambien withdrawal. He was restarted on ambien by his psychiatrist about 9 months ago and since then has had escalating daily use up to 6-10 of the 33m tablets daily, obtained online. His father died 2 weeks ago causing him more stress and increased ambien use. Patient appeared awake, anxious, restless, tremulous, increased heart rate, nausea, sweating, tearful and back pain. Reportedly he has blackouts and his therapist recommended in patient admission for ongoing excessive ambien abuse and dependence and withdrawal symptoms. Patient  sister MCPOA and her husband has been supportive and reportedly placed a IVC petition and working on long term - 30 day residential rehab treatment after medically stable. Patient has been seeing Dr. PLadoris Geneat [facility]iat[facility]. Patient denied current suicide or homicide ideation, intention or plans. He has no evidence of psychosis.  Past Psychiatric History: Depression, and anxiety. [place]    02/01/2017.  Interval history: Patient is seen today for psych consultation follow up and case discussed with patient MCPOA, and her husband who are at bed side and working private detox and rahab in out of state. Patient is little more cooperative today, continue to be confused, nausea, slow mentation, decreased focus and attention, on and off tangential on topic. 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 He has limited contact with his children who are still in educational institute. Patient feels some what guilty about his work of iOrthoptist He has very hard time to sleep and stated he lied about amount of ambien taken prior to this admission to get into treatment. He is still wanted to get treatment and agree with his sister and her husband. He does not eat regular meals and drink chocolate ensure.   Risk  to Self: Is patient at risk for suicide?: No Risk to Others:   Prior Inpatient Therapy:   Prior Outpatient Therapy:    Past Medical History:  Past Medical History:  Diagnosis Date  . Back pain, chronic 12/03/2011  . CAD (coronary  artery disease)   . CHF (congestive heart failure) ([place])   . Chronic prostatitis   . 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 H/O: drug dependency ([place])   . IBS (irritable bowel syndrome)   . Kidney stone   . LBBB (left bundle branch block)   . OSA (obstructive sleep apnea)   . Sleep apnea 12/03/2011   Has a C-PAP machine, but does not use it.  . 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 CARDIAC CATHETERIZATION N/A 01/12/2016   Procedure: Right/Left Heart Cath and Coronary Angiography;  Surgeon: Sanda Klein, MD; RCA 15%, LM 25-30%, LVEDP 12 mm Hg, PWCP mean 14 mm Hg, PA 35/20, mean 26 mm Hg, CO 4.9 L/min, CI 2 L/min (sq)   Family History:  Family History  Problem Relation Age of Onset  . Paranoid behavior Father   . Congestive Heart Failure Father   . Arrhythmia Mother        has a PPM  . Heart attack Maternal Grandmother        21s  . Arrhythmia Maternal Grandfather        72s  . Heart attack Paternal Grandmother 93  . Heart attack Paternal Grandfather 49   Family Psychiatric  History: Unknown Social History:  History  Alcohol Use No     History  Drug Use No    Social History   Social History  . Marital status: Divorced    Spouse name: N/A  . Number of children: N/A  . Years of education: N/A   Social History Main Topics  . Smoking status: Never Smoker  . Smokeless tobacco: Never Used  . Alcohol use No  . Drug use: No  . Sexual activity: Not Currently   Other Topics Concern  . None   Social History Narrative  . None   Additional Social History:    Allergies:  No Known Allergies  Labs:  Results for orders placed or performed during the hospital encounter of 01/30/17 (from the past 48 hour(s))  Comprehensive metabolic panel     Status: Abnormal   Collection Time: 01/30/17 10:40 PM  Result Value Ref Range   Sodium 141 135 - 145 mmol/L   Potassium 3.7 3.5 - 5.1 mmol/L    Chloride 109 101 - 111 mmol/L   CO2 17 (L) 22 - 32 mmol/L   Glucose, Bld 109 (H) 65 - 99 mg/dL   BUN 21 (H) 6 - 20 mg/dL   Creatinine, Ser 0.95 0.61 - 1.24 mg/dL   Calcium 10.3 8.9 - 10.3 mg/dL   Total Protein 8.1 6.5 - 8.1 g/dL   Albumin 4.6 3.5 - 5.0 g/dL   AST 32 15 - 41 U/L   ALT 34 17 - 63 U/L   Alkaline Phosphatase 71 38 - 126 U/L   Total Bilirubin 1.5 (H) 0.3 - 1.2 mg/dL   GFR calc non Af Amer >60 >60 mL/min   GFR calc Af Amer >60 >60 mL/min    Comment: (NOTE) The eGFR has been calculated using the CKD EPI equation. This calculation has not been validated in all clinical situations. eGFR's persistently <60 mL/min signify possible Chronic Kidney  Disease.    Anion gap 15 5 - 15  Ethanol     Status: None   Collection Time: 01/30/17 10:40 PM  Result Value Ref Range   Alcohol, Ethyl (B) <10 <10 mg/dL    Comment:        LOWEST DETECTABLE LIMIT FOR SERUM ALCOHOL IS 10 mg/dL FOR MEDICAL PURPOSES ONLY Please note change in reference range.   Salicylate level     Status: None   Collection Time: 01/30/17 10:40 PM  Result Value Ref Range   Salicylate Lvl <3.2 2.8 - 30.0 mg/dL  Acetaminophen level     Status: Abnormal   Collection Time: 01/30/17 10:40 PM  Result Value Ref Range   Acetaminophen (Tylenol), Serum <10 (L) 10 - 30 ug/mL    Comment:        THERAPEUTIC CONCENTRATIONS VARY SIGNIFICANTLY. A RANGE OF 10-30 ug/mL MAY BE AN EFFECTIVE CONCENTRATION FOR MANY PATIENTS. HOWEVER, SOME ARE BEST TREATED AT CONCENTRATIONS OUTSIDE THIS RANGE. ACETAMINOPHEN CONCENTRATIONS >150 ug/mL AT 4 HOURS AFTER INGESTION AND >50 ug/mL AT 12 HOURS AFTER INGESTION ARE OFTEN ASSOCIATED WITH TOXIC REACTIONS.   CBC with Differential     Status: Abnormal   Collection Time: 01/30/17 10:40 PM  Result Value Ref Range   WBC 11.9 (H) 4.0 - 10.5 K/uL   RBC 5.50 4.22 - 5.81 MIL/uL   Hemoglobin 17.4 (H) 13.0 - 17.0 g/dL   HCT 47.1 39.0 - 52.0 %   MCV 85.6 78.0 - 100.0 fL   MCH 31.6 26.0 -  34.0 pg   MCHC 36.9 (H) 30.0 - 36.0 g/dL   RDW 12.7 11.5 - 15.5 %   Platelets 258 150 - 400 K/uL   Neutrophils Relative % 77 %   Lymphocytes Relative 13 %   Monocytes Relative 10 %   Eosinophils Relative 0 %   Basophils Relative 0 %   Neutro Abs 9.2 (H) 1.7 - 7.7 K/uL   Lymphs Abs 1.5 0.7 - 4.0 K/uL   Monocytes Absolute 1.2 (H) 0.1 - 1.0 K/uL   Eosinophils Absolute 0.0 0.0 - 0.7 K/uL   Basophils Absolute 0.0 0.0 - 0.1 K/uL   Smear Review MORPHOLOGY UNREMARKABLE   Protime-INR     Status: None   Collection Time: 01/30/17 10:40 PM  Result Value Ref Range   Prothrombin Time 13.5 11.4 - 15.2 seconds   INR 1.03   Urinalysis, Routine w reflex microscopic     Status: Abnormal   Collection Time: 01/30/17 10:46 PM  Result Value Ref Range   Color, Urine YELLOW YELLOW   APPearance CLEAR CLEAR   Specific Gravity, Urine 1.020 1.005 - 1.030   pH 8.0 5.0 - 8.0   Glucose, UA NEGATIVE NEGATIVE mg/dL   Hgb urine dipstick NEGATIVE NEGATIVE   Bilirubin Urine NEGATIVE NEGATIVE   Ketones, ur 80 (A) NEGATIVE mg/dL   Protein, ur 30 (A) NEGATIVE mg/dL   Nitrite NEGATIVE NEGATIVE   Leukocytes, UA NEGATIVE NEGATIVE   RBC / HPF 0-5 0 - 5 RBC/hpf   WBC, UA 0-5 0 - 5 WBC/hpf   Bacteria, UA NONE SEEN NONE SEEN   Squamous Epithelial / LPF NONE SEEN NONE SEEN   Mucus PRESENT    Hyaline Casts, UA PRESENT   Urine rapid drug screen (hosp performed)     Status: None   Collection Time: 01/30/17 10:46 PM  Result Value Ref Range   Opiates NONE DETECTED NONE DETECTED   Cocaine NONE DETECTED NONE DETECTED   Benzodiazepines NONE  DETECTED NONE DETECTED   Amphetamines NONE DETECTED NONE DETECTED   Tetrahydrocannabinol NONE DETECTED NONE DETECTED   Barbiturates NONE DETECTED NONE DETECTED    Comment:        DRUG SCREEN FOR MEDICAL PURPOSES ONLY.  IF CONFIRMATION IS NEEDED FOR ANY PURPOSE, NOTIFY LAB WITHIN 5 DAYS.        LOWEST DETECTABLE LIMITS FOR URINE DRUG SCREEN Drug Class       Cutoff  (ng/mL) Amphetamine      1000 Barbiturate      200 Benzodiazepine   242 Tricyclics       353 Opiates          300 Cocaine          300 THC              50   I-stat troponin, ED     Status: None   Collection Time: 01/30/17 10:53 PM  Result Value Ref Range   Troponin i, poc 0.00 0.00 - 0.08 ng/mL   Comment 3            Comment: Due to the release kinetics of cTnI, a negative result within the first hours of the onset of symptoms does not rule out myocardial infarction with certainty. If myocardial infarction is still suspected, repeat the test at appropriate intervals.   I-Stat CG4 Lactic Acid, ED     Status: Abnormal   Collection Time: 01/30/17 10:55 PM  Result Value Ref Range   Lactic Acid, Venous 4.07 (HH) 0.5 - 1.9 mmol/L   Comment NOTIFIED PHYSICIAN   HIV antibody (Routine Testing)     Status: None   Collection Time: 01/31/17  5:59 AM  Result Value Ref Range   HIV Screen 4th Generation wRfx Non Reactive Non Reactive    Comment: (NOTE) Performed At: [facility] [place], [place] [id number] Lindon Romp MD GQ:[id number]   CBC     Status: Abnormal   Collection Time: 01/31/17  5:59 AM  Result Value Ref Range   WBC 11.7 (H) 4.0 - 10.5 K/uL   RBC 5.03 4.22 - 5.81 MIL/uL   Hemoglobin 15.8 13.0 - 17.0 g/dL   HCT 43.1 39.0 - 52.0 %   MCV 85.7 78.0 - 100.0 fL   MCH 31.4 26.0 - 34.0 pg   MCHC 36.7 (H) 30.0 - 36.0 g/dL   RDW 13.1 11.5 - 15.5 %   Platelets 242 150 - 400 K/uL  Basic metabolic panel     Status: Abnormal   Collection Time: 01/31/17  5:59 AM  Result Value Ref Range   Sodium 141 135 - 145 mmol/L   Potassium 3.4 (L) 3.5 - 5.1 mmol/L   Chloride 110 101 - 111 mmol/L   CO2 16 (L) 22 - 32 mmol/L   Glucose, Bld 113 (H) 65 - 99 mg/dL   BUN 22 (H) 6 - 20 mg/dL   Creatinine, Ser 0.93 0.61 - 1.24 mg/dL   Calcium 9.4 8.9 - 10.3 mg/dL   GFR calc non Af Amer >60 >60 mL/min   GFR calc Af Amer >60 >60 mL/min    Comment: (NOTE) The eGFR has  been calculated using the CKD EPI equation. This calculation has not been validated in all clinical situations. eGFR's persistently <60 mL/min signify possible Chronic Kidney Disease.    Anion gap 15 5 - 15  Lactic acid, plasma     Status: Abnormal   Collection Time: 01/31/17  5:59 AM  Result  Value Ref Range   Lactic Acid, Venous 2.6 (HH) 0.5 - 1.9 mmol/L    Comment: CRITICAL RESULT CALLED TO, READ BACK BY AND VERIFIED WITH: E.COGGIN RN [phone] [id number] A.QUIZON   TSH     Status: None   Collection Time: 01/31/17  6:00 AM  Result Value Ref Range   TSH 3.889 0.350 - 4.500 uIU/mL    Comment: Performed by a 3rd Generation assay with a functional sensitivity of <=0.01 uIU/mL.  Lactic acid, plasma     Status: Abnormal   Collection Time: 02/01/17  6:07 AM  Result Value Ref Range   Lactic Acid, Venous 2.1 (HH) 0.5 - 1.9 mmol/L    Comment: CRITICAL RESULT CALLED TO, READ BACK BY AND VERIFIED WITHGlynn Octave CHARGE RN AT [phone] 02/01/17 MULLINS,T   CBC     Status: None   Collection Time: 02/01/17  6:07 AM  Result Value Ref Range   WBC 8.4 4.0 - 10.5 K/uL   RBC 4.77 4.22 - 5.81 MIL/uL   Hemoglobin 15.0 13.0 - 17.0 g/dL   HCT 42.4 39.0 - 52.0 %   MCV 88.9 78.0 - 100.0 fL   MCH 31.4 26.0 - 34.0 pg   MCHC 35.4 30.0 - 36.0 g/dL   RDW 13.8 11.5 - 15.5 %   Platelets 219 150 - 400 K/uL  Basic metabolic panel     Status: Abnormal   Collection Time: 02/01/17  6:07 AM  Result Value Ref Range   Sodium 139 135 - 145 mmol/L   Potassium 3.6 3.5 - 5.1 mmol/L   Chloride 106 101 - 111 mmol/L   CO2 21 (L) 22 - 32 mmol/L   Glucose, Bld 139 (H) 65 - 99 mg/dL   BUN 27 (H) 6 - 20 mg/dL   Creatinine, Ser 0.91 0.61 - 1.24 mg/dL   Calcium 8.9 8.9 - 10.3 mg/dL   GFR calc non Af Amer >60 >60 mL/min   GFR calc Af Amer >60 >60 mL/min    Comment: (NOTE) The eGFR has been calculated using the CKD EPI equation. This calculation has not been validated in all clinical situations. eGFR's persistently <60 mL/min  signify possible Chronic Kidney Disease.    Anion gap 12 5 - 15    Current Facility-Administered Medications  Medication Dose Route Frequency Provider Last Rate Last Dose  . acetaminophen (TYLENOL) tablet 650 mg  650 mg Oral Q6H PRN Opyd, Ilene Qua, MD   650 mg at 01/31/17 1651   Or  . acetaminophen (TYLENOL) suppository 650 mg  650 mg Rectal Q6H PRN Opyd, Ilene Qua, MD      . bisoprolol (ZEBETA) tablet 2.5 mg  2.5 mg Oral QPM Opyd, Ilene Qua, MD   2.5 mg at 01/31/17 1843  . diazepam (VALIUM) tablet 5-10 mg  5-10 mg Oral Q6H PRN Patrecia Pour, MD   5 mg at 02/01/17 0644  . DULoxetine (CYMBALTA) DR capsule 60 mg  60 mg Oral Daily Opyd, Ilene Qua, MD   60 mg at 02/01/17 1104  . enoxaparin (LOVENOX) injection 40 mg  40 mg Subcutaneous Q24H Opyd, Ilene Qua, MD   40 mg at 02/01/17 1104  . finasteride (PROSCAR) tablet 5 mg  5 mg Oral Daily Opyd, Ilene Qua, MD   5 mg at 02/01/17 1104  . gabapentin (NEURONTIN) capsule 600 mg  600 mg Oral TID Vianne Bulls, MD   600 mg at 02/01/17 1104  . hydrOXYzine (ATARAX/VISTARIL) tablet 50 mg  50 mg Oral QHS  Vianne Bulls, MD   50 mg at 01/31/17 2128  . naproxen (NAPROSYN) tablet 500 mg  500 mg Oral Q8H Opyd, Ilene Qua, MD   500 mg at 02/01/17 0541  . ondansetron (ZOFRAN) tablet 4 mg  4 mg Oral Q6H PRN Opyd, Ilene Qua, MD       Or  . ondansetron (ZOFRAN) injection 4 mg  4 mg Intravenous Q6H PRN Opyd, Ilene Qua, MD   4 mg at 02/01/17 0804  . oxyCODONE-acetaminophen (PERCOCET/ROXICET) 5-325 MG per tablet 1 tablet  1 tablet Oral Q4H PRN Opyd, Ilene Qua, MD   1 tablet at 02/01/17 0804  . pantoprazole (PROTONIX) EC tablet 40 mg  40 mg Oral Daily Opyd, Ilene Qua, MD   40 mg at 02/01/17 1104  . QUEtiapine (SEROQUEL) tablet 50 mg  50 mg Oral QHS Opyd, Ilene Qua, MD   50 mg at 01/31/17 2128  . sacubitril-valsartan (ENTRESTO) 24-26 mg per tablet  1 tablet Oral BID Opyd, Ilene Qua, MD   1 tablet at 02/01/17 1104  . senna-docusate (Senokot-S) tablet 1 tablet  1 tablet  Oral QHS PRN Opyd, Ilene Qua, MD      . sodium chloride flush (NS) 0.9 % injection 3 mL  3 mL Intravenous Q12H Opyd, Ilene Qua, MD   3 mL at 02/01/17 1000  . tiZANidine (ZANAFLEX) tablet 2 mg  2 mg Oral Q8H PRN Opyd, Ilene Qua, MD   2 mg at 02/01/17 0644    Musculoskeletal: Strength & Muscle Tone: decreased Gait & Station: unable to stand Patient leans: N/A  Psychiatric Specialty Exam: Physical Exam as per history and physical  ROS depression, anxiety, insomnia, back pain, nausea, shaking, tremors, and sweating. No Fever-chills, No Headache, No changes with Vision or hearing, reports vertigo No problems swallowing food or Liquids, No Chest pain, Cough or Shortness of Breath, No Abdominal pain, No Nausea or Vommitting, Bowel movements are regular, No Blood in stool or Urine, No dysuria, No new skin rashes or bruises, No new joints pains-aches,  No new weakness, tingling, numbness in any extremity, No recent weight gain or loss, No polyuria, polydypsia or polyphagia,  A full 10 point Review of Systems was done, except as stated above, all other Review of Systems were negative.  Blood pressure 131/79, pulse 80, temperature 97.6 F (36.4 C), temperature source Oral, resp. rate 18, height 5' 11"  (1.803 m), weight 112.7 kg (248 lb 8 oz), SpO2 100 %.Body mass index is 34.66 kg/m.  General Appearance: Disheveled and Guarded, morbid obese  Eye Contact:  Good  Speech:  Clear and Coherent and Slow  Volume:  Decreased  Mood:  Anxious and Depressed  Affect:  Constricted and Depressed  Thought Process:  Coherent and Goal Directed  Orientation:  Full (Time, Place, and Person)  Thought Content:  Rumination and Tangential  Suicidal Thoughts:  No  Homicidal Thoughts:  No  Memory:  Immediate;   Good Recent;   Fair Remote;   Fair  Judgement:  Impaired  Insight:  Fair  Psychomotor Activity:  Restlessness  Concentration:  Concentration: Fair and Attention Span: Fair  Recall:  Good  Fund  of Knowledge:  Good  Language:  Good  Akathisia:  Negative  Handed:  Right  AIMS (if indicated):     Assets:  Communication Skills Desire for Improvement Financial Resources/Insurance Housing Leisure Time Resilience Social Support Transportation  ADL's:  Intact  Cognition:  WNL  Sleep:        Treatment Plan  Summary: Daily contact with patient to assess and evaluate symptoms and progress in treatment and Medication management   Ambien use disorder, severe Major depressive disorder, recurrent  Recommendation: Monitor for withdrawal symptoms Continue Cymbalta for depression, gabapentin for anxiety and Seroquel for agitation Continue Valium for anxiety and insomnia due to withdrawal of ambien Refer to psych LCSW for placement needs  Family is supportive and working on for the rehab treatment placement reportedly out of state As per his sister who is working with [employer] for air medical transfer from [place] this afternoon to [employer] in [place] and than work with 60 day program at [address] for rehabilitation. Case discussed with patient sister and her husband at bed side with patient consent  Appreciate psychiatric consultation and follow up as clinically required Please contact [phone] or [phone] if needs further assistance  Disposition: No evidence of imminent risk to self or others at present.   Patient does not meet criteria for psychiatric inpatient admission. Supportive therapy provided about ongoing stressors. Patient and his [place] chosen to get further detox and rehab out of state.   Ambrose Finland, MD 02/01/2017 12:18 PM

## 2017-02-19 ENCOUNTER — Other Ambulatory Visit: Payer: Self-pay | Admitting: Cardiovascular Disease

## 2017-03-27 ENCOUNTER — Ambulatory Visit: Payer: BLUE CROSS/BLUE SHIELD | Admitting: Cardiovascular Disease

## 2017-04-21 DIAGNOSIS — F39 Unspecified mood [affective] disorder: Secondary | ICD-10-CM | POA: Diagnosis not present

## 2017-05-08 DIAGNOSIS — G4733 Obstructive sleep apnea (adult) (pediatric): Secondary | ICD-10-CM | POA: Diagnosis not present

## 2017-05-08 DIAGNOSIS — R0609 Other forms of dyspnea: Secondary | ICD-10-CM | POA: Diagnosis not present

## 2017-05-08 DIAGNOSIS — I5022 Chronic systolic (congestive) heart failure: Secondary | ICD-10-CM | POA: Diagnosis not present

## 2017-05-09 DIAGNOSIS — Z87442 Personal history of urinary calculi: Secondary | ICD-10-CM | POA: Diagnosis not present

## 2017-05-09 DIAGNOSIS — Z87898 Personal history of other specified conditions: Secondary | ICD-10-CM | POA: Diagnosis not present

## 2017-05-09 DIAGNOSIS — R972 Elevated prostate specific antigen [PSA]: Secondary | ICD-10-CM | POA: Diagnosis not present

## 2017-05-09 DIAGNOSIS — E291 Testicular hypofunction: Secondary | ICD-10-CM | POA: Diagnosis not present

## 2017-05-16 DIAGNOSIS — F5104 Psychophysiologic insomnia: Secondary | ICD-10-CM | POA: Diagnosis not present

## 2017-05-21 DIAGNOSIS — J069 Acute upper respiratory infection, unspecified: Secondary | ICD-10-CM | POA: Diagnosis not present

## 2017-05-21 DIAGNOSIS — J029 Acute pharyngitis, unspecified: Secondary | ICD-10-CM | POA: Diagnosis not present

## 2017-05-24 DIAGNOSIS — F3342 Major depressive disorder, recurrent, in full remission: Secondary | ICD-10-CM | POA: Diagnosis not present

## 2017-05-29 DIAGNOSIS — F5101 Primary insomnia: Secondary | ICD-10-CM | POA: Diagnosis not present

## 2017-05-29 DIAGNOSIS — F4312 Post-traumatic stress disorder, chronic: Secondary | ICD-10-CM | POA: Diagnosis not present

## 2017-06-07 DIAGNOSIS — F4312 Post-traumatic stress disorder, chronic: Secondary | ICD-10-CM | POA: Diagnosis not present

## 2017-06-07 DIAGNOSIS — F5101 Primary insomnia: Secondary | ICD-10-CM | POA: Diagnosis not present

## 2017-06-13 DIAGNOSIS — F4312 Post-traumatic stress disorder, chronic: Secondary | ICD-10-CM | POA: Diagnosis not present

## 2017-06-13 DIAGNOSIS — F3342 Major depressive disorder, recurrent, in full remission: Secondary | ICD-10-CM | POA: Diagnosis not present

## 2017-06-13 DIAGNOSIS — F5101 Primary insomnia: Secondary | ICD-10-CM | POA: Diagnosis not present

## 2017-06-19 ENCOUNTER — Ambulatory Visit: Payer: BLUE CROSS/BLUE SHIELD | Admitting: Cardiovascular Disease

## 2017-06-19 ENCOUNTER — Encounter: Payer: Self-pay | Admitting: Cardiovascular Disease

## 2017-06-19 VITALS — BP 116/82 | HR 73 | Ht 71.0 in | Wt 259.4 lb

## 2017-06-19 DIAGNOSIS — E7800 Pure hypercholesterolemia, unspecified: Secondary | ICD-10-CM

## 2017-06-19 DIAGNOSIS — G4733 Obstructive sleep apnea (adult) (pediatric): Secondary | ICD-10-CM | POA: Diagnosis not present

## 2017-06-19 DIAGNOSIS — I5042 Chronic combined systolic (congestive) and diastolic (congestive) heart failure: Secondary | ICD-10-CM | POA: Diagnosis not present

## 2017-06-19 DIAGNOSIS — Z6836 Body mass index (BMI) 36.0-36.9, adult: Secondary | ICD-10-CM

## 2017-06-19 DIAGNOSIS — Z87898 Personal history of other specified conditions: Secondary | ICD-10-CM

## 2017-06-19 DIAGNOSIS — I447 Left bundle-branch block, unspecified: Secondary | ICD-10-CM | POA: Diagnosis not present

## 2017-06-19 DIAGNOSIS — I2510 Atherosclerotic heart disease of native coronary artery without angina pectoris: Secondary | ICD-10-CM | POA: Diagnosis not present

## 2017-06-19 MED ORDER — BISOPROLOL FUMARATE 10 MG PO TABS: 10.0000 mg | ORAL_TABLET | Freq: Every day | ORAL | 11 refills | Status: DC

## 2017-06-19 NOTE — Patient Instructions (Signed)
Dr Croitoru recommends that you schedule a follow-up appointment in 6 months. You will receive a reminder letter in the mail two months in advance. If you don't receive a letter, please call our office to schedule the follow-up appointment.  If you need a refill on your cardiac medications before your next appointment, please call your pharmacy. 

## 2017-06-19 NOTE — Progress Notes (Signed)
Cardiology consultation Note    Date:  06/19/2017   ID:  ANDRICK RUST, DOB 1955-05-04, MRN [id number]  PCP:  Mila Palmer, MD  Cardiologist:   Thurmon Fair, MD  Reason for consultation: Syncope, multiple coronary risk factors No chief complaint on file.   History of Present Illness:  Alejandro Ramos is a 62 y.o. male who presents for Idiopathic dilated cardiomyopathy and congestive heart failure.  He has done well from a cardiac perspective.  He has not required escalation in diuretic dosage or hospitalization for heart failure, palpitations and syncope, orthopnea, PND, claudication or focal neurological events.  After losing about 25 pounds from 2017-2018, he has gained back about 10 pounds.  He just saw Dr. Truett Perna at [facility] in January. He is now taking bisoprolol 10 mg daily, without side effects.    Sleep study has been ordered.  Was seen in the sleep medicine clinic at [facility] by Dr. Raul Del, but it looks like that appointment did not go too well. He felt that she was too inexperienced to help him.  Hospitalized October 2018 for Ambien withdrawal, after using doses as high as 10 times the maximum recommended dose. Now seeing Dr. Donell Beers and he is using Halcion under his supervision.  Last assessment of LVEF was by cardiac MRI in December 2017: 46%.  In 2017, Left ventricular systolic function was estimated at 25-30% by echo and LV angiography, 36% by the nuclear test. No reversible ischemia was seen on the nuclear study (small fixed defect which was probably LBBB related artifact). No significant valvular abnormalities are seen. He underwent right and left heart catheterization on 01/12/2016. It showed minimal scattered coronary atherosclerosis and confirmed severely depressed left ventricular systolic function. He was hemodynamically compensated at the time with a mean PA wedge pressure of 14 mmHg and a cardiac index of 2.0 L/min/meter sq.  Last assessment of LVEF was by cardiac  MRI in December 2017 which showed an improvement from 25-30% by echo and LV angiogram, up to 46% by MRI. The cardiac index was calculated at 2.2 L/minute/meter squared. Gadolinium enhancement showed no evidence of infiltrative abnormalities or any scar.  Past Medical History:  Diagnosis Date  . Back pain, chronic 12/03/2011  . CAD (coronary artery disease)   . CHF (congestive heart failure) (HCC)   . Chronic prostatitis   . 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 H/O: drug dependency (HCC)   . IBS (irritable bowel syndrome)   . Kidney stone   . LBBB (left bundle branch block)   . OSA (obstructive sleep apnea)   . Sleep apnea 12/03/2011   Has a C-PAP machine, but does not use it.  . 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 CARDIAC CATHETERIZATION N/A 01/12/2016   Procedure: Right/Left Heart Cath and Coronary Angiography;  Surgeon: Thurmon Fair, MD; RCA 15%, LM 25-30%, LVEDP 12 mm Hg, PWCP mean 14 mm Hg, PA 35/20, mean 26 mm Hg, CO 4.9 L/min, CI 2 L/min (sq)    Current Medications: Outpatient Medications Prior to Visit  Medication Sig Dispense Refill  . acetaminophen (TYLENOL) 500 MG tablet Take 1,000 mg by mouth every 6 (six) hours as needed for moderate pain or headache.    . DULoxetine (CYMBALTA) 60 MG capsule TAKE 1 CAPSULE (60 MG TOTAL) BY MOUTH ONCE DAILY.  11  . ENTRESTO 24-26 MG TAKE 1 TABLET TWICE A DAY 180 tablet 1  . finasteride (  PROSCAR) 5 MG tablet Take 5 mg by mouth daily.   4  . gabapentin (NEURONTIN) 600 MG tablet Take 600 mg by mouth 3 (three) times daily.     . hydrOXYzine (ATARAX/VISTARIL) 50 MG tablet 1-2 TABLETS BY MOUTH ONE HOUR BEFORE SLEEP  11  . Multiple Vitamins-Minerals (MENS 50+ MULTI VITAMIN/MIN PO) Take 1 tablet by mouth daily.    Marland Kitchen omeprazole (PRILOSEC) 40 MG capsule TAKE ONE CAPSULE BY MOUTH TWICE A DAY 30 MINUTES BEFORE BREAKFAST AND DINNER  3  . spironolactone (ALDACTONE)  25 MG tablet TAKE 0.5 TABLETS (12.5 MG TOTAL) BY MOUTH ONCE DAILY. 15 tablet 10  . Testosterone (AXIRON) 30 MG/ACT SOLN PLACE (30 MG) ONTO THE SKIN DAILY    . tiZANidine (ZANAFLEX) 2 MG tablet Take 2 mg by mouth every 8 (eight) hours as needed for muscle spasms.   0  . bisoprolol (ZEBETA) 10 MG tablet Take 10 mg by mouth at bedtime.    . bisoprolol (ZEBETA) 5 MG tablet Take 2.5 mg by mouth every evening.     . bisoprolol (ZEBETA) 5 MG tablet TAKE 0.5 TABLETS (2.5 MG TOTAL) BY MOUTH EVERY EVENING. 15 tablet 6  . naproxen (NAPROSYN) 500 MG tablet Take 500 mg by mouth 3 (three) times daily.  0  . oxyCODONE-acetaminophen (PERCOCET) 5-325 MG tablet Take 1 tablet by mouth every 4 (four) hours as needed (for pain). (Patient not taking: Reported on 01/31/2017) 20 tablet 0  . QUEtiapine (SEROQUEL) 25 MG tablet Take 25 mg by mouth 3 (three) times daily as needed (for nausea). SOMETIMES 4 TIMES A DAY     No facility-administered medications prior to visit.      Allergies:   Patient has no known allergies.   Social History   Socioeconomic History  . Marital status: Divorced    Spouse name: None  . Number of children: None  . Years of education: None  . Highest education level: None  Social Needs  . Financial resource strain: None  . Food insecurity - worry: None  . Food insecurity - inability: None  . Transportation needs - medical: None  . Transportation needs - non-medical: None  Occupational History  . None  Tobacco Use  . Smoking status: Never Smoker  . Smokeless tobacco: Never Used  Substance and Sexual Activity  . Alcohol use: No  . Drug use: No  . Sexual activity: Not Currently  Other Topics Concern  . None  Social History Narrative  . None     Family History:  The patient's family history includes Arrhythmia in his maternal grandfather and mother; Congestive Heart Failure in his father; Heart attack in his maternal grandmother; Heart attack (age of onset: 34) in his paternal  grandfather; Heart attack (age of onset: 58) in his paternal grandmother; Paranoid behavior in his father.   ROS:   Please see the history of present illness.    ROS All other systems reviewed and are negative.   PHYSICAL EXAM:   VS:  BP 116/82   Pulse 73   Ht 5\' 11"  (1.803 m)   Wt 259 lb 6.4 oz (117.7 kg)   BMI 36.18 kg/m     General: Alert, oriented x3, no distress, obese Head: no evidence of trauma, PERRL, EOMI, no exophtalmos or lid lag, no myxedema, no xanthelasma; normal ears, nose and oropharynx Neck: normal jugular venous pulsations and no hepatojugular reflux; brisk carotid pulses without delay and no carotid bruits Chest: clear to auscultation, no signs of  consolidation by percussion or palpation, normal fremitus, symmetrical and full respiratory excursions Cardiovascular: normal position and quality of the apical impulse, regular rhythm, normal first and paradoxically split second heart sounds, no murmurs, rubs or gallops Abdomen: no tenderness or distention, no masses by palpation, no abnormal pulsatility or arterial bruits, normal bowel sounds, no hepatosplenomegaly Extremities: no clubbing, cyanosis or edema; 2+ radial, ulnar and brachial pulses bilaterally; 2+ right femoral, posterior tibial and dorsalis pedis pulses; 2+ left femoral, posterior tibial and dorsalis pedis pulses; no subclavian or femoral bruits Neurological: grossly nonfocal Psych: Normal mood and affect   Wt Readings from Last 3 Encounters:  06/19/17 259 lb 6.4 oz (117.7 kg)  01/31/17 248 lb 8 oz (112.7 kg)  11/18/16 262 lb 3.2 oz (118.9 kg)      Studies/Labs Reviewed:   EKG:  EKG is ordered today. It shows NSR, typical LBBB QRS 156 ms, QTc 454 ms  Recent Labs: 01/30/2017: ALT 34 01/31/2017: TSH 3.889 02/01/2017: BUN 27; Creatinine, Ser 0.91; Hemoglobin 15.0; Platelets 219; Potassium 3.6; Sodium 139   Lipid Panel Total cholesterol 223, HDL 65, triglycerides 93, LDL 139 (01/22/2015), hemoglobin  A1c 6%  Additional studies/ records that were reviewed today include:  Notes from Dr. Truett Perna in Care Everywhere Jan 14 and Dr. Logan Bores Jan 15   ASSESSMENT:    1. Chronic combined systolic and diastolic heart failure (HCC)   2. Coronary artery disease involving native coronary artery of native heart without angina pectoris   3. LBBB (left bundle branch block)   4. Hypercholesterolemia   5. Class 2 severe obesity due to excess calories with serious comorbidity and body mass index (BMI) of 36.0 to 36.9 in adult (HCC)   6. OSA (obstructive sleep apnea)   7. History of syncope     PLAN:  In order of problems listed above:  1. CHF: Appears euvolemic. NYHA functional class I. He has reached the target dose of bisoprolol 10 mg daily. I suggested that we try to increase the dose of Entresto, but he wants to wait.  He appears to have idiopathic dilated CMP, no diagnostic findings on cardiac MRI, no evidence of infiltrative disorder 2. CAD: Coronary lesions are minor and cannot explain his cardiomyopathy. He does not have angina pectoris. Target LDL<100, preferably less than 70. 3. LBBB: This is a chronic abnormality. If ventricular systolic function and clinical status deteriorates in the future despite medical therapy, he has multiple features predictive of good response to CRT-D. 4. Hyperlipidemia: Currently not on lipid-lowering meds and does not want to start them. 5. Severe obesity: needs to restart his weight loss efforts. Has prediabetes. Try to avoid atypical antipsychotics that worsen glucose tolerance, lose weight, exercise more frequently, improved diet.  6. Reported history of obstructive sleep apnea: repeatedly scheduled for evaluation, but never really confirmed since has canceled the tests ("how can I have sleep apnea if I never fall sleep"). 7. Syncope: Remote episode. No recurrent events. Retrospectively, suspect it was a vasovagal event   Medication Adjustments/Labs and Tests  Ordered: Current medicines are reviewed at length with the patient today.  Concerns regarding medicines are outlined above.  Medication changes, Labs and Tests ordered today are listed in the Patient Instructions below. Patient Instructions  Dr Royann Shivers recommends that you schedule a follow-up appointment in 6 months. You will receive a reminder letter in the mail two months in advance. If you don't receive a letter, please call our office to schedule the follow-up appointment.  If you  need a refill on your cardiac medications before your next appointment, please call your pharmacy.    Signed, Thurmon Fair, MD  06/19/2017 8:40 AM    [facility] [address] [place], [place], [place]  [address] Phone: [phone]; Fax: [phone]

## 2017-06-20 DIAGNOSIS — F4312 Post-traumatic stress disorder, chronic: Secondary | ICD-10-CM | POA: Diagnosis not present

## 2017-06-20 DIAGNOSIS — F5101 Primary insomnia: Secondary | ICD-10-CM | POA: Diagnosis not present

## 2017-06-29 IMAGING — CR DG CHEST 2V
2 series · 2 of 2 positions shown · non-contrast
Comparison: None

CLINICAL DATA: Dizziness, shortness of breath, nausea for 1 week,
abnormal EKG earlier today at [HOSPITAL]

EXAM:
CHEST  2 VIEW

[chest pa]
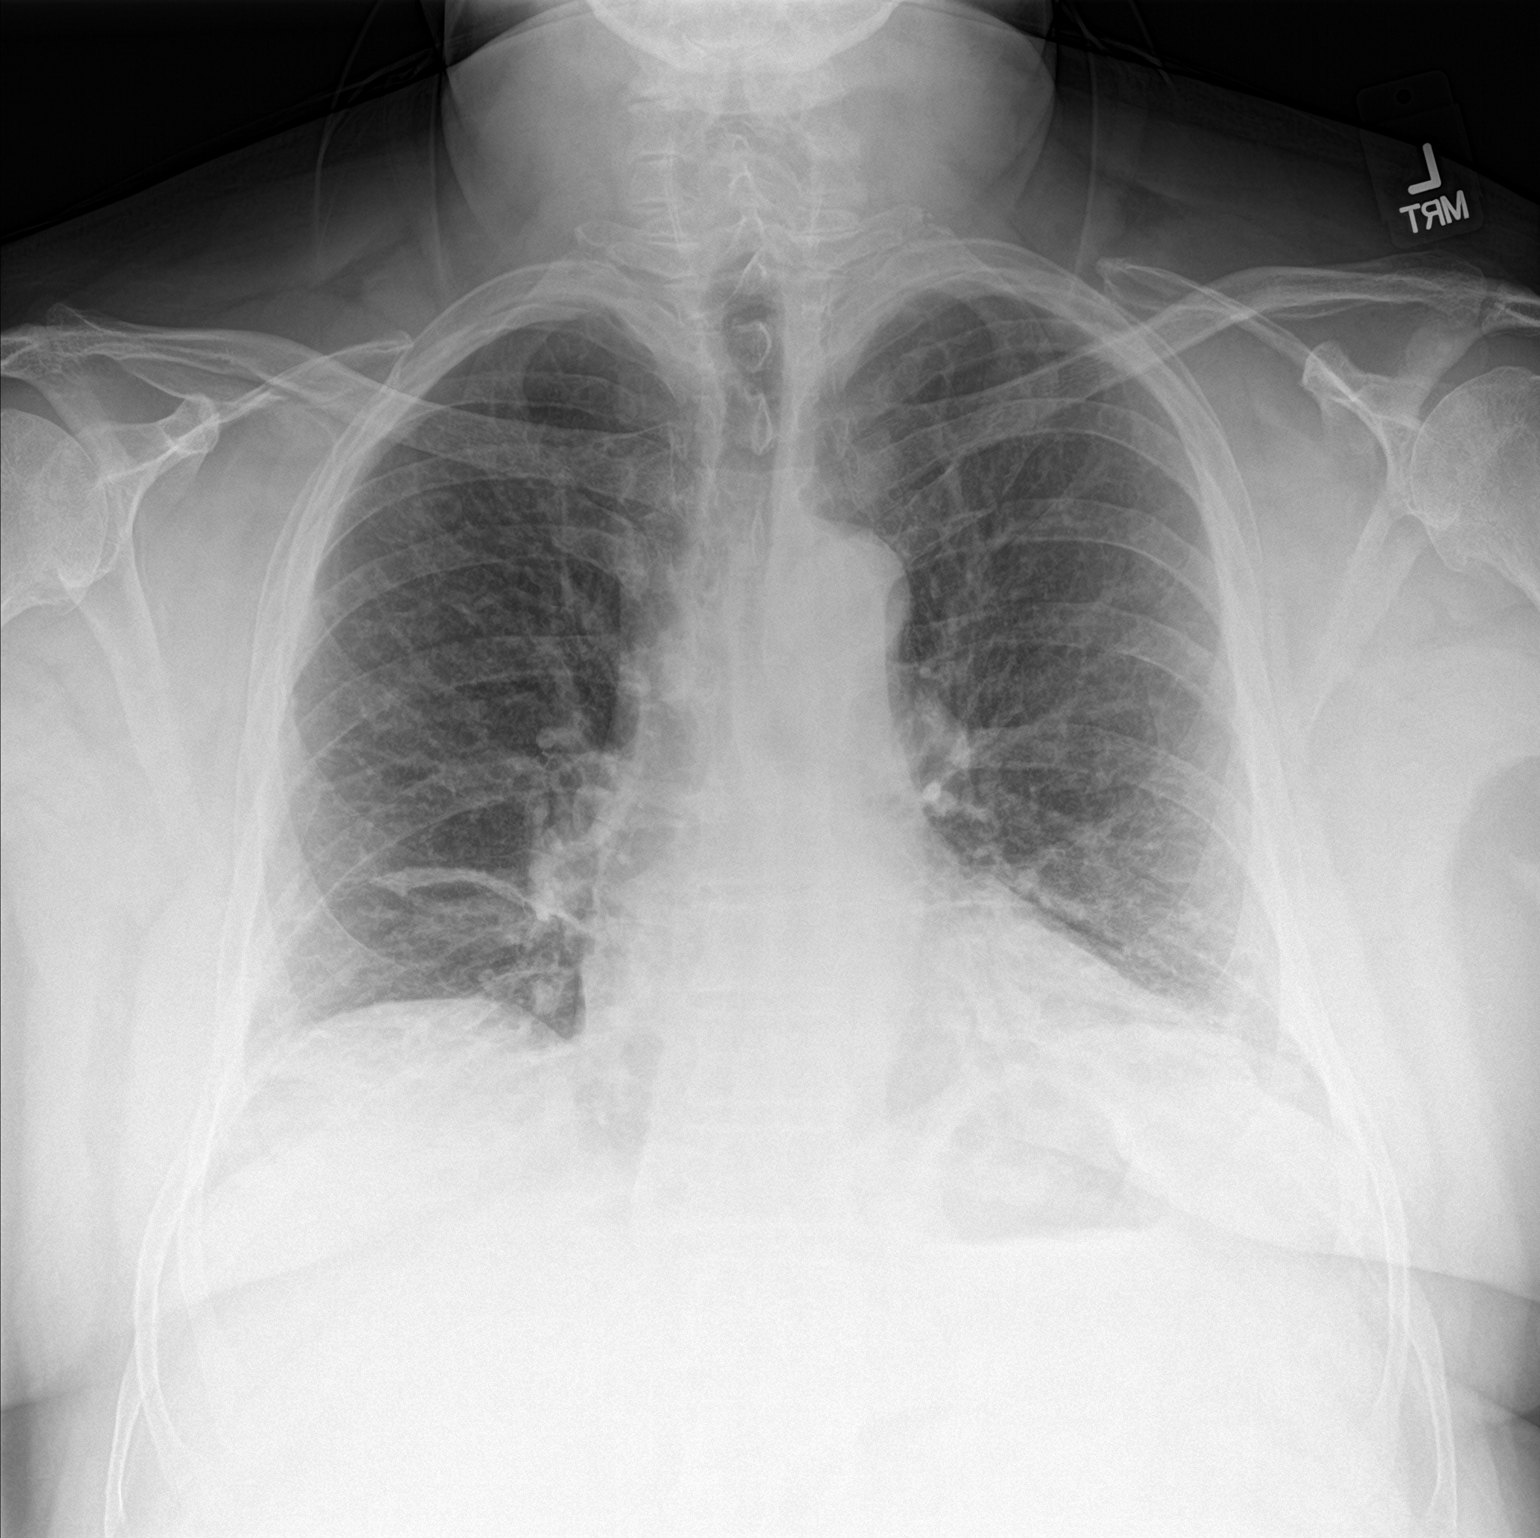

[chest lat]
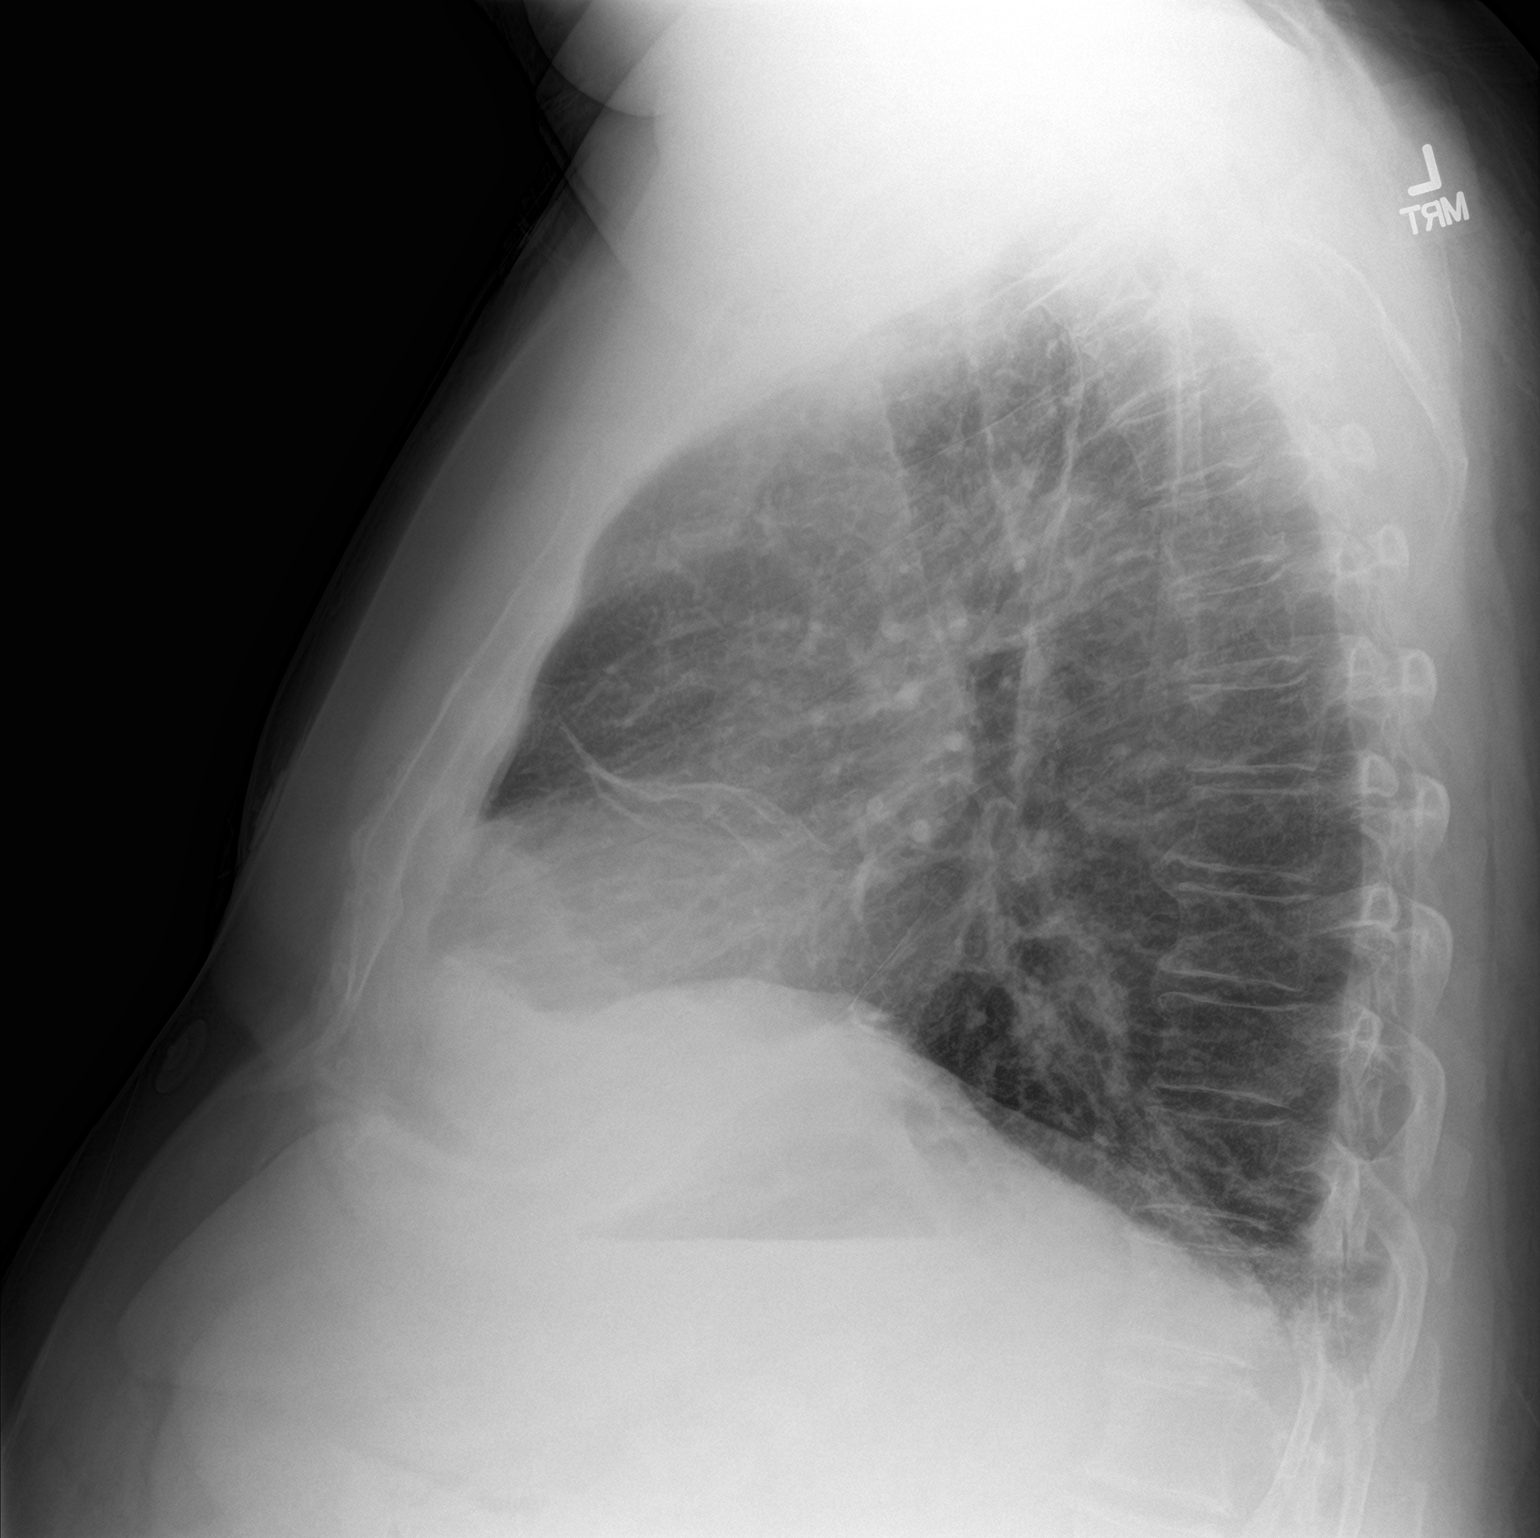

[2 of 2 positions shown; findings below may reference images not displayed]

FINDINGS: Borderline enlargement of cardiac silhouette.

Mediastinal contours and pulmonary vascularity normal.

Subsegmental atelectasis RIGHT middle lobe.

Remaining lungs clear.

No pleural effusion or pneumothorax.

Bones demineralized with endplate spur formation thoracic spine.
IMPRESSION: Borderline enlargement of cardiac silhouette.

Subsegmental atelectasis RIGHT middle lobe.

## 2017-07-01 DIAGNOSIS — J00 Acute nasopharyngitis [common cold]: Secondary | ICD-10-CM | POA: Diagnosis not present

## 2017-07-10 ENCOUNTER — Other Ambulatory Visit: Payer: Self-pay | Admitting: Cardiovascular Disease

## 2017-07-11 DIAGNOSIS — F5101 Primary insomnia: Secondary | ICD-10-CM | POA: Diagnosis not present

## 2017-07-11 DIAGNOSIS — F4312 Post-traumatic stress disorder, chronic: Secondary | ICD-10-CM | POA: Diagnosis not present

## 2017-07-18 DIAGNOSIS — F5101 Primary insomnia: Secondary | ICD-10-CM | POA: Diagnosis not present

## 2017-07-18 DIAGNOSIS — F4312 Post-traumatic stress disorder, chronic: Secondary | ICD-10-CM | POA: Diagnosis not present

## 2017-07-25 DIAGNOSIS — F5101 Primary insomnia: Secondary | ICD-10-CM | POA: Diagnosis not present

## 2017-07-25 DIAGNOSIS — F4312 Post-traumatic stress disorder, chronic: Secondary | ICD-10-CM | POA: Diagnosis not present

## 2017-08-07 DIAGNOSIS — I5022 Chronic systolic (congestive) heart failure: Secondary | ICD-10-CM | POA: Diagnosis not present

## 2017-08-07 DIAGNOSIS — R0609 Other forms of dyspnea: Secondary | ICD-10-CM | POA: Diagnosis not present

## 2017-08-08 DIAGNOSIS — F5101 Primary insomnia: Secondary | ICD-10-CM | POA: Diagnosis not present

## 2017-08-08 DIAGNOSIS — F4312 Post-traumatic stress disorder, chronic: Secondary | ICD-10-CM | POA: Diagnosis not present

## 2017-08-15 DIAGNOSIS — F5101 Primary insomnia: Secondary | ICD-10-CM | POA: Diagnosis not present

## 2017-08-15 DIAGNOSIS — F4312 Post-traumatic stress disorder, chronic: Secondary | ICD-10-CM | POA: Diagnosis not present

## 2017-08-22 DIAGNOSIS — F4312 Post-traumatic stress disorder, chronic: Secondary | ICD-10-CM | POA: Diagnosis not present

## 2017-08-22 DIAGNOSIS — F5101 Primary insomnia: Secondary | ICD-10-CM | POA: Diagnosis not present

## 2017-08-23 DIAGNOSIS — F3342 Major depressive disorder, recurrent, in full remission: Secondary | ICD-10-CM | POA: Diagnosis not present

## 2017-08-29 DIAGNOSIS — F4312 Post-traumatic stress disorder, chronic: Secondary | ICD-10-CM | POA: Diagnosis not present

## 2017-08-29 DIAGNOSIS — F5101 Primary insomnia: Secondary | ICD-10-CM | POA: Diagnosis not present

## 2017-09-05 DIAGNOSIS — F4312 Post-traumatic stress disorder, chronic: Secondary | ICD-10-CM | POA: Diagnosis not present

## 2017-09-05 DIAGNOSIS — F5101 Primary insomnia: Secondary | ICD-10-CM | POA: Diagnosis not present

## 2017-09-19 DIAGNOSIS — F4312 Post-traumatic stress disorder, chronic: Secondary | ICD-10-CM | POA: Diagnosis not present

## 2017-09-19 DIAGNOSIS — F5101 Primary insomnia: Secondary | ICD-10-CM | POA: Diagnosis not present

## 2017-09-27 DIAGNOSIS — F4312 Post-traumatic stress disorder, chronic: Secondary | ICD-10-CM | POA: Diagnosis not present

## 2017-09-27 DIAGNOSIS — F5101 Primary insomnia: Secondary | ICD-10-CM | POA: Diagnosis not present

## 2017-10-17 ENCOUNTER — Telehealth: Payer: Self-pay | Admitting: Cardiovascular Disease

## 2017-10-17 NOTE — Telephone Encounter (Signed)
LMTCB

## 2017-10-17 NOTE — Telephone Encounter (Signed)
New Message    Pt c/o Syncope: STAT if syncope occurred within 30 minutes and pt complains of lightheadedness High Priority if episode of passing out, completely, today or in last 24 hours   1. Did you pass out today? no  2. When is the last time you passed out? 06/22  3. Has this occurred multiple times? No   4. Did you have any symptoms prior to passing out? Just felt fatigued.

## 2017-10-21 ENCOUNTER — Encounter (HOSPITAL_BASED_OUTPATIENT_CLINIC_OR_DEPARTMENT_OTHER): Payer: Self-pay | Admitting: Emergency Medicine

## 2017-10-21 ENCOUNTER — Inpatient Hospital Stay (HOSPITAL_BASED_OUTPATIENT_CLINIC_OR_DEPARTMENT_OTHER)
Admission: EM | Admit: 2017-10-21 | Discharge: 2017-10-24 | DRG: 193 | Disposition: A | Discharge status: Home / Self Care | Payer: BLUE CROSS/BLUE SHIELD | Attending: Internal Medicine | Admitting: Internal Medicine

## 2017-10-21 ENCOUNTER — Emergency Department (HOSPITAL_BASED_OUTPATIENT_CLINIC_OR_DEPARTMENT_OTHER): Payer: BLUE CROSS/BLUE SHIELD

## 2017-10-21 ENCOUNTER — Other Ambulatory Visit: Payer: Self-pay

## 2017-10-21 DIAGNOSIS — R51 Headache: Secondary | ICD-10-CM | POA: Diagnosis not present

## 2017-10-21 DIAGNOSIS — R0602 Shortness of breath: Secondary | ICD-10-CM | POA: Diagnosis not present

## 2017-10-21 DIAGNOSIS — F1320 Sedative, hypnotic or anxiolytic dependence, uncomplicated: Secondary | ICD-10-CM | POA: Diagnosis present

## 2017-10-21 DIAGNOSIS — R0902 Hypoxemia: Secondary | ICD-10-CM

## 2017-10-21 DIAGNOSIS — Z6835 Body mass index (BMI) 35.0-35.9, adult: Secondary | ICD-10-CM | POA: Diagnosis not present

## 2017-10-21 DIAGNOSIS — F5104 Psychophysiologic insomnia: Secondary | ICD-10-CM | POA: Diagnosis not present

## 2017-10-21 DIAGNOSIS — M549 Dorsalgia, unspecified: Secondary | ICD-10-CM | POA: Diagnosis present

## 2017-10-21 DIAGNOSIS — I447 Left bundle-branch block, unspecified: Secondary | ICD-10-CM | POA: Diagnosis present

## 2017-10-21 DIAGNOSIS — I2510 Atherosclerotic heart disease of native coronary artery without angina pectoris: Secondary | ICD-10-CM | POA: Diagnosis not present

## 2017-10-21 DIAGNOSIS — K219 Gastro-esophageal reflux disease without esophagitis: Secondary | ICD-10-CM | POA: Diagnosis present

## 2017-10-21 DIAGNOSIS — N411 Chronic prostatitis: Secondary | ICD-10-CM | POA: Diagnosis not present

## 2017-10-21 DIAGNOSIS — E782 Mixed hyperlipidemia: Secondary | ICD-10-CM | POA: Diagnosis present

## 2017-10-21 DIAGNOSIS — G8929 Other chronic pain: Secondary | ICD-10-CM | POA: Diagnosis present

## 2017-10-21 DIAGNOSIS — F1020 Alcohol dependence, uncomplicated: Secondary | ICD-10-CM | POA: Diagnosis not present

## 2017-10-21 DIAGNOSIS — Z8249 Family history of ischemic heart disease and other diseases of the circulatory system: Secondary | ICD-10-CM

## 2017-10-21 DIAGNOSIS — R55 Syncope and collapse: Secondary | ICD-10-CM | POA: Diagnosis present

## 2017-10-21 DIAGNOSIS — G9340 Encephalopathy, unspecified: Secondary | ICD-10-CM | POA: Diagnosis not present

## 2017-10-21 DIAGNOSIS — J189 Pneumonia, unspecified organism: Secondary | ICD-10-CM | POA: Diagnosis present

## 2017-10-21 DIAGNOSIS — M26609 Unspecified temporomandibular joint disorder, unspecified side: Secondary | ICD-10-CM | POA: Diagnosis present

## 2017-10-21 DIAGNOSIS — J9601 Acute respiratory failure with hypoxia: Secondary | ICD-10-CM | POA: Diagnosis not present

## 2017-10-21 DIAGNOSIS — I5042 Chronic combined systolic (congestive) and diastolic (congestive) heart failure: Secondary | ICD-10-CM | POA: Diagnosis not present

## 2017-10-21 DIAGNOSIS — Z8673 Personal history of transient ischemic attack (TIA), and cerebral infarction without residual deficits: Secondary | ICD-10-CM

## 2017-10-21 DIAGNOSIS — G4733 Obstructive sleep apnea (adult) (pediatric): Secondary | ICD-10-CM | POA: Diagnosis not present

## 2017-10-21 DIAGNOSIS — I420 Dilated cardiomyopathy: Secondary | ICD-10-CM | POA: Diagnosis not present

## 2017-10-21 DIAGNOSIS — Z79899 Other long term (current) drug therapy: Secondary | ICD-10-CM

## 2017-10-21 DIAGNOSIS — K589 Irritable bowel syndrome without diarrhea: Secondary | ICD-10-CM | POA: Diagnosis not present

## 2017-10-21 DIAGNOSIS — Z9119 Patient's noncompliance with other medical treatment and regimen: Secondary | ICD-10-CM | POA: Diagnosis not present

## 2017-10-21 DIAGNOSIS — Z87442 Personal history of urinary calculi: Secondary | ICD-10-CM

## 2017-10-21 HISTORY — DX: Gastro-esophageal reflux disease without esophagitis: K21.9

## 2017-10-21 LAB — URINALYSIS, ROUTINE W REFLEX MICROSCOPIC
BILIRUBIN URINE: NEGATIVE
Glucose, UA: NEGATIVE mg/dL
HGB URINE DIPSTICK: NEGATIVE
Ketones, ur: NEGATIVE mg/dL
Leukocytes, UA: NEGATIVE
Nitrite: NEGATIVE
PROTEIN: NEGATIVE mg/dL
Specific Gravity, Urine: 1.015 (ref 1.005–1.030)
pH: 5.5 (ref 5.0–8.0)

## 2017-10-21 LAB — CBC
HCT: 47.7 % (ref 39.0–52.0)
Hemoglobin: 16.9 g/dL (ref 13.0–17.0)
MCH: 30.5 pg (ref 26.0–34.0)
MCHC: 35.4 g/dL (ref 30.0–36.0)
MCV: 85.9 fL (ref 78.0–100.0)
PLATELETS: 208 10*3/uL (ref 150–400)
RBC: 5.55 MIL/uL (ref 4.22–5.81)
RDW: 13.3 % (ref 11.5–15.5)
WBC: 6 10*3/uL (ref 4.0–10.5)

## 2017-10-21 LAB — BRAIN NATRIURETIC PEPTIDE: B NATRIURETIC PEPTIDE 5: 13.4 pg/mL (ref 0.0–100.0)

## 2017-10-21 LAB — TROPONIN I: Troponin I: <0.03 ng/mL (ref <0.03)

## 2017-10-21 LAB — BASIC METABOLIC PANEL
Anion gap: 9 (ref 5–15)
BUN: 23 mg/dL (ref 8–23)
CALCIUM: 8.9 mg/dL (ref 8.9–10.3)
CHLORIDE: 103 mmol/L (ref 98–111)
CO2: 24 mmol/L (ref 22–32)
CREATININE: 0.91 mg/dL (ref 0.61–1.24)
GFR calc Af Amer: >60 mL/min (ref >60)
GFR calc non Af Amer: >60 mL/min (ref >60)
GLUCOSE: 158 mg/dL — AB (ref 70–99)
Potassium: 4.1 mmol/L (ref 3.5–5.1)
Sodium: 136 mmol/L (ref 135–145)

## 2017-10-21 LAB — HEPATIC FUNCTION PANEL
ALBUMIN: 4.1 g/dL (ref 3.5–5.0)
ALT: 34 U/L (ref 0–44)
AST: 38 U/L (ref 15–41)
Alkaline Phosphatase: 78 U/L (ref 38–126)
Bilirubin, Direct: 0.2 mg/dL (ref 0.0–0.2)
Indirect Bilirubin: 0 mg/dL — ABNORMAL LOW (ref 0.3–0.9)
TOTAL PROTEIN: 7.7 g/dL (ref 6.5–8.1)
Total Bilirubin: 0.2 mg/dL — ABNORMAL LOW (ref 0.3–1.2)

## 2017-10-21 LAB — D-DIMER, QUANTITATIVE (NOT AT ARMC)

## 2017-10-21 LAB — AMMONIA: AMMONIA: 21 umol/L (ref 9–35)

## 2017-10-21 LAB — LIPASE, BLOOD: LIPASE: 32 U/L (ref 11–51)

## 2017-10-21 LAB — MAGNESIUM: Magnesium: 2 mg/dL (ref 1.7–2.4)

## 2017-10-21 IMAGING — XA Imaging study
2 series · 2 of 2 positions shown · non-contrast
Comparison: none

CLINICAL DATA: Coccydynia.

[Series 2: ortho standard · 1 of 1 slices shown (1 of 2)]
[im 1/1]
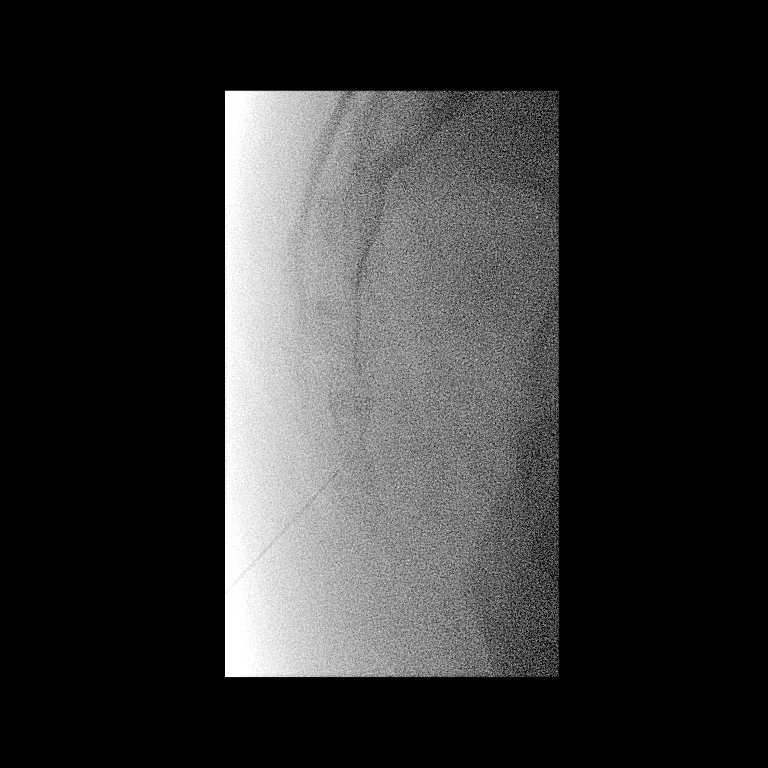

[Series 3: ortho standard · 1 of 1 slices shown (2 of 2)]
[im 1/1]
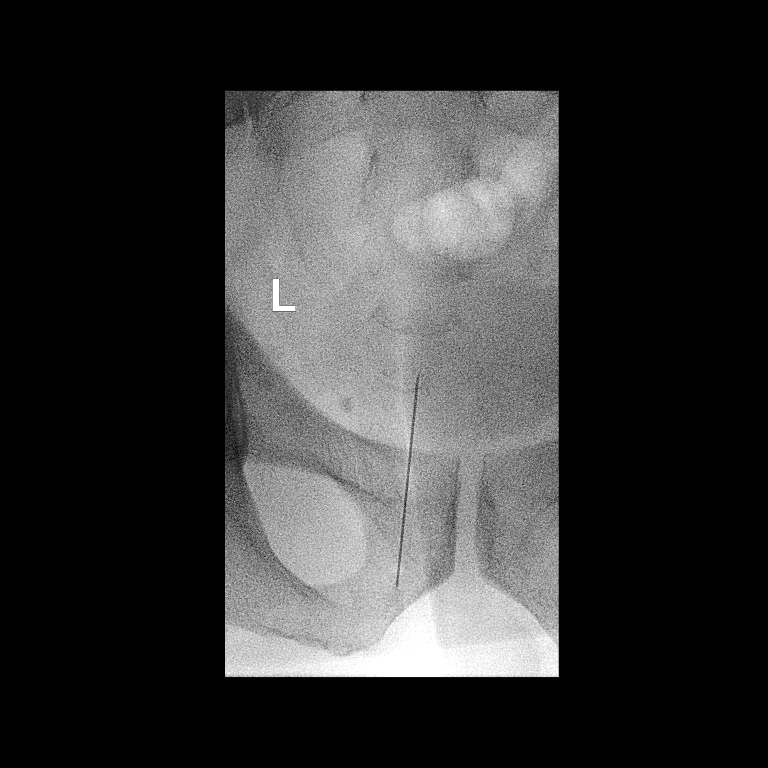

[2 of 2 positions shown; findings below may reference images not displayed]

FLUOROSCOPY TIME:  17 seconds corresponding to a Dose Area Product
of 30.6 ?Gy*m2

PROCEDURE:
The procedure, risks, benefits, and alternatives were explained to
the patient. Questions regarding the procedure were encouraged and
answered. The patient understands and consents to the procedure.

An appropriate skin entry site was chosen, cleansed with Betadine,
and anesthetized with 1% lidocaine.

COCCYGEAL STEROID INJECTION  :

The skin over the buttocks was copiously cleansed with Betadine
soap. An appropriate skin entry site was chosen and anesthetized
with 1% lidocaine.

A 22 gauge spinal needle was placed in the mid coccygeal region,
resulting in concordant reproduction of patient's pain.
Fluoroscopically, there was no osseous abnormality or dislocation.

THERAPEUTIC EPIDURAL INJECTION:

A total of 120 mg Depo-Medrol mixed with 2 mL 1% lidocaine was
injected along the course of the coccygeal segments, extending to
the coccygeal tip under additional fluoroscopic guidance, and was
well tolerated by the patient. There were no immediate
complications, and the patient was discharged 15 minutes following
the injection in good condition.

COMPLICATIONS:
None.
IMPRESSION: Technically successful coccygeal infiltration using a total 120 mg
Depo-Medrol and 2 mL 1% lidocaine. Concordant reproduction of pain
was rapidly relieved.

## 2017-10-21 MED ORDER — SACUBITRIL-VALSARTAN 24-26 MG PO TABS
1.0000 | ORAL_TABLET | Freq: Two times a day (BID) | ORAL | Status: DC
  Administered 2017-10-22 – 2017-10-24 (×5): 1 via ORAL
  Filled 2017-10-21 (×6): qty 1

## 2017-10-21 MED ORDER — DOXYCYCLINE HYCLATE 100 MG PO CAPS: 100.0000 mg | ORAL_CAPSULE | Freq: Two times a day (BID) | ORAL | 0 refills | Status: DC

## 2017-10-21 MED ORDER — ALOSETRON HCL 0.5 MG PO TABS
0.5000 mg | ORAL_TABLET | Freq: Two times a day (BID) | ORAL | Status: DC
  Administered 2017-10-22 – 2017-10-24 (×5): 0.5 mg via ORAL
  Filled 2017-10-21: qty 1

## 2017-10-21 MED ORDER — TRIAZOLAM 0.25 MG PO TABS
0.2500 mg | ORAL_TABLET | Freq: Every day | ORAL | Status: DC
  Administered 2017-10-22 – 2017-10-23 (×2): 0.25 mg via ORAL
  Filled 2017-10-21 (×2): qty 1

## 2017-10-21 MED ORDER — DULOXETINE HCL 60 MG PO CPEP
60.0000 mg | ORAL_CAPSULE | Freq: Every day | ORAL | Status: DC
  Administered 2017-10-22 – 2017-10-24 (×3): 60 mg via ORAL
  Filled 2017-10-21 (×3): qty 1

## 2017-10-21 MED ORDER — SODIUM CHLORIDE 0.9% FLUSH
3.0000 mL | Freq: Two times a day (BID) | INTRAVENOUS | Status: DC
  Administered 2017-10-21 – 2017-10-24 (×4): 3 mL via INTRAVENOUS

## 2017-10-21 MED ORDER — ACETAMINOPHEN 325 MG PO TABS: 650.0000 mg | ORAL_TABLET | Freq: Four times a day (QID) | ORAL | Status: DC | PRN

## 2017-10-21 MED ORDER — GABAPENTIN 300 MG PO CAPS
600.0000 mg | ORAL_CAPSULE | Freq: Three times a day (TID) | ORAL | Status: DC
  Administered 2017-10-22 – 2017-10-24 (×8): 600 mg via ORAL
  Filled 2017-10-21 (×10): qty 2

## 2017-10-21 MED ORDER — ONDANSETRON HCL 4 MG/2ML IJ SOLN: 4.0000 mg | Freq: Four times a day (QID) | INTRAMUSCULAR | Status: DC | PRN

## 2017-10-21 MED ORDER — CEFTRIAXONE SODIUM 1 G IJ SOLR
1.0000 g | Freq: Once | INTRAMUSCULAR | Status: AC
  Administered 2017-10-21: 1 g via INTRAVENOUS
  Filled 2017-10-21: qty 10

## 2017-10-21 MED ORDER — PANTOPRAZOLE SODIUM 40 MG PO TBEC
40.0000 mg | DELAYED_RELEASE_TABLET | Freq: Every day | ORAL | Status: DC
  Administered 2017-10-22 – 2017-10-24 (×3): 40 mg via ORAL
  Filled 2017-10-21 (×3): qty 1

## 2017-10-21 MED ORDER — AZITHROMYCIN 500 MG IV SOLR
INTRAVENOUS | Status: AC
  Filled 2017-10-21: qty 500

## 2017-10-21 MED ORDER — SODIUM CHLORIDE 0.9 % IV SOLN
1.0000 g | INTRAVENOUS | Status: DC
  Administered 2017-10-22 – 2017-10-23 (×2): 1 g via INTRAVENOUS
  Filled 2017-10-21 (×2): qty 1

## 2017-10-21 MED ORDER — SODIUM CHLORIDE 0.9 % IV SOLN
500.0000 mg | INTRAVENOUS | Status: DC
  Administered 2017-10-22 – 2017-10-23 (×2): 500 mg via INTRAVENOUS
  Filled 2017-10-21 (×2): qty 500

## 2017-10-21 MED ORDER — ONDANSETRON HCL 4 MG PO TABS: 4.0000 mg | ORAL_TABLET | Freq: Four times a day (QID) | ORAL | Status: DC | PRN

## 2017-10-21 MED ORDER — SODIUM CHLORIDE 0.9 % IV SOLN
500.0000 mg | Freq: Once | INTRAVENOUS | Status: AC
  Administered 2017-10-21: 500 mg via INTRAVENOUS
  Filled 2017-10-21: qty 500

## 2017-10-21 MED ORDER — ACETAMINOPHEN 650 MG RE SUPP: 650.0000 mg | Freq: Four times a day (QID) | RECTAL | Status: DC | PRN

## 2017-10-21 NOTE — ED Notes (Signed)
Placed on 2 l/m [place], SpO2 88-91% while talking.  Denies SOB at rest.  SpO2 after O2 92%

## 2017-10-21 NOTE — ED Triage Notes (Signed)
Pt with syncope 1 week ago while out of town. Hx of CHF. Also reports ongoing fatigue over the past week.

## 2017-10-21 NOTE — ED Provider Notes (Signed)
[facility] EMERGENCY DEPARTMENT Provider Note   CSN: [id number] Arrival date & time: 10/21/17  1041     History   Chief Complaint Chief Complaint  Patient presents with  . Loss of Consciousness    HPI Alejandro Ramos is a 62 y.o. male.  The history is provided by the patient, medical records and a relative. No language interpreter was used.  Illness  This is a new problem. The current episode started more than 1 week ago. The problem occurs constantly. The problem has not changed since onset.Pertinent negatives include no chest pain, no abdominal pain, no headaches and no shortness of breath. Nothing aggravates the symptoms. Nothing relieves the symptoms. He has tried nothing for the symptoms. The treatment provided no relief.    Past Medical History:  Diagnosis Date  . Back pain, chronic 12/03/2011  . CAD (coronary artery disease)   . CHF (congestive heart failure) (HCC)   . Chronic prostatitis   . 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 GERD (gastroesophageal reflux disease)   . H/O: drug dependency (HCC)   . IBS (irritable bowel syndrome)   . Kidney stone   . LBBB (left bundle branch block)   . OSA (obstructive sleep apnea)   . Sleep apnea 12/03/2011   Has a C-PAP machine, but does not use it.  . TMJ syndrome 12/03/2011    Patient Active Problem List   Diagnosis Date Noted  . Ambien use disorder, severe (HCC) 01/31/2017  . Withdrawal from sedative drug (HCC) 01/31/2017  . Ambien use disorder, severe, dependence (HCC) 01/31/2017  . Chronic insomnia 01/25/2016  . Chronic combined systolic and diastolic heart failure (HCC) 01/06/2016  . Cardiomyopathy (HCC) 01/06/2016  . LBBB (left bundle branch block) 01/06/2016  . Pre-diabetes 01/06/2016  . CAD (coronary artery disease) 12/10/2015  . Mixed hyperlipidemia 12/10/2015  . Severe obesity (BMI 35.0-35.9 with comorbidity) (HCC) 12/10/2015  . OSA (obstructive sleep  apnea) 12/10/2015  . Syncope 12/10/2015  . Anxiety 03/07/2012  . Alcohol dependence (HCC) 02/28/2012  . Back pain, chronic 12/03/2011    Past Surgical History:  Procedure Laterality Date  . CARDIAC CATHETERIZATION N/A 01/12/2016   Procedure: Right/Left Heart Cath and Coronary Angiography;  Surgeon: Thurmon Fair, MD; RCA 15%, LM 25-30%, LVEDP 12 mm Hg, PWCP mean 14 mm Hg, PA 35/20, mean 26 mm Hg, CO 4.9 L/min, CI 2 L/min (sq)        Home Medications    Prior to Admission medications   Medication Sig Start Date End Date Taking? Authorizing Provider  acetaminophen (TYLENOL) 500 MG tablet Take 1,000 mg by mouth every 6 (six) hours as needed for moderate pain or headache.    [provider]  bisoprolol (ZEBETA) 10 MG tablet Take 1 tablet (10 mg total) by mouth at bedtime. 06/19/17   Croitoru, Mihai, MD  DULoxetine (CYMBALTA) 60 MG capsule TAKE 1 CAPSULE (60 MG TOTAL) BY MOUTH ONCE DAILY. 01/22/17   [provider]  ENTRESTO 24-26 MG TAKE 1 TABLET TWICE A DAY 07/10/17   Croitoru, Mihai, MD  finasteride (PROSCAR) 5 MG tablet Take 5 mg by mouth daily.  01/18/16   [provider]  gabapentin (NEURONTIN) 600 MG tablet Take 600 mg by mouth 3 (three) times daily.     [provider]  hydrOXYzine (ATARAX/VISTARIL) 50 MG tablet 1-2 TABLETS BY MOUTH ONE HOUR BEFORE SLEEP 01/22/17   [provider]  Multiple Vitamins-Minerals (MENS 50+ MULTI VITAMIN/MIN PO) Take  1 tablet by mouth daily.    [provider]  omeprazole (PRILOSEC) 40 MG capsule TAKE ONE CAPSULE BY MOUTH TWICE A DAY 30 MINUTES BEFORE BREAKFAST AND DINNER 01/05/17   [provider]  spironolactone (ALDACTONE) 25 MG tablet TAKE 0.5 TABLETS (12.5 MG TOTAL) BY MOUTH ONCE DAILY. 10/31/16   Croitoru, Mihai, MD  Testosterone (AXIRON) 30 MG/ACT SOLN PLACE (30 MG) ONTO THE SKIN DAILY 01/14/16   [provider]  tiZANidine (ZANAFLEX) 2 MG tablet Take 2 mg by mouth every 8 (eight) hours  as needed for muscle spasms.  01/26/17   [provider]    Family History Family History  Problem Relation Age of Onset  . Paranoid behavior Father   . Congestive Heart Failure Father   . Arrhythmia Mother        has a PPM  . Heart attack Maternal Grandmother        73s  . Arrhythmia Maternal Grandfather        79s  . Heart attack Paternal Grandmother 84  . Heart attack Paternal Grandfather 20    Social History Social History   Tobacco Use  . Smoking status: Never Smoker  . Smokeless tobacco: Never Used  Substance Use Topics  . Alcohol use: No  . Drug use: No    Types: Hydrocodone, Other-see comments     Allergies   Patient has no known allergies.   Review of Systems Review of Systems  Constitutional: Positive for fatigue. Negative for chills, diaphoresis and fever.  HENT: Negative for congestion.   Eyes: Negative for visual disturbance.  Respiratory: Negative for cough, chest tightness, shortness of breath and wheezing.   Cardiovascular: Negative for chest pain, palpitations and leg swelling.  Gastrointestinal: Negative for abdominal pain, constipation, diarrhea, nausea and vomiting.  Genitourinary: Negative for flank pain and frequency.  Musculoskeletal: Negative for back pain, neck pain and neck stiffness.  Skin: Negative for rash and wound.  Neurological: Positive for light-headedness. Negative for syncope, weakness, numbness and headaches.  Psychiatric/Behavioral: Negative for agitation and confusion.  All other systems reviewed and are negative.    Physical Exam Updated Vital Signs BP (!) 120/98 (BP Location: Left Arm)   Pulse 90   Temp 98 F (36.7 C) (Oral)   Resp 20   Ht 5\' 10"  (1.778 m)   Wt 117.9 kg (260 lb)   SpO2 92%   BMI 37.31 kg/m   Physical Exam  Constitutional: He is oriented to person, place, and time. He appears well-developed and well-nourished. No distress.  HENT:  Head: Normocephalic.  Nose: Nose normal.    Mouth/Throat: Oropharynx is clear and moist. No oropharyngeal exudate.  Eyes: Pupils are equal, round, and reactive to light. Conjunctivae and EOM are normal.  Neck: Normal range of motion.  Cardiovascular: Normal rate and intact distal pulses.  No murmur heard. Pulmonary/Chest: Effort normal. No respiratory distress. He has no wheezes. He has rales. He exhibits no tenderness.  Abdominal: Soft. Bowel sounds are normal. There is no tenderness.  Musculoskeletal: He exhibits no edema or tenderness.  Neurological: He is alert and oriented to person, place, and time. No cranial nerve deficit or sensory deficit. He exhibits normal muscle tone. Coordination normal.  Skin: Skin is warm. Capillary refill takes less than 2 seconds. He is not diaphoretic. No erythema. No pallor.  Nursing note and vitals reviewed.    ED Treatments / Results  Labs (all labs ordered are listed, but only abnormal results are displayed) Labs Reviewed  BASIC METABOLIC PANEL - Abnormal; Notable for the following components:      Result Value   Glucose, Bld 158 (*)    All other components within normal limits  HEPATIC FUNCTION PANEL - Abnormal; Notable for the following components:   Total Bilirubin 0.2 (*)    Indirect Bilirubin 0.0 (*)    All other components within normal limits  URINE CULTURE  CULTURE, BLOOD (ROUTINE X 2)  CULTURE, BLOOD (ROUTINE X 2)  CBC  URINALYSIS, ROUTINE W REFLEX MICROSCOPIC  TROPONIN I  LIPASE, BLOOD  D-DIMER, QUANTITATIVE (NOT AT [facility])  AMMONIA  BRAIN NATRIURETIC PEPTIDE  MAGNESIUM  TROPONIN I    EKG EKG Interpretation  Date/Time:  Saturday October 21 2017 10:58:31 EDT Ventricular Rate:  89 PR Interval:    QRS Duration: 142 QT Interval:  386 QTC Calculation: 470 R Axis:   51 Text Interpretation:  Sinus rhythm Left bundle branch block When compared to prior, similar LBBB.  No STEMI Confirmed by Theda Belfast ([id number]) on 10/21/2017 11:18:22 AM   Radiology Dg Chest 2  View  Result Date: 10/21/2017 CLINICAL DATA:  Pt feeling lethargic; SOB x 1 week; pt clammy to touch; pt has h/o hereditary heart condition that causes low heart ejection fracture; pt states he is not on oxygen at home; pt states he has no h/o lung problems EXAM: CHEST - 2 VIEW COMPARISON:  01/30/2017 FINDINGS: Patchy subsegmental atelectasis or infiltrates in the lung bases, new since previous. Stable linear scarring in the right middle lobe. Heart size upper limits normal for technique. No effusion. Visualized bones unremarkable. IMPRESSION: New bibasilar subsegmental atelectasis versus early infiltrate. Electronically Signed   By: Corlis Leak M.D.   On: 10/21/2017 13:16   Ct Head Wo Contrast  Result Date: 10/21/2017 CLINICAL DATA:  No h/o stroke; pt lethargic x 1 week; pt is on heart meds for low ejection fraction; no c/o headache; EXAM: CT HEAD WITHOUT CONTRAST TECHNIQUE: Contiguous axial images were obtained from the base of the skull through the vertex without intravenous contrast. COMPARISON:  12/25/2016 FINDINGS: Brain: No evidence of acute infarction, hemorrhage, hydrocephalus, extra-axial collection or mass lesion/mass effect. Mild atrophy. Vascular: No hyperdense vessel or unexpected calcification. Skull: Normal. Negative for fracture or focal lesion. Sinuses/Orbits: Retention cyst or polyp in the right maxillary sinus. Otherwise negative Other: None. IMPRESSION: Negative for bleed or other acute intracranial process. Electronically Signed   By: Corlis Leak M.D.   On: 10/21/2017 13:17    Procedures Procedures (including critical care time)  Medications Ordered in ED Medications  azithromycin (ZITHROMAX) 500 mg in sodium chloride 0.9 % 250 mL IVPB (500 mg Intravenous New Bag/Given 10/21/17 1625)  azithromycin (ZITHROMAX) 500 MG injection (has no administration in time range)  cefTRIAXone (ROCEPHIN) 1 g in sodium chloride 0.9 % 100 mL IVPB (0 g Intravenous Stopped 10/21/17 1625)     Initial  Impression / Assessment and Plan / ED Course  I have reviewed the triage vital signs and the nursing notes.  Pertinent labs & imaging results that were available during my care of the patient were reviewed by me and considered in my medical decision making (see chart for details).     Alejandro Ramos is a 62 y.o. male with a past medical history significant for CAD, congestive heart failure with an EF of 25 to 30% in 9/17, prior stroke, GERD, hyperlipidemia, sleep apnea, and obesity who presents with severe fatigue, multiple syncopal episodes, and lightheadedness.  Patient that 1 week  ago, patient was in [place] at a son's college graduation.  He reports that he was feeling fatigued at the event.  He reports that while flying home, he reports passing out approximately 4 times while in the air.   He reports that since returning to [facility], he has felt very fatigued and tired and has no energy.  He denies chest pain or palpitations.  He reports no fevers or chills but does report lightheaded spells.  He reports that he has cardiology care in both [facility] and at [facility] and after he spoke with cardiology today, they told him to come to the emergency department for evaluation of possible CHF exacerbation or other causes of fatigue.  Patient reports that his legs have not been more edematous and he denies any history of DVT/PE.  He denies nausea, vomiting, conservation, diarrhea, or urinary symptoms. He denies significant cough.  He does seem somewhat confused with conversation at times.  On exam, patient's lungs were coarse in the bases.  Patient was hypoxic on arrival on room air and had some cyanosis of the lips.  Patient placed on 2 L nasal cannula with improvement in oxygen saturations into the low 90s.  Patient's legs had minimal edema.  Chest and abdomen were nontender and back was nontender.  Patient got lightheaded when he sat up.  Due to patient's cardiac history, EKG was obtained showing a  similar left bundle branch to prior.  Patient had work-up including head CT given his occasionally acting confused and on my exam, patient had an episode where his right pupil looked ovoid.  He reports no visual ab normalities in the past.  Head CT showed no intracranial abnormality such as bleed.  Chest x-ray showed evidence of infiltrate in the bases.  Given the patient's new hypoxia, intermittent confusion, and severe fatigue, patient will have blood cultures and be given antibiotics for community associated pneumonia.  Showed negative troponin, negative d-dimer, and negative BNP.  Electrolytes were overall reassuring.  Glucose  was not low.  No leukocytosis.  Next  Suspect the pneumonia is the cause of his symptoms.  Patient will be admitted for further management.   Final Clinical Impressions(s) / ED Diagnoses   Final diagnoses:  Syncope, unspecified syncope type  Community acquired pneumonia, unspecified laterality  Hypoxia    ED Discharge Orders        Ordered    doxycycline (VIBRAMYCIN) 100 MG capsule  2 times daily,   Status:  Discontinued     10/21/17 1137     Clinical Impression: 1. Syncope, unspecified syncope type   2. Community acquired pneumonia, unspecified laterality   3. Hypoxia     Disposition: Admit  This note was prepared with assistance of Dragon voice recognition software. Occasional wrong-word or sound-a-like substitutions may have occurred due to the inherent limitations of voice recognition software.     Tegeler, Canary Brim, MD 10/21/17 [phone]

## 2017-10-21 NOTE — ED Notes (Signed)
Pt attempted urine specimen, but lid was on urinal; pt requests water to drink in order to provide another sample; water ok'd by EDP and provided to pt.
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
## 2017-10-21 NOTE — ED Notes (Signed)
Attempted to call report to [facility]; nurse unavailable; this RN contact info provided. 

## 2017-10-22 ENCOUNTER — Inpatient Hospital Stay (HOSPITAL_COMMUNITY): Payer: BLUE CROSS/BLUE SHIELD

## 2017-10-22 DIAGNOSIS — I2510 Atherosclerotic heart disease of native coronary artery without angina pectoris: Secondary | ICD-10-CM

## 2017-10-22 DIAGNOSIS — I5042 Chronic combined systolic (congestive) and diastolic (congestive) heart failure: Secondary | ICD-10-CM

## 2017-10-22 DIAGNOSIS — Z6835 Body mass index (BMI) 35.0-35.9, adult: Secondary | ICD-10-CM

## 2017-10-22 DIAGNOSIS — J189 Pneumonia, unspecified organism: Principal | ICD-10-CM

## 2017-10-22 DIAGNOSIS — R55 Syncope and collapse: Secondary | ICD-10-CM

## 2017-10-22 DIAGNOSIS — J9601 Acute respiratory failure with hypoxia: Secondary | ICD-10-CM

## 2017-10-22 DIAGNOSIS — I447 Left bundle-branch block, unspecified: Secondary | ICD-10-CM

## 2017-10-22 LAB — HEMOGLOBIN AND HEMATOCRIT, BLOOD
HEMATOCRIT: 49.1 % (ref 39.0–52.0)
HEMOGLOBIN: 17.2 g/dL — AB (ref 13.0–17.0)

## 2017-10-22 LAB — HEMOGLOBIN A1C
Hgb A1c MFr Bld: 5.9 % — ABNORMAL HIGH (ref 4.8–5.6)
Mean Plasma Glucose: 122.63 mg/dL

## 2017-10-22 LAB — COMPREHENSIVE METABOLIC PANEL
ALT: 36 U/L (ref 0–44)
AST: 31 U/L (ref 15–41)
Albumin: 3.6 g/dL (ref 3.5–5.0)
Alkaline Phosphatase: 61 U/L (ref 38–126)
Anion gap: 9 (ref 5–15)
BUN: 18 mg/dL (ref 8–23)
CHLORIDE: 103 mmol/L (ref 98–111)
CO2: 25 mmol/L (ref 22–32)
CREATININE: 0.92 mg/dL (ref 0.61–1.24)
Calcium: 8.7 mg/dL — ABNORMAL LOW (ref 8.9–10.3)
GFR calc Af Amer: >60 mL/min (ref >60)
GFR calc non Af Amer: >60 mL/min (ref >60)
Glucose, Bld: 105 mg/dL — ABNORMAL HIGH (ref 70–99)
Potassium: 4 mmol/L (ref 3.5–5.1)
SODIUM: 137 mmol/L (ref 135–145)
Total Bilirubin: 0.6 mg/dL (ref 0.3–1.2)
Total Protein: 6.6 g/dL (ref 6.5–8.1)

## 2017-10-22 LAB — CBC
HEMATOCRIT: 46.6 % (ref 39.0–52.0)
Hemoglobin: 16.2 g/dL (ref 13.0–17.0)
MCH: 30.7 pg (ref 26.0–34.0)
MCHC: 34.8 g/dL (ref 30.0–36.0)
MCV: 88.3 fL (ref 78.0–100.0)
Platelets: 216 10*3/uL (ref 150–400)
RBC: 5.28 MIL/uL (ref 4.22–5.81)
RDW: 13.3 % (ref 11.5–15.5)
WBC: 7.3 10*3/uL (ref 4.0–10.5)

## 2017-10-22 LAB — ECHOCARDIOGRAM COMPLETE
Height: 70 in
Weight: 4408 oz

## 2017-10-22 LAB — TSH: TSH: 4.111 u[IU]/mL (ref 0.350–4.500)

## 2017-10-22 LAB — TROPONIN I: Troponin I: <0.03 ng/mL (ref <0.03)

## 2017-10-22 LAB — HIV ANTIBODY (ROUTINE TESTING W REFLEX): HIV Screen 4th Generation wRfx: NONREACTIVE

## 2017-10-22 MED ORDER — TIZANIDINE HCL 4 MG PO TABS
2.0000 mg | ORAL_TABLET | Freq: Three times a day (TID) | ORAL | Status: DC | PRN
Start: 2017-10-22 — End: 2017-10-24
  Administered 2017-10-22 – 2017-10-23 (×2): 2 mg via ORAL
  Filled 2017-10-22 (×2): qty 1

## 2017-10-22 MED ORDER — BISOPROLOL FUMARATE 5 MG PO TABS
5.0000 mg | ORAL_TABLET | Freq: Every day | ORAL | Status: DC
  Administered 2017-10-22 – 2017-10-23 (×2): 5 mg via ORAL
  Filled 2017-10-22 (×2): qty 1

## 2017-10-22 MED ORDER — LORATADINE 10 MG PO TABS
10.0000 mg | ORAL_TABLET | Freq: Every day | ORAL | Status: DC
  Administered 2017-10-22 – 2017-10-24 (×3): 10 mg via ORAL
  Filled 2017-10-22 (×3): qty 1

## 2017-10-22 MED ORDER — PERFLUTREN LIPID MICROSPHERE
1.0000 mL | INTRAVENOUS | Status: AC | PRN
  Administered 2017-10-22: 3 mL via INTRAVENOUS
  Filled 2017-10-22: qty 10

## 2017-10-22 MED ORDER — HYDROXYZINE HCL 25 MG PO TABS: 25.0000 mg | ORAL_TABLET | Freq: Every evening | ORAL | Status: DC | PRN

## 2017-10-22 MED ORDER — FLUTICASONE PROPIONATE 50 MCG/ACT NA SUSP
2.0000 | Freq: Every day | NASAL | Status: DC
  Administered 2017-10-22 – 2017-10-24 (×3): 2 via NASAL
  Filled 2017-10-22: qty 16

## 2017-10-22 MED ORDER — FINASTERIDE 5 MG PO TABS
5.0000 mg | ORAL_TABLET | Freq: Every day | ORAL | Status: DC
  Administered 2017-10-22 – 2017-10-24 (×3): 5 mg via ORAL
  Filled 2017-10-22 (×3): qty 1

## 2017-10-22 MED ORDER — GI COCKTAIL ~~LOC~~: 30.0000 mL | Freq: Three times a day (TID) | ORAL | Status: DC | PRN

## 2017-10-22 MED ORDER — IPRATROPIUM-ALBUTEROL 0.5-2.5 (3) MG/3ML IN SOLN
3.0000 mL | Freq: Three times a day (TID) | RESPIRATORY_TRACT | Status: DC
  Administered 2017-10-22 – 2017-10-23 (×3): 3 mL via RESPIRATORY_TRACT
  Filled 2017-10-22 (×3): qty 3

## 2017-10-22 NOTE — Progress Notes (Signed)
  Echocardiogram 2D Echocardiogram has been performed.  Carry Alejandro Ramos 10/22/2017, 9:21 AM
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
## 2017-10-22 NOTE — Consult Note (Signed)
CONSULTATION NOTE   Patient Name: Alejandro Ramos Date of Encounter: 10/22/2017 Cardiologist: No primary care provider on file.  Chief Complaint   Fatigued  Patient Profile   62 year old male with history of nonischemic cardiomyopathy, most recent LVEF 46% by cMRI, LBBB, OSA and recurrent syncope - presents with syncope/fatigue.  HPI   Alejandro Ramos is a 62 y.o. male who is being seen today for the evaluation of syncope at the request of Dr. Grandville Silos.  This is a pleasant 62 year old male with a history of nonischemic cardiomyopathy and EF 25 to 30%, however improved to 46% on medical therapy, coronary artery disease, substance abuse, known LBBB, OSA but not compliant with BiPAP, prior stroke, GERD, dyslipidemia and morbid obesity.  He presents with progressive fatigue and syncope which occurred approximately a week ago when he was visiting his son in [place].  Apparently he had several episodes for which he passed out and felt fatigued and no energy while on the returning flight.  He contact the office and apparently was told to go to the emergency department.  He is additionally managed by Dr. Kyla Balzarine at [facility] for heart failure.  The fact that he passed out or feels very fatigued is a chronic issue for him and he is been seen both in our clinic as well as at [facility] for this.  On admission to [place] he was found to be hypoxic with oxygen saturations in the upper 80s, but he denies shortness of breath.  Chest x-ray shows bibasilar infiltrates suggestive of subsegmental atelectasis versus early infiltrate.  Head CT was negative for acute bleed.  He is being treated for possible community acquired pneumonia.  He denies recent weight gain, swelling, chest pain, orthopnea or other heart failure symptoms.  Troponin is negative x3 and BNP is only 13.4.  PMHx   Past Medical History:  Diagnosis Date  . Back pain, chronic 12/03/2011  . CAD (coronary artery disease)     . CHF (congestive heart failure) ([place])   . Chronic prostatitis   . 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 GERD (gastroesophageal reflux disease)   . H/O: drug dependency ([place])   . IBS (irritable bowel syndrome)   . Kidney stone   . LBBB (left bundle branch block)   . OSA (obstructive sleep apnea)   . Sleep apnea 12/03/2011   Has a C-PAP machine, but does not use it.  . 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 CARDIAC CATHETERIZATION N/A 01/12/2016   Procedure: Right/Left Heart Cath and Coronary Angiography;  Surgeon: Sanda Klein, MD; RCA 15%, LM 25-30%, LVEDP 12 mm Hg, PWCP mean 14 mm Hg, PA 35/20, mean 26 mm Hg, CO 4.9 L/min, CI 2 L/min (sq)    FAMHx   Family History  Problem Relation Age of Onset  . Paranoid behavior Father   . Congestive Heart Failure Father   . Arrhythmia Mother        has a PPM  . Heart attack Maternal Grandmother        48s  . Arrhythmia Maternal Grandfather        73s  . Heart attack Paternal Grandmother 93  . Heart attack Paternal Grandfather 66    SOCHx    reports that he has never smoked. He has never used smokeless tobacco. He reports that he does not drink alcohol or use drugs.  Outpatient Medications  No current facility-administered medications on file prior to encounter.    Current Outpatient Medications on File Prior to Encounter  Medication Sig Dispense Refill  . alosetron (LOTRONEX) 0.5 MG tablet Take 0.5 mg by mouth 2 (two) times daily.  4  . bisoprolol (ZEBETA) 10 MG tablet Take 1 tablet (10 mg total) by mouth at bedtime. (Patient taking differently: Take 5 mg by mouth at bedtime. ) 30 tablet 11  . DULoxetine (CYMBALTA) 60 MG capsule TAKE 1 CAPSULE (60 MG TOTAL) BY MOUTH ONCE DAILY.  11  . ENTRESTO 24-26 MG TAKE 1 TABLET TWICE A DAY 180 tablet 3  . finasteride (PROSCAR) 5 MG tablet Take 5 mg by mouth daily.   4  . gabapentin (NEURONTIN) 600 MG  tablet Take 600 mg by mouth 3 (three) times daily.     Marland Kitchen ibuprofen (ADVIL,MOTRIN) 200 MG tablet Take 800 mg by mouth every 6 (six) hours as needed for moderate pain.    . Multiple Vitamins-Minerals (MENS 50+ MULTI VITAMIN/MIN PO) Take 1 tablet by mouth daily.    Marland Kitchen omeprazole (PRILOSEC) 40 MG capsule TAKE ONE CAPSULE BY MOUTH TWICE A DAY 30 MINUTES BEFORE BREAKFAST AND DINNER  3  . spironolactone (ALDACTONE) 25 MG tablet TAKE 0.5 TABLETS (12.5 MG TOTAL) BY MOUTH ONCE DAILY. 15 tablet 10  . triazolam (HALCION) 0.25 MG tablet Take 0.25 mg by mouth at bedtime.    Marland Kitchen acetaminophen (TYLENOL) 500 MG tablet Take 1,000 mg by mouth every 6 (six) hours as needed for moderate pain or headache.    . hydrOXYzine (ATARAX/VISTARIL) 50 MG tablet TAKE 1-2 TABLETS BY MOUTH ONE HOUR BEFORE SLEEP  11  . Testosterone (AXIRON) 30 MG/ACT SOLN PLACE (30 MG) ONTO THE SKIN DAILY    . tiZANidine (ZANAFLEX) 2 MG tablet Take 2 mg by mouth every 8 (eight) hours as needed for muscle spasms.   0    Inpatient Medications    Scheduled Meds: . alosetron  0.5 mg Oral BID  . DULoxetine  60 mg Oral Daily  . gabapentin  600 mg Oral TID  . pantoprazole  40 mg Oral Daily  . sacubitril-valsartan  1 tablet Oral BID  . sodium chloride flush  3 mL Intravenous Q12H  . triazolam  0.25 mg Oral QHS    Continuous Infusions: . azithromycin    . cefTRIAXone (ROCEPHIN)  IV      PRN Meds: acetaminophen **OR** acetaminophen, ondansetron **OR** ondansetron (ZOFRAN) IV, perflutren lipid microspheres (DEFINITY) IV suspension   ALLERGIES   No Known Allergies  ROS   Pertinent items noted in HPI and remainder of comprehensive ROS otherwise negative.  Vitals   Vitals:   10/22/17 0500 10/22/17 0634 10/22/17 0638 10/22/17 0641  BP:  120/72 125/82 120/81  Pulse:  75 83 93  Resp:  18    Temp:  98 F (36.7 C)    TempSrc:  Oral    SpO2:  95%    Weight: 275 lb 8 oz (125 kg)     Height:        Intake/Output Summary (Last 24 hours)  at 10/22/2017 0857 Last data filed at 10/22/2017 0827 Gross per 24 hour  Intake -  Output 650 ml  Net -650 ml   Filed Weights   10/21/17 1050 10/22/17 0500  Weight: 260 lb (117.9 kg) 275 lb 8 oz (125 kg)    Physical Exam   General appearance: alert and no distress Neck: no carotid bruit, no JVD and thyroid not  enlarged, symmetric, no tenderness/mass/nodules Lungs: diminished breath sounds LLL Heart: regular rate and rhythm Abdomen: soft, non-tender; bowel sounds normal; no masses,  no organomegaly and obese Extremities: extremities normal, atraumatic, no cyanosis or edema Pulses: 2+ and symmetric Skin: Skin color, texture, turgor normal. No rashes or lesions Neurologic: Grossly normal Psych: Somewhat animated  Labs   Results for orders placed or performed during the hospital encounter of 10/21/17 (from the past 48 hour(s))  Basic metabolic panel     Status: Abnormal   Collection Time: 10/21/17 11:12 AM  Result Value Ref Range   Sodium 136 135 - 145 mmol/L   Potassium 4.1 3.5 - 5.1 mmol/L   Chloride 103 98 - 111 mmol/L    Comment: Please note change in reference range.   CO2 24 22 - 32 mmol/L   Glucose, Bld 158 (H) 70 - 99 mg/dL    Comment: Please note change in reference range.   BUN 23 8 - 23 mg/dL    Comment: Please note change in reference range.   Creatinine, Ser 0.91 0.61 - 1.24 mg/dL   Calcium 8.9 8.9 - 10.3 mg/dL   GFR calc non Af Amer >60 >60 mL/min   GFR calc Af Amer >60 >60 mL/min    Comment: (NOTE) The eGFR has been calculated using the CKD EPI equation. This calculation has not been validated in all clinical situations. eGFR's persistently <60 mL/min signify possible Chronic Kidney Disease.    Anion gap 9 5 - 15    Comment: Performed at [facility], [address] [place], [place] [address]  CBC     Status: None   Collection Time: 10/21/17 11:12 AM  Result Value Ref Range   WBC 6.0 4.0 - 10.5 K/uL   RBC 5.55 4.22 - 5.81 MIL/uL    Hemoglobin 16.9 13.0 - 17.0 g/dL   HCT 47.7 39.0 - 52.0 %   MCV 85.9 78.0 - 100.0 fL   MCH 30.5 26.0 - 34.0 pg   MCHC 35.4 30.0 - 36.0 g/dL   RDW 13.3 11.5 - 15.5 %   Platelets 208 150 - 400 K/uL    Comment: Performed at [facility], [address] [place], [place] [address]  Hepatic function panel     Status: Abnormal   Collection Time: 10/21/17 11:12 AM  Result Value Ref Range   Total Protein 7.7 6.5 - 8.1 g/dL   Albumin 4.1 3.5 - 5.0 g/dL   AST 38 15 - 41 U/L   ALT 34 0 - 44 U/L    Comment: Please note change in reference range.   Alkaline Phosphatase 78 38 - 126 U/L   Total Bilirubin 0.2 (L) 0.3 - 1.2 mg/dL   Bilirubin, Direct 0.2 0.0 - 0.2 mg/dL    Comment: Please note change in reference range.   Indirect Bilirubin 0.0 (L) 0.3 - 0.9 mg/dL    Comment: Performed at [facility], [address] [place], [place] [address]  Troponin I     Status: None   Collection Time: 10/21/17 11:12 AM  Result Value Ref Range   Troponin I <0.03 <0.03 ng/mL    Comment: Performed at [facility], [address] [place], [place] [address]  Lipase, blood     Status: None   Collection Time: 10/21/17 11:12 AM  Result Value Ref Range   Lipase 32 11 - 51 U/L    Comment: Performed at [facility], [address] [place], [place] [address]  D-dimer, quantitative (not at [facility])     Status: None   Collection Time: 10/21/17 11:12 AM  Result Value Ref Range   D-Dimer, Quant <0.27 0.00 - 0.50 ug/mL-FEU    Comment: (NOTE) At the manufacturer cut-off of 0.50 ug/mL FEU, this assay has been documented to exclude PE with a sensitivity and negative predictive value of 97 to 99%.  At this time, this assay has not been approved by the FDA to exclude DVT/VTE. Results should be correlated with clinical presentation. Performed at [facility], [address] [place], [place] [address]   Brain natriuretic peptide     Status: None   Collection Time:  10/21/17 11:12 AM  Result Value Ref Range   B Natriuretic Peptide 13.4 0.0 - 100.0 pg/mL    Comment: Performed at [facility], [address] [place], [place] [address]  Magnesium     Status: None   Collection Time: 10/21/17 11:12 AM  Result Value Ref Range   Magnesium 2.0 1.7 - 2.4 mg/dL    Comment: Performed at [facility], [address] [place], [place] [address]  Ammonia     Status: None   Collection Time: 10/21/17 12:20 PM  Result Value Ref Range   Ammonia 21 9 - 35 umol/L    Comment: Performed at [facility], [address] [place], [place] [address]  Urinalysis, Routine w reflex microscopic     Status: None   Collection Time: 10/21/17  3:02 PM  Result Value Ref Range   Color, Urine YELLOW YELLOW   APPearance CLEAR CLEAR   Specific Gravity, Urine 1.015 1.005 - 1.030   pH 5.5 5.0 - 8.0   Glucose, UA NEGATIVE NEGATIVE mg/dL   Hgb urine dipstick NEGATIVE NEGATIVE   Bilirubin Urine NEGATIVE NEGATIVE   Ketones, ur NEGATIVE NEGATIVE mg/dL   Protein, ur NEGATIVE NEGATIVE mg/dL   Nitrite NEGATIVE NEGATIVE   Leukocytes, UA NEGATIVE NEGATIVE    Comment: Microscopic not done on urines with negative protein, blood, leukocytes, nitrite, or glucose < 500 mg/dL. Performed at [facility], [address] [place], [place] [address]   Troponin I (q 6hr x 3)     Status: None   Collection Time: 10/21/17  8:25 PM  Result Value Ref Range   Troponin I <0.03 <0.03 ng/mL    Comment: Performed at [facility], [place] [address], [place], [place] [address]  Troponin I (q 6hr x 3)     Status: None   Collection Time: 10/22/17  2:12 AM  Result Value Ref Range   Troponin I <0.03 <0.03 ng/mL    Comment: Performed at [facility], [place] [address] [place], [place] [address]  TSH     Status: None   Collection Time: 10/22/17  2:12 AM  Result Value Ref Range   TSH 4.111 0.350 - 4.500 uIU/mL    Comment: Performed by a  3rd Generation assay with a functional sensitivity of <=0.01 uIU/mL. Performed at [facility], [place] [address], [place], [place] [address]   Hemoglobin A1c     Status: Abnormal   Collection Time: 10/22/17  2:12 AM  Result Value Ref Range   Hgb A1c MFr Bld 5.9 (H) 4.8 - 5.6 %    Comment: (NOTE) Pre diabetes:          5.7%-6.4%  Diabetes:              >6.4% Glycemic control for   <7.0% adults with diabetes    Mean Plasma Glucose 122.63 mg/dL    Comment: Performed at [facility] [address] [place], [place] [address]  Comprehensive metabolic panel     Status: Abnormal   Collection Time: 10/22/17  2:12 AM  Result Value Ref Range   Sodium 137 135 - 145 mmol/L   Potassium 4.0 3.5 - 5.1 mmol/L   Chloride 103 98 - 111 mmol/L    Comment: Please note change in reference range.   CO2 25 22 - 32 mmol/L   Glucose, Bld 105 (H) 70 - 99 mg/dL    Comment: Please note change in reference range.   BUN 18 8 - 23 mg/dL    Comment: Please note change in reference range.   Creatinine, Ser 0.92 0.61 - 1.24 mg/dL   Calcium 8.7 (L) 8.9 - 10.3 mg/dL   Total Protein 6.6 6.5 - 8.1 g/dL   Albumin 3.6 3.5 - 5.0 g/dL   AST 31 15 - 41 U/L   ALT 36 0 - 44 U/L    Comment: Please note change in reference range.   Alkaline Phosphatase 61 38 - 126 U/L   Total Bilirubin 0.6 0.3 - 1.2 mg/dL   GFR calc non Af Amer >60 >60 mL/min   GFR calc Af Amer >60 >60 mL/min    Comment: (NOTE) The eGFR has been calculated using the CKD EPI equation. This calculation has not been validated in all clinical situations. eGFR's persistently <60 mL/min signify possible Chronic Kidney Disease.    Anion gap 9 5 - 15    Comment: Performed at [facility], [place] [address], [place], [place] [address]  CBC     Status: None   Collection Time: 10/22/17  2:12 AM  Result Value Ref Range   WBC 7.3 4.0 - 10.5 K/uL   RBC 5.28 4.22 - 5.81 MIL/uL   Hemoglobin 16.2 13.0 - 17.0 g/dL   HCT 46.6  39.0 - 52.0 %   MCV 88.3 78.0 - 100.0 fL   MCH 30.7 26.0 - 34.0 pg   MCHC 34.8 30.0 - 36.0 g/dL   RDW 13.3 11.5 - 15.5 %   Platelets 216 150 - 400 K/uL    Comment: Performed at [facility], [place] [address] [place], [place] [address]    ECG   Sinus rhythm 89, LBBB- Personally Reviewed  Telemetry   Sinus rhythm- Personally Reviewed  Radiology   Dg Chest 2 View  Result Date: 10/21/2017 CLINICAL DATA:  Pt feeling lethargic; SOB x 1 week; pt clammy to touch; pt has h/o hereditary heart condition that causes low heart ejection fracture; pt states he is not on oxygen at home; pt states he has no h/o lung problems EXAM: CHEST - 2 VIEW COMPARISON:  01/30/2017 FINDINGS: Patchy subsegmental atelectasis or infiltrates in the lung bases, new since previous. Stable linear scarring in the right middle lobe. Heart size upper limits normal for technique. No effusion. Visualized bones unremarkable. IMPRESSION: New bibasilar subsegmental atelectasis versus early infiltrate. Electronically Signed   By: Lucrezia Europe M.D.   On: 10/21/2017 13:16   Ct Head Wo Contrast  Result Date: 10/21/2017 CLINICAL DATA:  No h/o stroke; pt lethargic x 1 week; pt is on heart meds for low ejection fraction; no c/o headache; EXAM: CT HEAD WITHOUT CONTRAST TECHNIQUE: Contiguous axial  images were obtained from the base of the skull through the vertex without intravenous contrast. COMPARISON:  12/25/2016 FINDINGS: Brain: No evidence of acute infarction, hemorrhage, hydrocephalus, extra-axial collection or mass lesion/mass effect. Mild atrophy. Vascular: No hyperdense vessel or unexpected calcification. Skull: Normal. Negative for fracture or focal lesion. Sinuses/Orbits: Retention cyst or polyp in the right maxillary sinus. Otherwise negative Other: None. IMPRESSION: Negative for bleed or other acute intracranial process. Electronically Signed   By: Lucrezia Europe M.D.   On: 10/21/2017 13:17    Cardiac Studies    Echo pending  Impression   1. Active Problems: 2.   CAD (coronary artery disease) 3.   Severe obesity (BMI 35.0-35.9 with comorbidity) ([place]) 4.   Syncope 5.   Chronic combined systolic and diastolic heart failure ([place]) 6.   LBBB (left bundle branch block) 7.   Ambien use disorder, severe, dependence ([place]) 8.   Acute respiratory failure with hypoxia ([facility]) 9.   CAP (community acquired pneumonia) 31.   Recommendation   1. Alejandro Ramos reports increasing fatigue and recurrent drowsiness/syncope.  He has had similar symptoms in the past.  He was noted to be hypoxic but not symptomatic necessarily.  Imaging suggests either bibasilar atelectasis or early infiltrate -he is therefore being treated for community-acquired pneumonia. D-dimer is negative. BNP is near normal at 13.4. I do not see evidence of heart failure on exam with low BNP and troponins are all negative.  Echo was in progress and I briefly reviewed at the bedside - LVEF is at least 45%, if not higher. No signs of elevated LV filling pressure. Would recommend checking orthostatic vitals today, however, labs don't suggest overdiuresis. Continue his current medical therapy and work-up for possible pulmonary process causing fatigue and hypoxemia and recommend outpatient follow-up with Dr. Sallyanne Kuster and with his cardiologist at [facility] after discharge.  Thanks for the consultation. Cardiology will sign-off.  Time Spent Directly with Patient:  I have spent a total of 45 minutes with the patient reviewing hospital notes, telemetry, EKGs, labs and examining the patient as well as establishing an assessment and plan that was discussed personally with the patient.  > 50% of time was spent in direct patient care.  Length of Stay:  LOS: 1 day   Pixie Casino, MD, [facility], [place] Director of the Advanced Lipid Disorders &  Cardiovascular Risk Reduction Clinic Diplomate of the American Board of  Clinical Lipidology Attending Cardiologist  Direct Dial: [phone]  Fax: [phone]  Website:  [email or web address] 10/22/2017, 8:57 AM

## 2017-10-23 LAB — CBC WITH DIFFERENTIAL/PLATELET
BASOS ABS: 0 10*3/uL (ref 0.0–0.1)
Basophils Relative: 0 %
EOS ABS: 0.2 10*3/uL (ref 0.0–0.7)
Eosinophils Relative: 3 %
HCT: 47.2 % (ref 39.0–52.0)
Hemoglobin: 16.1 g/dL (ref 13.0–17.0)
LYMPHS PCT: 25 %
Lymphs Abs: 1.7 10*3/uL (ref 0.7–4.0)
MCH: 30.4 pg (ref 26.0–34.0)
MCHC: 34.1 g/dL (ref 30.0–36.0)
MCV: 89.2 fL (ref 78.0–100.0)
Monocytes Absolute: 1.1 10*3/uL — ABNORMAL HIGH (ref 0.1–1.0)
Monocytes Relative: 15 %
Neutro Abs: 4 10*3/uL (ref 1.7–7.7)
Neutrophils Relative %: 57 %
Platelets: 222 10*3/uL (ref 150–400)
RBC: 5.29 MIL/uL (ref 4.22–5.81)
RDW: 13.5 % (ref 11.5–15.5)
WBC: 7.1 10*3/uL (ref 4.0–10.5)

## 2017-10-23 LAB — URINE CULTURE

## 2017-10-23 LAB — STREP PNEUMONIAE URINARY ANTIGEN: Strep Pneumo Urinary Antigen: NEGATIVE

## 2017-10-23 LAB — BASIC METABOLIC PANEL
ANION GAP: 9 (ref 5–15)
BUN: 19 mg/dL (ref 8–23)
CO2: 28 mmol/L (ref 22–32)
Calcium: 9.3 mg/dL (ref 8.9–10.3)
Chloride: 104 mmol/L (ref 98–111)
Creatinine, Ser: 0.9 mg/dL (ref 0.61–1.24)
Glucose, Bld: 109 mg/dL — ABNORMAL HIGH (ref 70–99)
POTASSIUM: 3.9 mmol/L (ref 3.5–5.1)
SODIUM: 141 mmol/L (ref 135–145)

## 2017-10-23 MED ORDER — IPRATROPIUM-ALBUTEROL 0.5-2.5 (3) MG/3ML IN SOLN: 3.0000 mL | RESPIRATORY_TRACT | Status: DC | PRN

## 2017-10-23 MED ORDER — SPIRONOLACTONE 12.5 MG HALF TABLET
12.5000 mg | ORAL_TABLET | Freq: Every day | ORAL | Status: DC
  Administered 2017-10-23 – 2017-10-24 (×2): 12.5 mg via ORAL
  Filled 2017-10-23 (×2): qty 1

## 2017-10-23 NOTE — Evaluation (Signed)
Occupational Therapy Evaluation Patient Details Name: Alejandro Ramos MRN: [id number] DOB: 02/21/1956 Today's Date: 10/23/2017    History of Present Illness Patient 62 year old gentleman history of nonischemic cardiomyopathy EF 25 to 30% improved to near normal at 46% with medical therapy, coronary artery disease, history of drug dependency, IBS, OSA, left bundle branch block, severe obesity, history of CVA, gastroesophageal reflux disease, hyperlipidemia presented with fatigue episodes of syncope when he was out of town in [place] 1 week prior to admission.  Also complains of lightheadedness that may have improved with sitting down and drinking water.  Patient also with complaints of 4 episodes of near passing out while flying with complaints of feeling very fatigued with decreased energy.  Patient presented to [facility] ED noted to be hypoxic with sats of 88%.  Chest x-ray done concerning for probable early pneumonia.  Patient placed empirically on IV antibiotics.  Cardiology consulted due to concerns for syncope versus near syncopal episodes.   Clinical Impression   Pt admitted with the above. Pt currently with functional limitations due to the deficits listed below (see OT Problem List).  Pt will benefit from skilled OT to increase their safety and independence with ADL and functional mobility for ADL to facilitate discharge to venue listed below.     Follow Up Recommendations  Supervision - Intermittent;Home health OT    Equipment Recommendations  None recommended by OT    Recommendations for Other Services       Precautions / Restrictions Precautions Precautions: Fall      Mobility Bed Mobility Overal bed mobility: Needs Assistance Bed Mobility: Supine to Sit     Supine to sit: Supervision        Transfers Overall transfer level: Needs assistance Equipment used: Rolling walker (2 wheeled) Transfers: Sit to/from [employer] Sit to Stand: Min  assist Stand pivot transfers: Min assist                ADL either performed or assessed with clinical judgement   ADL Overall ADL's : Needs assistance/impaired Eating/Feeding: Set up;Sitting   Grooming: Set up;Sitting   Upper Body Bathing: Set up;Sitting   Lower Body Bathing: Moderate assistance;Sit to/from stand;Cueing for safety;Cueing for sequencing   Upper Body Dressing : Minimal assistance;Sitting   Lower Body Dressing: Moderate assistance;Sit to/from stand;Cueing for sequencing;Cueing for safety   Toilet Transfer: Minimal assistance;Stand-pivot;RW Toilet Transfer Details (indicate cue type and reason): bed to chair Toileting- Clothing Manipulation and Hygiene: Moderate assistance;Sit to/from stand         General ADL Comments: pt has a caregiver several times a week.  Pt does voice he doesnt get out of bed often and feels depressed. Pt had been on phone with couselour prior to OT session     Vision Baseline Vision/History: Wears glasses Patient Visual Report: No change from baseline              Pertinent Vitals/Pain Pain Assessment: No/denies pain     Hand Dominance     Extremity/Trunk Assessment Upper Extremity Assessment Upper Extremity Assessment: Generalized weakness           Communication Communication Communication: No difficulties   Cognition Arousal/Alertness: Awake/alert Behavior During Therapy: WFL for tasks assessed/performed Overall Cognitive Status: Within Functional Limits for tasks assessed                                 General Comments: pt  does talk alot and needs to be redirected   General Comments               Home Living   Living Arrangements: Alone                               Additional Comments: pt has caregiver from [facility]      Prior Functioning/Environment Level of Independence: Independent                 OT Problem List: Decreased strength;Decreased  activity tolerance;Impaired balance (sitting and/or standing)      OT Treatment/Interventions: Self-care/ADL training;Patient/family education    OT Goals(Current goals can be found in the care plan section) Acute Rehab OT Goals Patient Stated Goal: home with caregiver OT Goal Formulation: With patient Time For Goal Achievement: 11/06/17 Potential to Achieve Goals: Good  OT Frequency: Min 2X/week              AM-PAC PT "6 Clicks" Daily Activity     Outcome Measure Help from another person eating meals?: None Help from another person taking care of personal grooming?: A Little Help from another person toileting, which includes using toliet, bedpan, or urinal?: A Little Help from another person bathing (including washing, rinsing, drying)?: A Little Help from another person to put on and taking off regular upper body clothing?: A Little Help from another person to put on and taking off regular lower body clothing?: A Little 6 Click Score: 19   End of Session Equipment Utilized During Treatment: Rolling walker Nurse Communication: Mobility status  Activity Tolerance: Patient tolerated treatment well Patient left: in chair;with call bell/phone within reach  OT Visit Diagnosis: Unsteadiness on feet (R26.81);Other abnormalities of gait and mobility (R26.89);History of falling (Z91.81);Muscle weakness (generalized) (M62.81)                Time: 1240-1300 OT Time Calculation (min): 20 min Charges:  OT General Charges $OT Visit: 1 Visit OT Evaluation $OT Eval Moderate Complexity: 1 Mod G-Codes:     Lise Auer, [place] [phone]  Einar Crow D 10/23/2017, 1:50 PM

## 2017-10-23 NOTE — Evaluation (Signed)
Physical Therapy Evaluation Patient Details Name: Alejandro Ramos MRN: [id number] DOB: June 16, 1955 Today's Date: 10/23/2017   History of Present Illness  62 yo male admitted with acute respiratory failure, fatigue, weakness, dx Pna. Hx of CHF, syncope, CAD, back pain, OSA, obesity, CVA, LBBB    Clinical Impression  On eval, pt required Min guard-Min assist for mobility. He walked ~285 feet around the unit. He is mildly unsteady and lightheaded. No overt LOB. O2 sat level >90% on RA during session. He is motivated to increase his mobility/activity. Encouraged pt to ask nursing if they can walk with him again later today. Will follow during hospital stay.     Follow Up Recommendations Home health PT;Supervision - Intermittent    Equipment Recommendations  None recommended by PT    Recommendations for Other Services       Precautions / Restrictions Precautions Precautions: Fall Restrictions Weight Bearing Restrictions: No      Mobility  Bed Mobility Overal bed mobility: Needs Assistance Bed Mobility: Supine to Sit     Supine to sit: Supervision     General bed mobility comments: oob in recliner  Transfers Overall transfer level: Needs assistance Equipment used: Rolling walker (2 wheeled) Transfers: Sit to/from Stand Sit to Stand: Min guard Stand pivot transfers: Min assist       General transfer comment: close guard for safety  Ambulation/Gait Ambulation/Gait assistance: Min assist;Min guard Gait Distance (Feet): 285 Feet Assistive device: (therapist's arm) Gait Pattern/deviations: Step-through pattern;Drifts right/left;Staggering left;Staggering right     General Gait Details: Pt requested to walk down hallway with his arm hooked around therapist's arm. No overt LOB. He is mildly unsteady. O2 sat >90% on RA. He tolerated distance well. Mild lightheadedness  Stairs            Wheelchair Mobility    Modified Rankin (Stroke Patients Only)        Balance Overall balance assessment: Mild deficits observed, not formally tested                                           Pertinent Vitals/Pain Pain Assessment: No/denies pain    Home Living Family/patient expects to be discharged to:: Private residence Living Arrangements: Alone             Home Equipment: None Additional Comments: pt has caregiver from [facility]    Prior Function Level of Independence: Independent               Hand Dominance        Extremity/Trunk Assessment   Upper Extremity Assessment Upper Extremity Assessment: Defer to OT evaluation    Lower Extremity Assessment Lower Extremity Assessment: Generalized weakness    Cervical / Trunk Assessment Cervical / Trunk Assessment: Normal  Communication   Communication: No difficulties  Cognition Arousal/Alertness: Awake/alert Behavior During Therapy: WFL for tasks assessed/performed Overall Cognitive Status: Within Functional Limits for tasks assessed                                 General Comments: very talkative      General Comments      Exercises     Assessment/Plan    PT Assessment Patient needs continued PT services  PT Problem List Decreased balance;Decreased mobility;Decreased strength       PT Treatment  Interventions Gait training;Functional mobility training;Therapeutic activities;Therapeutic exercise;Balance training;Patient/family education    PT Goals (Current goals can be found in the Care Plan section)  Acute Rehab PT Goals Patient Stated Goal: home tomorrow PT Goal Formulation: With patient Time For Goal Achievement: 11/06/17 Potential to Achieve Goals: Good    Frequency Min 3X/week   Barriers to discharge        Co-evaluation               AM-PAC PT "6 Clicks" Daily Activity  Outcome Measure Difficulty turning over in bed (including adjusting bedclothes, sheets and blankets)?: A Little Difficulty  moving from lying on back to sitting on the side of the bed? : A Little Difficulty sitting down on and standing up from a chair with arms (e.g., wheelchair, bedside commode, etc,.)?: A Little Help needed moving to and from a bed to chair (including a wheelchair)?: A Little Help needed walking in hospital room?: A Little Help needed climbing 3-5 steps with a railing? : A Little 6 Click Score: 18    End of Session   Activity Tolerance: Patient tolerated treatment well Patient left: in chair;with call bell/phone within reach   PT Visit Diagnosis: Unsteadiness on feet (R26.81);Muscle weakness (generalized) (M62.81)    Time: 1610-9604 PT Time Calculation (min) (ACUTE ONLY): 17 min   Charges:   PT Evaluation $PT Eval Moderate Complexity: 1 Mod     PT G Codes:          Rebeca Alert, MPT Pager: [phone]
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
## 2017-10-23 NOTE — Telephone Encounter (Signed)
Patient is currently admitted

## 2017-10-23 NOTE — Progress Notes (Signed)
PT Cancellation Note  Patient Details Name: Alejandro Ramos MRN: [id number] DOB: 09-11-1955   Cancelled Treatment:    Reason Eval/Treat Not Completed: Attempted PT evaluation-pt declined to participate at this time. He was on a phone call that he wished to continue. Will check back later today.    Rebeca Alert, MPT Pager: [phone]

## 2017-10-24 LAB — BASIC METABOLIC PANEL
ANION GAP: 6 (ref 5–15)
BUN: 22 mg/dL (ref 8–23)
CALCIUM: 8.8 mg/dL — AB (ref 8.9–10.3)
CO2: 30 mmol/L (ref 22–32)
CREATININE: 0.9 mg/dL (ref 0.61–1.24)
Chloride: 104 mmol/L (ref 98–111)
GFR calc Af Amer: >60 mL/min (ref >60)
GFR calc non Af Amer: >60 mL/min (ref >60)
GLUCOSE: 107 mg/dL — AB (ref 70–99)
Potassium: 3.8 mmol/L (ref 3.5–5.1)
SODIUM: 140 mmol/L (ref 135–145)

## 2017-10-24 LAB — LEGIONELLA PNEUMOPHILA SEROGP 1 UR AG: L. pneumophila Serogp 1 Ur Ag: NEGATIVE

## 2017-10-24 MED ORDER — CEFDINIR 300 MG PO CAPS: 300.0000 mg | ORAL_CAPSULE | Freq: Two times a day (BID) | ORAL | 0 refills | Status: AC

## 2017-10-24 MED ORDER — CEFDINIR 300 MG PO CAPS
300.0000 mg | ORAL_CAPSULE | Freq: Two times a day (BID) | ORAL | Status: DC
  Administered 2017-10-24: 300 mg via ORAL
  Filled 2017-10-24: qty 1

## 2017-10-24 MED ORDER — FLUTICASONE PROPIONATE 50 MCG/ACT NA SUSP: 2.0000 | Freq: Every day | NASAL | 0 refills | Status: DC

## 2017-10-24 MED ORDER — AZITHROMYCIN 250 MG PO TABS
500.0000 mg | ORAL_TABLET | Freq: Every day | ORAL | Status: DC
  Administered 2017-10-24: 500 mg via ORAL
  Filled 2017-10-24: qty 2

## 2017-10-24 MED ORDER — LORATADINE 10 MG PO TABS: 10.0000 mg | ORAL_TABLET | Freq: Every day | ORAL | 0 refills | Status: AC

## 2017-10-24 NOTE — Progress Notes (Signed)
Occupational Therapy Treatment Patient Details Name: KRISTOFOR HUYETT MRN: [id number] DOB: 1955/11/26 Today's Date: 10/24/2017    History of present illness 62 yo male admitted with acute respiratory failure, fatigue, weakness, dx Pna. Hx of CHF, syncope, CAD, back pain, OSA, obesity, CVA, LBBB   OT comments  Pt with increased I this day and feels ready to DC with his caregiver  Follow Up Recommendations  Supervision - Intermittent;Home health OT    Equipment Recommendations  None recommended by OT       Precautions / Restrictions Precautions Precautions: Fall Restrictions Weight Bearing Restrictions: No       Mobility Bed Mobility               General bed mobility comments: oob in recliner  Transfers Overall transfer level: Modified independent                    Balance Overall balance assessment: No apparent balance deficits (not formally assessed)                                         ADL either performed or assessed with clinical judgement   ADL Overall ADL's : Needs assistance/impaired     Grooming: Standing Grooming Details (indicate cue type and reason): stood at sink                  Toilet Transfer: Supervision/safety;Ambulation;Comfort height toilet   Toileting- Clothing Manipulation and Hygiene: Supervision/safety;Sit to/from stand       Functional mobility during ADLs: Supervision/safety;Cueing for safety;Cueing for sequencing       Vision Patient Visual Report: No change from baseline            Cognition Arousal/Alertness: Awake/alert Behavior During Therapy: WFL for tasks assessed/performed;Impulsive Overall Cognitive Status: Within Functional Limits for tasks assessed                                 General Comments: very talkative                   Pertinent Vitals/ Pain       Pain Assessment: No/denies pain         Frequency  Min 2X/week        Progress  Toward Goals  OT Goals(current goals can now be found in the care plan section)  Progress towards OT goals: Progressing toward goals     Plan         AM-PAC PT "6 Clicks" Daily Activity     Outcome Measure   Help from another person eating meals?: None Help from another person taking care of personal grooming?: None Help from another person toileting, which includes using toliet, bedpan, or urinal?: A Little Help from another person bathing (including washing, rinsing, drying)?: A Little Help from another person to put on and taking off regular upper body clothing?: None Help from another person to put on and taking off regular lower body clothing?: A Little 6 Click Score: 21    End of Session    OT Visit Diagnosis: Unsteadiness on feet (R26.81);Other abnormalities of gait and mobility (R26.89);History of falling (Z91.81);Muscle weakness (generalized) (M62.81)   Activity Tolerance Patient tolerated treatment well   Patient Left in chair;with call bell/phone within reach   Nurse Communication Mobility status  Time: 1245-1305 OT Time Calculation (min): 20 min  Charges: OT General Charges $OT Visit: 1 Visit OT Treatments $Self Care/Home Management : 8-22 mins  [place], [place] [phone]   Alba Cory 10/24/2017, 2:47 PM

## 2017-10-24 NOTE — Progress Notes (Signed)
Physical Therapy Treatment/Discharge Note Patient Details Name: Alejandro Ramos MRN: [id number] DOB: 12-19-1955 Today's Date: 10/24/2017    History of Present Illness 62 yo male admitted with acute respiratory failure, fatigue, weakness, dx Pna. Hx of CHF, syncope, CAD, back pain, OSA, obesity, CVA, LBBB    PT Comments    Pt has progressed well. Mod Ind with activity on today. Pt walked ~1000 feet without difficulty or an assistive device. No further PT needs. Will sign off and d/c pt from PT caseload.    Follow Up Recommendations  No PT follow up;Supervision - Intermittent     Equipment Recommendations  None recommended by PT    Recommendations for Other Services       Precautions / Restrictions Precautions Precautions: Fall Restrictions Weight Bearing Restrictions: No    Mobility  Bed Mobility               General bed mobility comments: oob in recliner  Transfers Overall transfer level: Modified independent                  Ambulation/Gait Ambulation/Gait assistance: Modified independent (Device/Increase time) Gait Distance (Feet): 1000 Feet Assistive device: None       General Gait Details: O2 sats = or > 90% on RA while ambulating. No LOB. No lightheadedness/dizziness.    Stairs             Wheelchair Mobility    Modified Rankin (Stroke Patients Only)       Balance Overall balance assessment: No apparent balance deficits (not formally assessed)                                          Cognition Arousal/Alertness: Awake/alert Behavior During Therapy: WFL for tasks assessed/performed Overall Cognitive Status: Within Functional Limits for tasks assessed                                 General Comments: very talkative      Exercises      General Comments        Pertinent Vitals/Pain Pain Assessment: No/denies pain    Home Living                      Prior Function             PT Goals (current goals can now be found in the care plan section) Progress towards PT goals: Goals met/education completed, patient discharged from PT    Frequency    Min 3X/week      PT Plan      Co-evaluation              AM-PAC PT "6 Clicks" Daily Activity  Outcome Measure  Difficulty turning over in bed (including adjusting bedclothes, sheets and blankets)?: None Difficulty moving from lying on back to sitting on the side of the bed? : None Difficulty sitting down on and standing up from a chair with arms (e.g., wheelchair, bedside commode, etc,.)?: None Help needed moving to and from a bed to chair (including a wheelchair)?: None Help needed walking in hospital room?: None Help needed climbing 3-5 steps with a railing? : A Little 6 Click Score: 23    End of Session   Activity Tolerance: Patient tolerated treatment well Patient left: in chair;with  call bell/phone within reach   PT Visit Diagnosis: Unsteadiness on feet (R26.81);Muscle weakness (generalized) (M62.81)     Time: 5320-2334 PT Time Calculation (min) (ACUTE ONLY): 24 min  Charges:  $Gait Training: 8-22 mins                    G Codes:          Alejandro Ramos, MPT Pager: [phone]
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
## 2017-10-24 NOTE — Progress Notes (Signed)
Patient discharge teaching given, including activity, diet, follow-up appoints, and medications. Patient verbalized understanding of all discharge instructions. IV access was d/c'd. Vitals are stable. Skin is intact except as charted in most recent assessments. Pt to be escorted out by NT, to be driven home by family. 

## 2017-10-26 LAB — CULTURE, BLOOD (ROUTINE X 2)
CULTURE: NO GROWTH
Culture: NO GROWTH
SPECIAL REQUESTS: ADEQUATE
SPECIAL REQUESTS: ADEQUATE

## 2017-10-31 DIAGNOSIS — F4312 Post-traumatic stress disorder, chronic: Secondary | ICD-10-CM | POA: Diagnosis not present

## 2017-10-31 DIAGNOSIS — F5101 Primary insomnia: Secondary | ICD-10-CM | POA: Diagnosis not present

## 2017-11-07 DIAGNOSIS — N401 Enlarged prostate with lower urinary tract symptoms: Secondary | ICD-10-CM | POA: Diagnosis not present

## 2017-11-07 DIAGNOSIS — N3943 Post-void dribbling: Secondary | ICD-10-CM | POA: Diagnosis not present

## 2017-11-08 DIAGNOSIS — F4312 Post-traumatic stress disorder, chronic: Secondary | ICD-10-CM | POA: Diagnosis not present

## 2017-11-08 DIAGNOSIS — F5101 Primary insomnia: Secondary | ICD-10-CM | POA: Diagnosis not present

## 2017-11-09 DIAGNOSIS — J309 Allergic rhinitis, unspecified: Secondary | ICD-10-CM | POA: Diagnosis not present

## 2017-11-09 DIAGNOSIS — G43909 Migraine, unspecified, not intractable, without status migrainosus: Secondary | ICD-10-CM | POA: Diagnosis not present

## 2017-11-09 DIAGNOSIS — J9691 Respiratory failure, unspecified with hypoxia: Secondary | ICD-10-CM | POA: Diagnosis not present

## 2017-11-09 DIAGNOSIS — J189 Pneumonia, unspecified organism: Secondary | ICD-10-CM | POA: Diagnosis not present

## 2017-11-10 ENCOUNTER — Telehealth: Payer: Self-pay | Admitting: Cardiovascular Disease

## 2017-11-10 NOTE — Telephone Encounter (Signed)
New Message:      Alejandro Ramos Physician is calling on behalf of the pt to schedule a hosp f/u with Dr. Salena Saner. She states the pt on wants to see Dr. Salena Saner and no APP. Please call pt to set up appt.

## 2017-11-10 NOTE — Telephone Encounter (Signed)
Lm to call back ./cy 

## 2017-11-11 ENCOUNTER — Other Ambulatory Visit: Payer: Self-pay | Admitting: Cardiovascular Disease

## 2017-11-13 ENCOUNTER — Ambulatory Visit: Payer: BLUE CROSS/BLUE SHIELD | Admitting: Cardiovascular Disease

## 2017-11-13 ENCOUNTER — Encounter: Payer: Self-pay | Admitting: Cardiovascular Disease

## 2017-11-13 VITALS — BP 110/68 | HR 83 | Ht 70.0 in | Wt 278.0 lb

## 2017-11-13 DIAGNOSIS — N281 Cyst of kidney, acquired: Secondary | ICD-10-CM | POA: Diagnosis not present

## 2017-11-13 DIAGNOSIS — N200 Calculus of kidney: Secondary | ICD-10-CM | POA: Diagnosis not present

## 2017-11-13 DIAGNOSIS — Z6835 Body mass index (BMI) 35.0-35.9, adult: Secondary | ICD-10-CM

## 2017-11-13 DIAGNOSIS — I2510 Atherosclerotic heart disease of native coronary artery without angina pectoris: Secondary | ICD-10-CM | POA: Diagnosis not present

## 2017-11-13 DIAGNOSIS — I5042 Chronic combined systolic (congestive) and diastolic (congestive) heart failure: Secondary | ICD-10-CM

## 2017-11-13 DIAGNOSIS — J9601 Acute respiratory failure with hypoxia: Secondary | ICD-10-CM

## 2017-11-13 DIAGNOSIS — R55 Syncope and collapse: Secondary | ICD-10-CM

## 2017-11-13 DIAGNOSIS — I447 Left bundle-branch block, unspecified: Secondary | ICD-10-CM

## 2017-11-13 NOTE — Progress Notes (Signed)
Cardiology consultation Note    Date:  11/14/2017   ID:  Alejandro Ramos, DOB [birth year], MRN [id number]  PCP:  Mila Palmer, MD  Cardiologist:   Thurmon Fair, MD  Reason for consultation: Syncope, multiple coronary risk factors Chief Complaint  Patient presents with  . Follow-up    Patient for pneumonia, hypoxia, syncope    History of Present Illness:  Alejandro Ramos is a 62 y.o. male who presents for Idiopathic dilated cardiomyopathy and congestive heart failure.  Alejandro Ramos was briefly hospitalized for recurrent syncope.  He went to [facility] for his son's graduation and had a syncopal event there.  He reports subsequently passing out 4 times during the plane trip back.  He was found to be hypoxic and chest CT showed evidence of a pneumonia.  He was treated with antibiotics and discharged without oxygen supplementation.  He has not had syncope since then.  He has not required a change in his diuretic dose.  An echocardiogram performed during the hospital day showed unchanged left ventricular systolic function with ejection fraction of 45%.  While on telemetry, no meaningful arrhythmia was seen.  He does not have edema and denies orthopnea or PND.  He remains morbidly obese with a BMI read at 40.  He is interested in a sustained program bariatric medicine to include counseling, dietary instruction, group support, etc., but is not sure that he wants to go ahead with bariatric surgery.  Most recent visit with Dr. Truett Perna at [facility] in was in April, when he was felt to be well compensated.  Further escalation of the doses of his heart failure medications was discussed, but the patient's did not want to change his medications.  He still feels the same way today.  Sleep study has been ordered.  Was seen in the sleep medicine clinic at [facility] by Dr. Raul Del, but it looks like that appointment did not go too well. He felt that she was too inexperienced to help him.  Hospitalized October 2018 for  Ambien withdrawal, after using doses as high as 10 times the maximum recommended dose. Now seeing Dr. Donell Beers and he is using Halcion under his supervision.  Last assessment of LVEF was by echo October 22, 2017 with ejection fraction of 55-60% and "grade 1 diastolic dysfunction" (note that fractional shortening was slightly decreased at 24%). LVEF by the cardiac MRI in December 2017: 46%.  In 2017, Left ventricular systolic function was estimated at 25-30% by echo and LV angiography, 36% by the nuclear test. No reversible ischemia was seen on the nuclear study (small fixed defect which was probably LBBB related artifact). No significant valvular abnormalities are seen. He underwent right and left heart catheterization on 01/12/2016. It showed minimal scattered coronary atherosclerosis and confirmed severely depressed left ventricular systolic function. He was hemodynamically compensated at the time with a mean PA wedge pressure of 14 mmHg and a cardiac index of 2.0 L/min/meter sq.  Last assessment of LVEF was by cardiac MRI in December 2017 which showed an improvement from 25-30% by echo and LV angiogram, up to 46% by MRI. The cardiac index was calculated at 2.2 L/minute/meter squared. Gadolinium enhancement showed no evidence of infiltrative abnormalities or any scar.  Past Medical History:  Diagnosis Date  . Back pain, chronic 12/03/2011  . CAD (coronary artery disease)   . CHF (congestive heart failure) (HCC)   . Chronic prostatitis   . 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 GERD (gastroesophageal  reflux disease)   . H/O: drug dependency (HCC)   . IBS (irritable bowel syndrome)   . Kidney stone   . LBBB (left bundle branch block)   . OSA (obstructive sleep apnea)   . Sleep apnea 12/03/2011   Has a C-PAP machine, but does not use it.  . 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 CARDIAC CATHETERIZATION N/A 01/12/2016     Procedure: Right/Left Heart Cath and Coronary Angiography;  Surgeon: Thurmon Fair, MD; RCA 15%, LM 25-30%, LVEDP 12 mm Hg, PWCP mean 14 mm Hg, PA 35/20, mean 26 mm Hg, CO 4.9 L/min, CI 2 L/min (sq)    Current Medications: Outpatient Medications Prior to Visit  Medication Sig Dispense Refill  . acetaminophen (TYLENOL) 500 MG tablet Take 1,000 mg by mouth every 6 (six) hours as needed for moderate pain or headache.    . alosetron (LOTRONEX) 0.5 MG tablet Take 0.5 mg by mouth 2 (two) times daily.  4  . bisoprolol (ZEBETA) 10 MG tablet Take 1 tablet (10 mg total) by mouth at bedtime. (Patient taking differently: Take 5 mg by mouth at bedtime. ) 30 tablet 11  . DULoxetine (CYMBALTA) 60 MG capsule TAKE 1 CAPSULE (60 MG TOTAL) BY MOUTH ONCE DAILY.  11  . ENTRESTO 24-26 MG TAKE 1 TABLET TWICE A DAY 180 tablet 3  . finasteride (PROSCAR) 5 MG tablet Take 5 mg by mouth daily.   4  . fluticasone (FLONASE) 50 MCG/ACT nasal spray Place 2 sprays into both nostrils daily. 16 g 0  . gabapentin (NEURONTIN) 600 MG tablet Take 600 mg by mouth 3 (three) times daily.     . hydrOXYzine (ATARAX/VISTARIL) 50 MG tablet TAKE 1-2 TABLETS BY MOUTH ONE HOUR BEFORE SLEEP  11  . ibuprofen (ADVIL,MOTRIN) 200 MG tablet Take 800 mg by mouth every 6 (six) hours as needed for moderate pain.    Marland Kitchen loratadine (CLARITIN) 10 MG tablet Take 1 tablet (10 mg total) by mouth daily. 30 tablet 0  . Multiple Vitamins-Minerals (MENS 50+ MULTI VITAMIN/MIN PO) Take 1 tablet by mouth daily.    Marland Kitchen omeprazole (PRILOSEC) 40 MG capsule TAKE ONE CAPSULE BY MOUTH TWICE A DAY 30 MINUTES BEFORE BREAKFAST AND DINNER  3  . spironolactone (ALDACTONE) 25 MG tablet TAKE 0.5 TABLETS (12.5 MG TOTAL) BY MOUTH ONCE DAILY. 15 tablet 6  . Testosterone (AXIRON) 30 MG/ACT SOLN PLACE (30 MG) ONTO THE SKIN DAILY    . tiZANidine (ZANAFLEX) 2 MG tablet Take 2 mg by mouth every 8 (eight) hours as needed for muscle spasms.   0  . triazolam (HALCION) 0.25 MG tablet Take  0.25 mg by mouth at bedtime.     No facility-administered medications prior to visit.      Allergies:   Patient has no known allergies.   Social History   Socioeconomic History  . Marital status: Divorced    Spouse name: Not on file  . Number of children: Not on file  . Years of education: Not on file  . Highest education level: Not on file  Occupational History  . Not on file  Social Needs  . Financial resource strain: Not on file  . Food insecurity:    Worry: Not on file    Inability: Not on file  . Transportation needs:    Medical: Not on file    Non-medical: Not on file  Tobacco Use  . Smoking status: Never Smoker  .  Smokeless tobacco: Never Used  Substance and Sexual Activity  . Alcohol use: No  . Drug use: No    Types: Hydrocodone, Other-see comments  . Sexual activity: Not Currently  Lifestyle  . Physical activity:    Days per week: Not on file    Minutes per session: Not on file  . Stress: Not on file  Relationships  . Social connections:    Talks on phone: Not on file    Gets together: Not on file    Attends religious service: Not on file    Active member of club or organization: Not on file    Attends meetings of clubs or organizations: Not on file    Relationship status: Not on file  Other Topics Concern  . Not on file  Social History Narrative  . Not on file     Family History:  The patient's family history includes Arrhythmia in his maternal grandfather and mother; Congestive Heart Failure in his father; Heart attack in his maternal grandmother; Heart attack (age of onset: 50) in his paternal grandfather; Heart attack (age of onset: 97) in his paternal grandmother; Paranoid behavior in his father.   ROS:   Please see the history of present illness.    ROS All other systems reviewed and are negative.   PHYSICAL EXAM:   VS:  BP 110/68   Pulse 83   Ht 5\' 10"  (1.778 m)   Wt 278 lb (126.1 kg)   SpO2 (!) 89%   BMI 39.89 kg/m      General:  Alert, oriented x3, no distress, morbidly obese Head: no evidence of trauma, PERRL, EOMI, no exophtalmos or lid lag, no myxedema, no xanthelasma; normal ears, nose and oropharynx Neck: normal jugular venous pulsations and no hepatojugular reflux; brisk carotid pulses without delay and no carotid bruits Chest: clear to auscultation, no signs of consolidation by percussion or palpation, normal fremitus, symmetrical and full respiratory excursions Cardiovascular: normal position and quality of the apical impulse, regular rhythm, normal first and paradoxically split second heart sounds, no murmurs, rubs or gallops Abdomen: no tenderness or distention, no masses by palpation, no abnormal pulsatility or arterial bruits, normal bowel sounds, no hepatosplenomegaly Extremities: no clubbing, cyanosis or edema; 2+ radial, ulnar and brachial pulses bilaterally; 2+ right femoral, posterior tibial and dorsalis pedis pulses; 2+ left femoral, posterior tibial and dorsalis pedis pulses; no subclavian or femoral bruits Neurological: grossly nonfocal Psych: Normal mood and affect easily distractible and jumps from one topic to another quickly   Wt Readings from Last 3 Encounters:  11/13/17 278 lb (126.1 kg)  10/24/17 275 lb 8 oz (125 kg)  06/19/17 259 lb 6.4 oz (117.7 kg)      Studies/Labs Reviewed:   EKG:  EKG is not ordered today.  ECG 10/23/2017: sinus rhythm with a LBBB (QRS duration 142 ms), QTc  470 ms  Recent Labs: 10/21/2017: B Natriuretic Peptide 13.4; Magnesium 2.0 10/22/2017: ALT 36; TSH 4.111 10/23/2017: Hemoglobin 16.1; Platelets 222 10/24/2017: BUN 22; Creatinine, Ser 0.90; Potassium 3.8; Sodium 140   Lipid Panel Total cholesterol 223, HDL 65, triglycerides 93, LDL 139 (01/22/2015), hemoglobin A1c 6%  Additional studies/ records that were reviewed today include:  Notes from Dr. Truett Perna in Care Everywhere April 15 and Dr. Logan Bores July 16   ASSESSMENT:    1. Chronic combined systolic and diastolic  heart failure (HCC)   2. Syncope, unspecified syncope type   3. Acute respiratory failure with hypoxia (HCC)   4. Coronary  artery disease involving native coronary artery of native heart without angina pectoris   5. LBBB (left bundle branch block)   6. Severe obesity (BMI 35.0-35.9 with comorbidity) (HCC)     PLAN:  In order of problems listed above:  1. CHF: Clinically euvolemic, NYHA functional class I-2.  As far as I can tell the events during his last hospitalization were not related to heart failure. He appears to have idiopathic dilated CMP, no diagnostic findings on cardiac MRI, no evidence of infiltrative disorder.  Surprising report of complete normalization of left ventricular systolic function on the last echo 2. Syncope: Its possible that he had hypoxic syncope, especially since multiple events occurred during [place].  However, since he does have underlying cardiomyopathy it would be wise to perform some type of arrhythmia monitoring.  He will be best served by an implantable loop recorder, but does not want to undergo that procedure at this time.  He promises to promptly report any future episodes of syncope or near syncope.  Be agreeable to an implantable loop recorder should the symptoms recur.   3. Hypoxia: He remains mildly hypoxic, he would like portable oxygen until he gets over the current illness. 4. CAD: Coronary lesions are minor and cannot explain his cardiomyopathy. He does not have angina pectoris. Target LDL<100, preferably less than 70.  He does not want to take lipid-lowering medication 5. LBBB: This is a chronic abnormality. If ventricular systolic function and clinical status deteriorates in the future despite medical therapy, he has multiple features predictive of good response to CRT-D. 6. Severe obesity: He seems generally interested in a sustained and coherent program of weight loss.  We will refer him to the bariatric program. 7. Reported  history of obstructive sleep apnea: repeatedly scheduled for evaluation, but never really confirmed since has canceled the tests ("how can I have sleep apnea if I never fall sleep").    Medication Adjustments/Labs and Tests Ordered: Current medicines are reviewed at length with the patient today.  Concerns regarding medicines are outlined above.  Medication changes, Labs and Tests ordered today are listed in the Patient Instructions below. Patient Instructions  You have been referred to Dr Quillian Quince at [facility]. They will call you to schedule an appointment. Their phone number is [phone] if you do not hear from them.  Dr Royann Shivers recommends that you schedule a follow-up appointment in 3 months.  If you need a refill on your cardiac medications before your next appointment, please call your pharmacy.    Signed, Thurmon Fair, MD  11/14/2017 11:42 AM    [facility] [address] [place], [place], [place]  [address] Phone: [phone]; Fax: [phone]

## 2017-11-13 NOTE — Patient Instructions (Signed)
You have been referred to Dr Quillian Quince at [facility]. They will call you to schedule an appointment. Their phone number is [phone] if you do not hear from them.  Dr Royann Shivers recommends that you schedule a follow-up appointment in 3 months.  If you need a refill on your cardiac medications before your next appointment, please call your pharmacy.

## 2017-11-14 DIAGNOSIS — F5101 Primary insomnia: Secondary | ICD-10-CM | POA: Diagnosis not present

## 2017-11-14 DIAGNOSIS — F4312 Post-traumatic stress disorder, chronic: Secondary | ICD-10-CM | POA: Diagnosis not present

## 2017-11-14 NOTE — Telephone Encounter (Signed)
Left message for pt to call.

## 2017-11-15 NOTE — Telephone Encounter (Signed)
Pt had an appt with Dr Royann Shivers on 11/13/17 .Zack Seal

## 2017-11-21 DIAGNOSIS — F4312 Post-traumatic stress disorder, chronic: Secondary | ICD-10-CM | POA: Diagnosis not present

## 2017-11-21 DIAGNOSIS — F5101 Primary insomnia: Secondary | ICD-10-CM | POA: Diagnosis not present

## 2017-11-27 DIAGNOSIS — K219 Gastro-esophageal reflux disease without esophagitis: Secondary | ICD-10-CM | POA: Diagnosis not present

## 2017-11-27 DIAGNOSIS — K580 Irritable bowel syndrome with diarrhea: Secondary | ICD-10-CM | POA: Diagnosis not present

## 2017-11-27 DIAGNOSIS — F309 Manic episode, unspecified: Secondary | ICD-10-CM | POA: Diagnosis not present

## 2017-11-28 DIAGNOSIS — F5101 Primary insomnia: Secondary | ICD-10-CM | POA: Diagnosis not present

## 2017-11-28 DIAGNOSIS — F4312 Post-traumatic stress disorder, chronic: Secondary | ICD-10-CM | POA: Diagnosis not present

## 2017-12-10 DIAGNOSIS — Z23 Encounter for immunization: Secondary | ICD-10-CM | POA: Diagnosis not present

## 2017-12-10 DIAGNOSIS — J029 Acute pharyngitis, unspecified: Secondary | ICD-10-CM | POA: Diagnosis not present

## 2017-12-10 DIAGNOSIS — J0100 Acute maxillary sinusitis, unspecified: Secondary | ICD-10-CM | POA: Diagnosis not present

## 2017-12-18 ENCOUNTER — Telehealth: Payer: Self-pay | Admitting: Cardiovascular Disease

## 2017-12-18 DIAGNOSIS — R197 Diarrhea, unspecified: Secondary | ICD-10-CM | POA: Diagnosis not present

## 2017-12-18 DIAGNOSIS — K580 Irritable bowel syndrome with diarrhea: Secondary | ICD-10-CM | POA: Diagnosis not present

## 2017-12-18 DIAGNOSIS — F3342 Major depressive disorder, recurrent, in full remission: Secondary | ICD-10-CM | POA: Diagnosis not present

## 2017-12-18 DIAGNOSIS — F309 Manic episode, unspecified: Secondary | ICD-10-CM | POA: Diagnosis not present

## 2017-12-18 NOTE — Telephone Encounter (Signed)
Received records from [facility] on 12/18/17, Appt 02/15/18 @ 3:00PM. [place]

## 2017-12-19 DIAGNOSIS — R197 Diarrhea, unspecified: Secondary | ICD-10-CM | POA: Diagnosis not present

## 2017-12-19 DIAGNOSIS — K580 Irritable bowel syndrome with diarrhea: Secondary | ICD-10-CM | POA: Diagnosis not present

## 2017-12-19 DIAGNOSIS — F309 Manic episode, unspecified: Secondary | ICD-10-CM | POA: Diagnosis not present

## 2017-12-26 DIAGNOSIS — F4312 Post-traumatic stress disorder, chronic: Secondary | ICD-10-CM | POA: Diagnosis not present

## 2017-12-26 DIAGNOSIS — F5101 Primary insomnia: Secondary | ICD-10-CM | POA: Diagnosis not present

## 2018-01-02 DIAGNOSIS — F5101 Primary insomnia: Secondary | ICD-10-CM | POA: Diagnosis not present

## 2018-01-02 DIAGNOSIS — F4312 Post-traumatic stress disorder, chronic: Secondary | ICD-10-CM | POA: Diagnosis not present

## 2018-01-13 ENCOUNTER — Other Ambulatory Visit: Payer: Self-pay | Admitting: Cardiovascular Disease

## 2018-01-15 DIAGNOSIS — N201 Calculus of ureter: Secondary | ICD-10-CM | POA: Diagnosis not present

## 2018-01-15 DIAGNOSIS — I509 Heart failure, unspecified: Secondary | ICD-10-CM | POA: Diagnosis not present

## 2018-01-15 DIAGNOSIS — F329 Major depressive disorder, single episode, unspecified: Secondary | ICD-10-CM | POA: Diagnosis not present

## 2018-01-15 DIAGNOSIS — K219 Gastro-esophageal reflux disease without esophagitis: Secondary | ICD-10-CM | POA: Diagnosis not present

## 2018-01-15 DIAGNOSIS — N200 Calculus of kidney: Secondary | ICD-10-CM | POA: Diagnosis not present

## 2018-01-22 DIAGNOSIS — I5022 Chronic systolic (congestive) heart failure: Secondary | ICD-10-CM | POA: Diagnosis not present

## 2018-01-22 DIAGNOSIS — R0609 Other forms of dyspnea: Secondary | ICD-10-CM | POA: Diagnosis not present

## 2018-01-30 DIAGNOSIS — F4312 Post-traumatic stress disorder, chronic: Secondary | ICD-10-CM | POA: Diagnosis not present

## 2018-01-30 DIAGNOSIS — F5101 Primary insomnia: Secondary | ICD-10-CM | POA: Diagnosis not present

## 2018-02-06 DIAGNOSIS — N201 Calculus of ureter: Secondary | ICD-10-CM | POA: Diagnosis not present

## 2018-02-06 DIAGNOSIS — R1011 Right upper quadrant pain: Secondary | ICD-10-CM | POA: Diagnosis not present

## 2018-02-06 DIAGNOSIS — Z87442 Personal history of urinary calculi: Secondary | ICD-10-CM | POA: Diagnosis not present

## 2018-02-06 DIAGNOSIS — R972 Elevated prostate specific antigen [PSA]: Secondary | ICD-10-CM | POA: Diagnosis not present

## 2018-02-06 DIAGNOSIS — E291 Testicular hypofunction: Secondary | ICD-10-CM | POA: Diagnosis not present

## 2018-02-06 DIAGNOSIS — Z466 Encounter for fitting and adjustment of urinary device: Secondary | ICD-10-CM | POA: Diagnosis not present

## 2018-02-13 DIAGNOSIS — F5101 Primary insomnia: Secondary | ICD-10-CM | POA: Diagnosis not present

## 2018-02-13 DIAGNOSIS — F4312 Post-traumatic stress disorder, chronic: Secondary | ICD-10-CM | POA: Diagnosis not present

## 2018-02-15 ENCOUNTER — Ambulatory Visit: Payer: BLUE CROSS/BLUE SHIELD | Admitting: Cardiovascular Disease

## 2018-02-20 DIAGNOSIS — F4312 Post-traumatic stress disorder, chronic: Secondary | ICD-10-CM | POA: Diagnosis not present

## 2018-02-20 DIAGNOSIS — F5101 Primary insomnia: Secondary | ICD-10-CM | POA: Diagnosis not present

## 2018-02-26 DIAGNOSIS — F3342 Major depressive disorder, recurrent, in full remission: Secondary | ICD-10-CM | POA: Diagnosis not present

## 2018-02-27 DIAGNOSIS — F5101 Primary insomnia: Secondary | ICD-10-CM | POA: Diagnosis not present

## 2018-02-27 DIAGNOSIS — F4312 Post-traumatic stress disorder, chronic: Secondary | ICD-10-CM | POA: Diagnosis not present

## 2018-03-07 DIAGNOSIS — Z0000 Encounter for general adult medical examination without abnormal findings: Secondary | ICD-10-CM | POA: Diagnosis not present

## 2018-03-07 DIAGNOSIS — Z8619 Personal history of other infectious and parasitic diseases: Secondary | ICD-10-CM | POA: Diagnosis not present

## 2018-03-07 DIAGNOSIS — I509 Heart failure, unspecified: Secondary | ICD-10-CM | POA: Diagnosis not present

## 2018-03-07 DIAGNOSIS — I10 Essential (primary) hypertension: Secondary | ICD-10-CM | POA: Diagnosis not present

## 2018-03-07 DIAGNOSIS — A6920 Lyme disease, unspecified: Secondary | ICD-10-CM | POA: Diagnosis not present

## 2018-03-07 DIAGNOSIS — G479 Sleep disorder, unspecified: Secondary | ICD-10-CM | POA: Diagnosis not present

## 2018-03-09 DIAGNOSIS — G479 Sleep disorder, unspecified: Secondary | ICD-10-CM | POA: Diagnosis not present

## 2018-03-13 DIAGNOSIS — F4312 Post-traumatic stress disorder, chronic: Secondary | ICD-10-CM | POA: Diagnosis not present

## 2018-03-13 DIAGNOSIS — F5101 Primary insomnia: Secondary | ICD-10-CM | POA: Diagnosis not present

## 2018-04-03 DIAGNOSIS — F5101 Primary insomnia: Secondary | ICD-10-CM | POA: Diagnosis not present

## 2018-04-03 DIAGNOSIS — F4312 Post-traumatic stress disorder, chronic: Secondary | ICD-10-CM | POA: Diagnosis not present

## 2018-04-05 DIAGNOSIS — N401 Enlarged prostate with lower urinary tract symptoms: Secondary | ICD-10-CM | POA: Diagnosis not present

## 2018-04-05 DIAGNOSIS — R972 Elevated prostate specific antigen [PSA]: Secondary | ICD-10-CM | POA: Diagnosis not present

## 2018-04-05 DIAGNOSIS — N411 Chronic prostatitis: Secondary | ICD-10-CM | POA: Diagnosis not present

## 2018-04-05 DIAGNOSIS — E291 Testicular hypofunction: Secondary | ICD-10-CM | POA: Diagnosis not present

## 2018-04-10 DIAGNOSIS — F5101 Primary insomnia: Secondary | ICD-10-CM | POA: Diagnosis not present

## 2018-04-10 DIAGNOSIS — F4312 Post-traumatic stress disorder, chronic: Secondary | ICD-10-CM | POA: Diagnosis not present

## 2018-04-15 DIAGNOSIS — R110 Nausea: Secondary | ICD-10-CM | POA: Diagnosis not present

## 2018-04-19 DIAGNOSIS — J0100 Acute maxillary sinusitis, unspecified: Secondary | ICD-10-CM | POA: Diagnosis not present

## 2018-05-03 DIAGNOSIS — F5101 Primary insomnia: Secondary | ICD-10-CM | POA: Diagnosis not present

## 2018-05-03 DIAGNOSIS — F4312 Post-traumatic stress disorder, chronic: Secondary | ICD-10-CM | POA: Diagnosis not present

## 2018-05-04 ENCOUNTER — Ambulatory Visit: Payer: BLUE CROSS/BLUE SHIELD | Admitting: Cardiovascular Disease

## 2018-05-04 ENCOUNTER — Encounter: Payer: Self-pay | Admitting: Cardiovascular Disease

## 2018-05-04 VITALS — BP 122/72 | HR 71 | Ht 71.0 in | Wt 272.4 lb

## 2018-05-04 DIAGNOSIS — I5042 Chronic combined systolic (congestive) and diastolic (congestive) heart failure: Secondary | ICD-10-CM | POA: Diagnosis not present

## 2018-05-04 DIAGNOSIS — Z87898 Personal history of other specified conditions: Secondary | ICD-10-CM

## 2018-05-04 DIAGNOSIS — I428 Other cardiomyopathies: Secondary | ICD-10-CM | POA: Diagnosis not present

## 2018-05-04 DIAGNOSIS — Z6835 Body mass index (BMI) 35.0-35.9, adult: Secondary | ICD-10-CM

## 2018-05-04 DIAGNOSIS — Z79899 Other long term (current) drug therapy: Secondary | ICD-10-CM

## 2018-05-04 DIAGNOSIS — R3914 Feeling of incomplete bladder emptying: Secondary | ICD-10-CM

## 2018-05-04 DIAGNOSIS — N401 Enlarged prostate with lower urinary tract symptoms: Secondary | ICD-10-CM

## 2018-05-04 DIAGNOSIS — Z01810 Encounter for preprocedural cardiovascular examination: Secondary | ICD-10-CM

## 2018-05-04 DIAGNOSIS — I2510 Atherosclerotic heart disease of native coronary artery without angina pectoris: Secondary | ICD-10-CM

## 2018-05-04 DIAGNOSIS — I447 Left bundle-branch block, unspecified: Secondary | ICD-10-CM

## 2018-05-04 MED ORDER — SACUBITRIL-VALSARTAN 49-51 MG PO TABS: 1.0000 | ORAL_TABLET | Freq: Two times a day (BID) | ORAL | 3 refills | Status: DC

## 2018-05-04 NOTE — Progress Notes (Signed)
Cardiology consultation Note    Date:  05/05/2018   ID:  Alejandro SaucierJohn D Baby, DOB 09-10-1955, MRN [id number]  PCP:  Mila PalmerWolters, Sharon, MD  Cardiologist:   Thurmon FairMihai Tamara Kenyon, MD  Reason for consultation: Syncope, multiple coronary risk factors Chief Complaint  Patient presents with  . Congestive Heart Failure    History of Present Illness:  Alejandro Ramos is a 63 y.o. male who presents for Idiopathic dilated cardiomyopathy and congestive heart failure.  He has been well compensated and has not required hospitalization or treatment with diuretics.  He last saw Dr. Truett [facility]Mentz at [facility] in September.  He suggested titration of his heart failure medications, but Jonny RuizJohn was reluctant.  He seems more open to that suggestion now.  He has not had any recurrent problems with syncope.  He denies orthopnea, PND, edema, chest pain, palpitations.  He continues to struggle with unsuccessful attempts at weight loss and is considering bariatric surgery more more strongly.  He actually has lost some weight 7 his BMI is now 38, no longer in the morbidly obese range.  He has issues with hesitancy and very slow urinary stream and has scheduled [facility]P at [facility] on January 24.  He reports he has not had an erection for 2 years and this is due to medications.  He also bears a diagnosis of hypogonadism.  He went to [place] specialty clinic in [place] hoping to get some help with his sleep initiation disorder but was unhappy with their services.  Hospitalized October 2018 for Ambien withdrawal, after using doses as high as 10 times the maximum recommended dose. Now seeing Dr. Donell BeersPlovsky and he is using Halcion under his supervision.  Last assessment of LVEF was by echo October 22, 2017 with ejection fraction of 55-60% and "grade 1 diastolic dysfunction" (note that fractional shortening was slightly decreased at 24% and in my opinion the EF is still around 45-50%).  LVEF by the cardiac MRI in December 2017: 46%.  In 2017, Left  ventricular systolic function was estimated at 25-30% by echo and LV angiography, 36% by the nuclear test. No reversible ischemia was seen on the nuclear study (small fixed defect which was probably LBBB related artifact). No significant valvular abnormalities are seen. He underwent right and left heart catheterization on 01/12/2016. It showed minimal scattered coronary atherosclerosis and confirmed severely depressed left ventricular systolic function. He was hemodynamically compensated at the time with a mean PA wedge pressure of 14 mmHg and a cardiac index of 2.0 L/min/meter sq.  Cardiac MRI in December 2017 showed improvement to 46%, cardiac index was calculated at 2.2 L/minute/meter squared. Gadolinium enhancement showed no evidence of infiltrative abnormalities or any scar.  Past Medical History:  Diagnosis Date  . Back pain, chronic 12/03/2011  . CAD (coronary artery disease)   . CHF (congestive heart failure) (HCC)   . Chronic prostatitis   . 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 GERD (gastroesophageal reflux disease)   . H/O: drug dependency (HCC)   . IBS (irritable bowel syndrome)   . Kidney stone   . LBBB (left bundle branch block)   . OSA (obstructive sleep apnea)   . Sleep apnea 12/03/2011   Has a C-PAP machine, but does not use it.  . 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 CARDIAC CATHETERIZATION N/A 01/12/2016   Procedure: Right/Left Heart Cath and Coronary Angiography;  Surgeon: Thurmon FairMihai Kasen Adduci, MD; RCA 15%, LM 25-30%,  LVEDP 12 mm Hg, PWCP mean 14 mm Hg, PA 35/20, mean 26 mm Hg, CO 4.9 L/min, CI 2 L/min (sq)    Current Medications: Outpatient Medications Prior to Visit  Medication Sig Dispense Refill  . acetaminophen (TYLENOL) 500 MG tablet Take 1,000 mg by mouth every 6 (six) hours as needed for moderate pain or headache.    . alosetron (LOTRONEX) 0.5 MG tablet Take 0.5 mg by mouth 2 (two)  times daily.  4  . bisoprolol (ZEBETA) 10 MG tablet Take 1 tablet (10 mg total) by mouth at bedtime. (Patient taking differently: Take 5 mg by mouth at bedtime. ) 30 tablet 11  . DULoxetine (CYMBALTA) 60 MG capsule TAKE 1 CAPSULE (60 MG TOTAL) BY MOUTH ONCE DAILY.  11  . finasteride (PROSCAR) 5 MG tablet Take 5 mg by mouth daily.   4  . fluticasone (FLONASE) 50 MCG/ACT nasal spray Place 2 sprays into both nostrils daily. 16 g 0  . gabapentin (NEURONTIN) 600 MG tablet Take 600 mg by mouth 3 (three) times daily.     Marland Kitchen ibuprofen (ADVIL,MOTRIN) 200 MG tablet Take 800 mg by mouth every 6 (six) hours as needed for moderate pain.    Marland Kitchen loratadine (CLARITIN) 10 MG tablet Take 1 tablet (10 mg total) by mouth daily. 30 tablet 0  . Multiple Vitamins-Minerals (MENS 50+ MULTI VITAMIN/MIN PO) Take 1 tablet by mouth daily.    Marland Kitchen spironolactone (ALDACTONE) 25 MG tablet TAKE 0.5 TABLETS (12.5 MG TOTAL) BY MOUTH ONCE DAILY. 45 tablet 8  . tiZANidine (ZANAFLEX) 2 MG tablet Take 2 mg by mouth every 8 (eight) hours as needed for muscle spasms.   0  . triazolam (HALCION) 0.25 MG tablet Take 0.25 mg by mouth at bedtime.    Marland Kitchen ENTRESTO 24-26 MG TAKE 1 TABLET TWICE A DAY 180 tablet 3  . hydrOXYzine (ATARAX/VISTARIL) 50 MG tablet TAKE 1-2 TABLETS BY MOUTH ONE HOUR BEFORE SLEEP  11  . omeprazole (PRILOSEC) 40 MG capsule TAKE ONE CAPSULE BY MOUTH TWICE A DAY 30 MINUTES BEFORE BREAKFAST AND DINNER  3  . Testosterone (AXIRON) 30 MG/ACT SOLN PLACE (30 MG) ONTO THE SKIN DAILY     No facility-administered medications prior to visit.      Allergies:   Patient has no known allergies.   Social History   Socioeconomic History  . Marital status: Divorced    Spouse name: Not on file  . Number of children: Not on file  . Years of education: Not on file  . Highest education level: Not on file  Occupational History  . Not on file  Social Needs  . Financial resource strain: Not on file  . Food insecurity:    Worry: Not on file     Inability: Not on file  . Transportation needs:    Medical: Not on file    Non-medical: Not on file  Tobacco Use  . Smoking status: Never Smoker  . Smokeless tobacco: Never Used  Substance and Sexual Activity  . Alcohol use: No  . Drug use: No    Types: Hydrocodone, Other-see comments  . Sexual activity: Not Currently  Lifestyle  . Physical activity:    Days per week: Not on file    Minutes per session: Not on file  . Stress: Not on file  Relationships  . Social connections:    Talks on phone: Not on file    Gets together: Not on file    Attends religious service: Not on  file    Active member of club or organization: Not on file    Attends meetings of clubs or organizations: Not on file    Relationship status: Not on file  Other Topics Concern  . Not on file  Social History Narrative  . Not on file     Family History:  The patient's family history includes Arrhythmia in his maternal grandfather and mother; Congestive Heart Failure in his father; Heart attack in his maternal grandmother; Heart attack (age of onset: 79) in his paternal grandfather; Heart attack (age of onset: 49) in his paternal grandmother; Paranoid behavior in his father.   ROS:   Please see the history of present illness.    ROS all other systems are reviewed and are negative   PHYSICAL EXAM:   VS:  BP 122/72   Pulse 71   Ht 5\' 11"  (1.803 m)   Wt 272 lb 6.4 oz (123.6 kg)   BMI 37.99 kg/m      General: Alert, oriented x3, no distress, severely obese Head: no evidence of trauma, PERRL, EOMI, no exophtalmos or lid lag, no myxedema, no xanthelasma; normal ears, nose and oropharynx Neck: normal jugular venous pulsations and no hepatojugular reflux; brisk carotid pulses without delay and no carotid bruits Chest: clear to auscultation, no signs of consolidation by percussion or palpation, normal fremitus, symmetrical and full respiratory excursions Cardiovascular: normal position and quality of the  apical impulse, regular rhythm, normal first and paradoxically split second heart sounds, no murmurs, rubs or gallops Abdomen: no tenderness or distention, no masses by palpation, no abnormal pulsatility or arterial bruits, normal bowel sounds, no hepatosplenomegaly Extremities: no clubbing, cyanosis or edema; 2+ radial, ulnar and brachial pulses bilaterally; 2+ right femoral, posterior tibial and dorsalis pedis pulses; 2+ left femoral, posterior tibial and dorsalis pedis pulses; no subclavian or femoral bruits Neurological: grossly nonfocal Psych: Normal mood and affect    Wt Readings from Last 3 Encounters:  05/04/18 272 lb 6.4 oz (123.6 kg)  11/13/17 278 lb (126.1 kg)  10/24/17 275 lb 8 oz (125 kg)      Studies/Labs Reviewed:   EKG:  EKG is ordered today and it shows normal sinus rhythm with left bundle branch block.  The QRS measures 156 ms, QTC 462 ms.  Recent Labs: 10/21/2017: B Natriuretic Peptide 13.4; Magnesium 2.0 10/22/2017: ALT 36; TSH 4.111 10/23/2017: Hemoglobin 16.1; Platelets 222 10/24/2017: BUN 22; Creatinine, Ser 0.90; Potassium 3.8; Sodium 140   Lipid Panel Total cholesterol 223, HDL 65, triglycerides 93, LDL 139 (01/22/2015), hemoglobin A1c 6%  Additional studies/ records that were reviewed today include:  Notes in Care Everywhere from Dr. Truett Perna Sept 30 and Dr. Logan Bores Apr 05 2018  ASSESSMENT:    1. Chronic combined systolic and diastolic heart failure (HCC)   2. Nonischemic cardiomyopathy (HCC)   3. Medication management   4. History of syncope   5. Atherosclerosis of native coronary artery of native heart without angina pectoris   6. LBBB (left bundle branch block)   7. Benign prostatic hyperplasia with incomplete bladder emptying   8. Preoperative cardiovascular examination   9. Severe obesity (BMI 35.0-35.9 with comorbidity) (HCC)   10. Long term prescription benzodiazepine use     PLAN:  In order of problems listed above:  1. CHF: Euvolemic, NYHA  functional class I-2.  Discussed the rationale for trying to uptitrate his medications and with the use higher dose of Entresto first (carvedilol and spironolactone may be worsening some of his  noncardiac issues).  He should have repeat labs in a week or 2 and those can be synchronized with his hospitalization at [facility].   2. NIDCMP: He appears to have idiopathic dilated CMP, no diagnostic findings on cardiac MRI, no evidence of infiltrative disorder.   3. Syncope: Its possible that he had hypoxic syncope, especially since multiple events occurred during [place].  No recurrence since resolution of his pneumonia. 4. CAD: Coronary lesions are minor and cannot explain his cardiomyopathy. He does not have angina pectoris. Target LDL<100, preferably less than 70.  He does not want to take lipid-lowering medication 5. LBBB: This is a chronic abnormality. If ventricular systolic function and clinical status deteriorates in the future despite medical therapy, he has multiple features predictive of good response to CRT-D (nonischemic cardiomyopathy, typical LBBB, QRS greater than 150 ms). 6. BPH/CPPS: Scheduled for TURP January 24.   7. Preop risk eval: His cardiac problems are well compensated and I think he is at low risk for major cardiovascular complications with the planned surgery.  It is important that his cardiac medications in particular carvedilol is not interrupted in the perioperative period. 8. Severe obesity: He seems inclined to undergo a gastric sleeve procedure. 9. Reported history of obstructive sleep apnea: repeatedly scheduled for evaluation, but never really confirmed since has canceled the tests ("how can I have sleep apnea if I never fall sleep").  Weight loss would be highly beneficial. 10. Habitual use of sleep aids    Medication Adjustments/Labs and Tests Ordered: Current medicines are reviewed at length with the patient today.  Concerns regarding medicines are  outlined above.  Medication changes, Labs and Tests ordered today are listed in the Patient Instructions below. Patient Instructions  Medication Instructions:  Dr Royann Shiversroitoru has recommended making the following medication changes: 1. INCREASE Entresto to 49-51 mg twice daily  If you need a refill on your cardiac medications before your next appointment, please call your pharmacy.   Lab work: Your physician recommends that you return for lab work in 3 weeks.  If you have labs (blood work) drawn today and your tests are completely normal, you will receive your results only by: Marland Kitchen. MyChart Message (if you have MyChart) OR . A paper copy in the mail If you have any lab test that is abnormal or we need to change your treatment, we will call you to review the results.  Follow-Up: At [facility], you and your health needs are our priority.  As part of our continuing mission to provide you with exceptional heart care, we have created designated Provider Care Teams.  These Care Teams include your primary Cardiologist (physician) and Advanced Practice Providers (APPs -  Physician Assistants and Nurse Practitioners) who all work together to provide you with the care you need, when you need it. You will need a follow up appointment in 6 months. You may see Thurmon FairMihai Blanton Kardell, MD or one of the following Advanced Practice Providers on your designated Care Team: [place], [place] . Micah FlesherAngela Duke, PA-C . You will receive a reminder letter in the mail two months in advance. If you don't receive a letter, please call our office to schedule the follow-up appointment.    Signed, Thurmon FairMihai Syann Cupples, MD  05/05/2018 11:01 AM    [facility] [address], [place], [place]  [address] Phone: [phone]; Fax: [phone]

## 2018-05-04 NOTE — Patient Instructions (Signed)
Medication Instructions:  Dr Royann Shivers has recommended making the following medication changes: 1. INCREASE Entresto to 49-51 mg twice daily  If you need a refill on your cardiac medications before your next appointment, please call your pharmacy.   Lab work: Your physician recommends that you return for lab work in 3 weeks.  If you have labs (blood work) drawn today and your tests are completely normal, you will receive your results only by: Marland Kitchen MyChart Message (if you have MyChart) OR . A paper copy in the mail If you have any lab test that is abnormal or we need to change your treatment, we will call you to review the results.  Follow-Up: At [facility], you and your health needs are our priority.  As part of our continuing mission to provide you with exceptional heart care, we have created designated Provider Care Teams.  These Care Teams include your primary Cardiologist (physician) and Advanced Practice Providers (APPs -  Physician Assistants and Nurse Practitioners) who all work together to provide you with the care you need, when you need it. You will need a follow up appointment in 6 months. You may see Thurmon Fair, MD or one of the following Advanced Practice Providers on your designated Care Team: [place], [place] . Micah Flesher, PA-C . You will receive a reminder letter in the mail two months in advance. If you don't receive a letter, please call our office to schedule the follow-up appointment.

## 2018-05-05 ENCOUNTER — Encounter: Payer: Self-pay | Admitting: Cardiovascular Disease

## 2018-05-15 DIAGNOSIS — F4312 Post-traumatic stress disorder, chronic: Secondary | ICD-10-CM | POA: Diagnosis not present

## 2018-05-15 DIAGNOSIS — F5101 Primary insomnia: Secondary | ICD-10-CM | POA: Diagnosis not present

## 2018-05-16 DIAGNOSIS — K580 Irritable bowel syndrome with diarrhea: Secondary | ICD-10-CM | POA: Diagnosis not present

## 2018-05-16 DIAGNOSIS — E6609 Other obesity due to excess calories: Secondary | ICD-10-CM | POA: Diagnosis not present

## 2018-05-18 DIAGNOSIS — N401 Enlarged prostate with lower urinary tract symptoms: Secondary | ICD-10-CM | POA: Diagnosis not present

## 2018-05-18 DIAGNOSIS — R338 Other retention of urine: Secondary | ICD-10-CM | POA: Diagnosis not present

## 2018-05-18 DIAGNOSIS — N411 Chronic prostatitis: Secondary | ICD-10-CM | POA: Diagnosis not present

## 2018-05-18 DIAGNOSIS — R339 Retention of urine, unspecified: Secondary | ICD-10-CM | POA: Diagnosis not present

## 2018-05-18 DIAGNOSIS — N138 Other obstructive and reflux uropathy: Secondary | ICD-10-CM | POA: Diagnosis not present

## 2018-05-19 DIAGNOSIS — R339 Retention of urine, unspecified: Secondary | ICD-10-CM | POA: Diagnosis not present

## 2018-05-19 DIAGNOSIS — N401 Enlarged prostate with lower urinary tract symptoms: Secondary | ICD-10-CM | POA: Diagnosis not present

## 2018-05-24 DIAGNOSIS — R339 Retention of urine, unspecified: Secondary | ICD-10-CM | POA: Diagnosis not present

## 2018-05-24 DIAGNOSIS — Z466 Encounter for fitting and adjustment of urinary device: Secondary | ICD-10-CM | POA: Diagnosis not present

## 2018-05-29 DIAGNOSIS — F3342 Major depressive disorder, recurrent, in full remission: Secondary | ICD-10-CM | POA: Diagnosis not present

## 2018-06-05 DIAGNOSIS — F3342 Major depressive disorder, recurrent, in full remission: Secondary | ICD-10-CM | POA: Diagnosis not present

## 2018-06-05 DIAGNOSIS — F5101 Primary insomnia: Secondary | ICD-10-CM | POA: Diagnosis not present

## 2018-06-05 DIAGNOSIS — F4312 Post-traumatic stress disorder, chronic: Secondary | ICD-10-CM | POA: Diagnosis not present

## 2018-06-12 DIAGNOSIS — F4312 Post-traumatic stress disorder, chronic: Secondary | ICD-10-CM | POA: Diagnosis not present

## 2018-06-12 DIAGNOSIS — F5101 Primary insomnia: Secondary | ICD-10-CM | POA: Diagnosis not present

## 2018-06-15 DIAGNOSIS — M791 Myalgia, unspecified site: Secondary | ICD-10-CM | POA: Diagnosis not present

## 2018-06-15 DIAGNOSIS — J111 Influenza due to unidentified influenza virus with other respiratory manifestations: Secondary | ICD-10-CM | POA: Diagnosis not present

## 2018-06-19 DIAGNOSIS — F5101 Primary insomnia: Secondary | ICD-10-CM | POA: Diagnosis not present

## 2018-06-19 DIAGNOSIS — F4312 Post-traumatic stress disorder, chronic: Secondary | ICD-10-CM | POA: Diagnosis not present

## 2018-06-26 DIAGNOSIS — F411 Generalized anxiety disorder: Secondary | ICD-10-CM | POA: Diagnosis not present

## 2018-06-26 DIAGNOSIS — F438 Other reactions to severe stress: Secondary | ICD-10-CM | POA: Diagnosis not present

## 2018-06-30 ENCOUNTER — Other Ambulatory Visit: Payer: Self-pay | Admitting: Cardiovascular Disease

## 2018-07-17 DIAGNOSIS — F5101 Primary insomnia: Secondary | ICD-10-CM | POA: Diagnosis not present

## 2018-07-18 DIAGNOSIS — F411 Generalized anxiety disorder: Secondary | ICD-10-CM | POA: Diagnosis not present

## 2018-07-18 DIAGNOSIS — F438 Other reactions to severe stress: Secondary | ICD-10-CM | POA: Diagnosis not present

## 2018-07-23 ENCOUNTER — Telehealth: Payer: Self-pay | Admitting: Cardiovascular Disease

## 2018-07-23 MED ORDER — BISOPROLOL FUMARATE 10 MG PO TABS: 10.0000 mg | ORAL_TABLET | Freq: Every day | ORAL | 11 refills | Status: DC

## 2018-07-23 NOTE — Telephone Encounter (Signed)
Rx request from [facility] requesting refill for Bisoprolol 10 mg QD. Refills sent for 30 tab with 11 refills.

## 2018-07-24 DIAGNOSIS — F438 Other reactions to severe stress: Secondary | ICD-10-CM | POA: Diagnosis not present

## 2018-07-24 DIAGNOSIS — F411 Generalized anxiety disorder: Secondary | ICD-10-CM | POA: Diagnosis not present

## 2018-07-27 DIAGNOSIS — F411 Generalized anxiety disorder: Secondary | ICD-10-CM | POA: Diagnosis not present

## 2018-07-27 DIAGNOSIS — F438 Other reactions to severe stress: Secondary | ICD-10-CM | POA: Diagnosis not present

## 2018-07-31 DIAGNOSIS — F411 Generalized anxiety disorder: Secondary | ICD-10-CM | POA: Diagnosis not present

## 2018-07-31 DIAGNOSIS — F438 Other reactions to severe stress: Secondary | ICD-10-CM | POA: Diagnosis not present

## 2018-08-03 DIAGNOSIS — F438 Other reactions to severe stress: Secondary | ICD-10-CM | POA: Diagnosis not present

## 2018-08-03 DIAGNOSIS — F411 Generalized anxiety disorder: Secondary | ICD-10-CM | POA: Diagnosis not present

## 2018-08-07 DIAGNOSIS — F438 Other reactions to severe stress: Secondary | ICD-10-CM | POA: Diagnosis not present

## 2018-08-07 DIAGNOSIS — F411 Generalized anxiety disorder: Secondary | ICD-10-CM | POA: Diagnosis not present

## 2018-08-08 ENCOUNTER — Telehealth: Payer: Self-pay | Admitting: Cardiovascular Disease

## 2018-08-08 NOTE — Telephone Encounter (Signed)
New message:    Patient calling concerning a appt.

## 2018-08-09 NOTE — Telephone Encounter (Signed)
Lm to call back ./cy 

## 2018-08-14 DIAGNOSIS — F438 Other reactions to severe stress: Secondary | ICD-10-CM | POA: Diagnosis not present

## 2018-08-14 DIAGNOSIS — F411 Generalized anxiety disorder: Secondary | ICD-10-CM | POA: Diagnosis not present

## 2018-08-20 DIAGNOSIS — I5022 Chronic systolic (congestive) heart failure: Secondary | ICD-10-CM | POA: Diagnosis not present

## 2018-08-20 DIAGNOSIS — R0600 Dyspnea, unspecified: Secondary | ICD-10-CM | POA: Diagnosis not present

## 2018-08-21 DIAGNOSIS — F411 Generalized anxiety disorder: Secondary | ICD-10-CM | POA: Diagnosis not present

## 2018-08-21 DIAGNOSIS — F438 Other reactions to severe stress: Secondary | ICD-10-CM | POA: Diagnosis not present

## 2018-08-27 DIAGNOSIS — F3342 Major depressive disorder, recurrent, in full remission: Secondary | ICD-10-CM | POA: Diagnosis not present

## 2018-08-28 DIAGNOSIS — F438 Other reactions to severe stress: Secondary | ICD-10-CM | POA: Diagnosis not present

## 2018-08-28 DIAGNOSIS — F411 Generalized anxiety disorder: Secondary | ICD-10-CM | POA: Diagnosis not present

## 2018-09-04 DIAGNOSIS — F411 Generalized anxiety disorder: Secondary | ICD-10-CM | POA: Diagnosis not present

## 2018-09-04 DIAGNOSIS — F438 Other reactions to severe stress: Secondary | ICD-10-CM | POA: Diagnosis not present

## 2018-09-06 DIAGNOSIS — N411 Chronic prostatitis: Secondary | ICD-10-CM | POA: Diagnosis not present

## 2018-09-11 DIAGNOSIS — F411 Generalized anxiety disorder: Secondary | ICD-10-CM | POA: Diagnosis not present

## 2018-09-11 DIAGNOSIS — F438 Other reactions to severe stress: Secondary | ICD-10-CM | POA: Diagnosis not present

## 2018-09-18 DIAGNOSIS — F411 Generalized anxiety disorder: Secondary | ICD-10-CM | POA: Diagnosis not present

## 2018-09-18 DIAGNOSIS — F438 Other reactions to severe stress: Secondary | ICD-10-CM | POA: Diagnosis not present

## 2018-09-24 DIAGNOSIS — D2372 Other benign neoplasm of skin of left lower limb, including hip: Secondary | ICD-10-CM | POA: Diagnosis not present

## 2018-09-24 DIAGNOSIS — L821 Other seborrheic keratosis: Secondary | ICD-10-CM | POA: Diagnosis not present

## 2018-09-24 DIAGNOSIS — D1801 Hemangioma of skin and subcutaneous tissue: Secondary | ICD-10-CM | POA: Diagnosis not present

## 2018-09-24 DIAGNOSIS — L918 Other hypertrophic disorders of the skin: Secondary | ICD-10-CM | POA: Diagnosis not present

## 2018-09-25 DIAGNOSIS — F411 Generalized anxiety disorder: Secondary | ICD-10-CM | POA: Diagnosis not present

## 2018-09-25 DIAGNOSIS — F438 Other reactions to severe stress: Secondary | ICD-10-CM | POA: Diagnosis not present

## 2018-10-02 DIAGNOSIS — F438 Other reactions to severe stress: Secondary | ICD-10-CM | POA: Diagnosis not present

## 2018-10-02 DIAGNOSIS — F411 Generalized anxiety disorder: Secondary | ICD-10-CM | POA: Diagnosis not present

## 2018-10-09 DIAGNOSIS — F411 Generalized anxiety disorder: Secondary | ICD-10-CM | POA: Diagnosis not present

## 2018-10-09 DIAGNOSIS — F438 Other reactions to severe stress: Secondary | ICD-10-CM | POA: Diagnosis not present

## 2018-10-16 DIAGNOSIS — F411 Generalized anxiety disorder: Secondary | ICD-10-CM | POA: Diagnosis not present

## 2018-10-16 DIAGNOSIS — F438 Other reactions to severe stress: Secondary | ICD-10-CM | POA: Diagnosis not present

## 2018-10-23 DIAGNOSIS — F411 Generalized anxiety disorder: Secondary | ICD-10-CM | POA: Diagnosis not present

## 2018-10-23 DIAGNOSIS — F438 Other reactions to severe stress: Secondary | ICD-10-CM | POA: Diagnosis not present

## 2018-10-30 DIAGNOSIS — F411 Generalized anxiety disorder: Secondary | ICD-10-CM | POA: Diagnosis not present

## 2018-10-30 DIAGNOSIS — F438 Other reactions to severe stress: Secondary | ICD-10-CM | POA: Diagnosis not present

## 2018-11-01 ENCOUNTER — Telehealth: Payer: Self-pay | Admitting: *Deleted

## 2018-11-01 NOTE — Telephone Encounter (Signed)
Left a message for the patient to call back concerning his appointment for tomorrow, whether he would like a virtual visit or office visit. He will need consent or prescreening.

## 2018-11-02 ENCOUNTER — Encounter: Payer: Self-pay | Admitting: Cardiovascular Disease

## 2018-11-02 ENCOUNTER — Telehealth (INDEPENDENT_AMBULATORY_CARE_PROVIDER_SITE_OTHER): Payer: BLUE CROSS/BLUE SHIELD | Admitting: Cardiovascular Disease

## 2018-11-02 VITALS — BP 116/80 | HR 65 | Temp 96.6°F | Ht 70.0 in | Wt 245.0 lb

## 2018-11-02 DIAGNOSIS — E782 Mixed hyperlipidemia: Secondary | ICD-10-CM | POA: Diagnosis not present

## 2018-11-02 DIAGNOSIS — G4733 Obstructive sleep apnea (adult) (pediatric): Secondary | ICD-10-CM

## 2018-11-02 DIAGNOSIS — I5042 Chronic combined systolic (congestive) and diastolic (congestive) heart failure: Secondary | ICD-10-CM

## 2018-11-02 DIAGNOSIS — I428 Other cardiomyopathies: Secondary | ICD-10-CM | POA: Diagnosis not present

## 2018-11-02 DIAGNOSIS — I2510 Atherosclerotic heart disease of native coronary artery without angina pectoris: Secondary | ICD-10-CM

## 2018-11-02 DIAGNOSIS — Z6835 Body mass index (BMI) 35.0-35.9, adult: Secondary | ICD-10-CM

## 2018-11-02 DIAGNOSIS — I447 Left bundle-branch block, unspecified: Secondary | ICD-10-CM

## 2018-11-02 NOTE — Progress Notes (Signed)
Virtual Visit via Video Note   This visit type was conducted due to national recommendations for restrictions regarding the COVID-19 Pandemic (e.g. social distancing) in an effort to limit this patient's exposure and mitigate transmission in our community.  Due to his co-morbid illnesses, this patient is at least at moderate risk for complications without adequate follow up.  This format is felt to be most appropriate for this patient at this time.  All issues noted in this document were discussed and addressed.  A limited physical exam was performed with this format.  Please refer to the patient's chart for his consent to telehealth for [facility].   Date:  11/02/2018   ID:  SWAN HANDZEL, DOB 1955/08/03, MRN [id number]  Patient Location: Home Provider Location: Office  PCP:  Mila Palmer, MD  Cardiologist:  Thurmon Fair, MD  Electrophysiologist:  None   Evaluation Performed:  Follow-Up Visit  Chief Complaint:  CHF  History of Present Illness:    Alejandro Ramos is a 63 y.o. male with nonischemic cardiomyopathy, well compensated combined systolic and diastolic heart failure, left bundle branch block, obesity, sleep onset disorder, here for routine follow-up.  Alejandro Ramos is actually doing remarkably well.  He is walking at least 1 hours daily and has lost substantial weight.  He feels stronger and healthier.  Even his sleep is a little bit better.  He specifically denies orthopnea, PND, leg edema, angina, syncope, palpitations, dizziness/lightheadedness, focal neurological events or intermittent claudication.  Typically his systolic blood pressure runs in the 100-110 range.  He had a telemedicine visit with Dr. Truett Perna on April 27 and no changes were made to his medications.  Prostate surgery earlier this year has led to substantial improvement in his urinary complaints.  His son is graduating from high school and will go on to [employer].  He is reluctant to travel  to his graduation ceremony in [place], [place] because of the coronavirus.  The patient does not have symptoms concerning for COVID-19 infection (fever, chills, cough, or new shortness of breath).    Past Medical History:  Diagnosis Date  . Back pain, chronic 12/03/2011  . CAD (coronary artery disease)   . CHF (congestive heart failure) (HCC)   . Chronic prostatitis   . 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 GERD (gastroesophageal reflux disease)   . H/O: drug dependency (HCC)   . IBS (irritable bowel syndrome)   . Kidney stone   . LBBB (left bundle branch block)   . OSA (obstructive sleep apnea)   . Sleep apnea 12/03/2011   Has a C-PAP machine, but does not use it.  . 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 CARDIAC CATHETERIZATION N/A 01/12/2016   Procedure: Right/Left Heart Cath and Coronary Angiography;  Surgeon: Thurmon Fair, MD; RCA 15%, LM 25-30%, LVEDP 12 mm Hg, PWCP mean 14 mm Hg, PA 35/20, mean 26 mm Hg, CO 4.9 L/min, CI 2 L/min (sq)     Current Meds  Medication Sig  . acetaminophen (TYLENOL) 500 MG tablet Take 1,000 mg by mouth every 6 (six) hours as needed for moderate pain or headache.  . alosetron (LOTRONEX) 0.5 MG tablet Take 0.5 mg by mouth 2 (two) times daily.  . bisoprolol (ZEBETA) 10 MG tablet Take by mouth daily. Pt takes 5 mg daily at bedtime  . dexlansoprazole (DEXILANT) 60 MG capsule Take 60 mg by mouth daily.  . DULoxetine (CYMBALTA) 60  MG capsule TAKE 1 CAPSULE (60 MG TOTAL) BY MOUTH ONCE DAILY.  . finasteride (PROSCAR) 5 MG tablet Take 5 mg by mouth daily.   . fluticasone (FLONASE) 50 MCG/ACT nasal spray Place 2 sprays into both nostrils daily.  Marland Kitchen gabapentin (NEURONTIN) 600 MG tablet Take 600 mg by mouth 3 (three) times daily.   Marland Kitchen ibuprofen (ADVIL,MOTRIN) 200 MG tablet Take 800 mg by mouth every 6 (six) hours as needed for moderate pain.  Marland Kitchen loratadine (CLARITIN) 10 MG  tablet Take 1 tablet (10 mg total) by mouth daily.  . Multiple Vitamins-Minerals (MENS 50+ MULTI VITAMIN/MIN PO) Take 1 tablet by mouth daily.  . ramelteon (ROZEREM) 8 MG tablet Take 8 mg by mouth at bedtime.  . sacubitril-valsartan (ENTRESTO) 49-51 MG Take 1 tablet by mouth 2 (two) times daily.  Marland Kitchen spironolactone (ALDACTONE) 25 MG tablet TAKE 0.5 TABLETS (12.5 MG TOTAL) BY MOUTH ONCE DAILY.  Marland Kitchen tiZANidine (ZANAFLEX) 2 MG tablet Take 2 mg by mouth every 8 (eight) hours as needed for muscle spasms.   . triazolam (HALCION) 0.25 MG tablet Take 0.25 mg by mouth at bedtime.     Allergies:   Patient has no known allergies.   Social History   Tobacco Use  . Smoking status: Never Smoker  . Smokeless tobacco: Never Used  Substance Use Topics  . Alcohol use: No  . Drug use: No    Types: Hydrocodone, Other-see comments     Family Hx: The patient's family history includes Arrhythmia in his maternal grandfather and mother; Congestive Heart Failure in his father; Heart attack in his maternal grandmother; Heart attack (age of onset: 2) in his paternal grandfather; Heart attack (age of onset: 68) in his paternal grandmother; Paranoid behavior in his father.  ROS:   Please see the history of present illness.     All other systems reviewed and are negative.   Prior CV studies:   The following studies were reviewed today:  Notes from Dr. Rudene Christians  Labs/Other Tests and Data Reviewed:    EKG:  An ECG dated May 04, 2018 was personally reviewed today and demonstrated:  Sinus rhythm, left bundle branch block  Recent Labs: No results found for requested labs within last 8760 hours.   Recent Lipid Panel Lab Results  Component Value Date/Time   CHOL 181 04/28/2016 09:37 AM   TRIG 92 04/28/2016 09:37 AM   HDL 58 04/28/2016 09:37 AM   CHOLHDL 3.1 04/28/2016 09:37 AM   LDLCALC 105 (H) 04/28/2016 09:37 AM    Wt Readings from Last 3 Encounters:  11/02/18 245 lb (111.1 kg)  05/04/18 272 lb 6.4  oz (123.6 kg)  11/13/17 278 lb (126.1 kg)     Objective:    Vital Signs:  BP 116/80   Pulse 65   Temp (!) 96.6 F (35.9 C)   Ht 5\' 10"  (1.778 m)   Wt 245 lb (111.1 kg)   BMI 35.15 kg/m    VITAL SIGNS:  reviewed GEN:  no acute distress EYES:  sclerae anicteric, EOMI - Extraocular Movements Intact RESPIRATORY:  normal respiratory effort, symmetric expansion CARDIOVASCULAR:  no peripheral edema SKIN:  no rash, lesions or ulcers. MUSCULOSKELETAL:  no obvious deformities. NEURO:  alert and oriented x 3, no obvious focal deficit PSYCH:  normal affect  ASSESSMENT & PLAN:    1. CHF: As far as I can tell with the limitations of a video visit, needed euvolemic.  Appears to have NYHA functional class I.  Systolic blood  pressure is relatively low and he is reluctant to increase his heart failure meds.  He is on bisoprolol, Entresto and spironolactone in moderate doses.  He does not require loop diuretics.  2. CMP: Nonischemic.   3. LBBB: if his heart failure should worsen or ejection fraction drop, there is a good option for CRT.  4. Obesity: He has made excellent progress with weight loss.  Encouraged him to continue physical exercise and healthy diet. 5. CAD: He did have minor atherosclerotic lesions on angiography.  The focus is on risk factor modification.  He does not have angina pectoris.  6. HLP: Goal LDL less than 70.  Has not been rechecked recently.  He is losing weight and numbers may have improved.  He's always had a very good HDL.   COVID-19 Education: The signs and symptoms of COVID-19 were discussed with the patient and how to seek care for testing (follow up with PCP or arrange E-visit).  The importance of social distancing was discussed today.  Time:   Today, I have spent 17 minutes with the patient with telehealth technology discussing the above problems.     Medication Adjustments/Labs and Tests Ordered: Current medicines are reviewed at length with the patient  today.  Concerns regarding medicines are outlined above.   Tests Ordered: No orders of the defined types were placed in this encounter.   Medication Changes: No orders of the defined types were placed in this encounter.   Follow Up:  Virtual Visit or In Person 6 months  Signed, Thurmon FairMihai Sacramento Monds, MD  11/02/2018 3:11 PM    [facility]

## 2018-11-02 NOTE — Patient Instructions (Signed)
Medication Instructions:  Your physician recommends that you continue on your current medications as directed. Please refer to the Current Medication list given to you today.  If you need a refill on your cardiac medications before your next appointment, please call your pharmacy.   Lab work: None ordered  Testing/Procedures: None ordered  Follow-Up: At [facility], you and your health needs are our priority.  As part of our continuing mission to provide you with exceptional heart care, we have created designated Provider Care Teams.  These Care Teams include your primary Cardiologist (physician) and Advanced Practice Providers (APPs -  Physician Assistants and Nurse Practitioners) who all work together to provide you with the care you need, when you need it. You will need a follow up appointment in 6 months.  Please call our office 2 months in advance to schedule this appointment.  You may see Sanda Klein, MD or one of the following Advanced Practice Providers on your designated Care Team: Almyra Deforest, PA-C Peggye Fothergill

## 2018-11-06 DIAGNOSIS — F411 Generalized anxiety disorder: Secondary | ICD-10-CM | POA: Diagnosis not present

## 2018-11-06 DIAGNOSIS — F438 Other reactions to severe stress: Secondary | ICD-10-CM | POA: Diagnosis not present

## 2018-11-14 DIAGNOSIS — F411 Generalized anxiety disorder: Secondary | ICD-10-CM | POA: Diagnosis not present

## 2018-11-14 DIAGNOSIS — F438 Other reactions to severe stress: Secondary | ICD-10-CM | POA: Diagnosis not present

## 2018-11-20 DIAGNOSIS — F438 Other reactions to severe stress: Secondary | ICD-10-CM | POA: Diagnosis not present

## 2018-11-20 DIAGNOSIS — F411 Generalized anxiety disorder: Secondary | ICD-10-CM | POA: Diagnosis not present

## 2018-11-27 DIAGNOSIS — F411 Generalized anxiety disorder: Secondary | ICD-10-CM | POA: Diagnosis not present

## 2018-11-27 DIAGNOSIS — F438 Other reactions to severe stress: Secondary | ICD-10-CM | POA: Diagnosis not present

## 2018-12-04 DIAGNOSIS — F438 Other reactions to severe stress: Secondary | ICD-10-CM | POA: Diagnosis not present

## 2018-12-04 DIAGNOSIS — F411 Generalized anxiety disorder: Secondary | ICD-10-CM | POA: Diagnosis not present

## 2018-12-10 DIAGNOSIS — I5022 Chronic systolic (congestive) heart failure: Secondary | ICD-10-CM | POA: Diagnosis not present

## 2018-12-10 DIAGNOSIS — R0600 Dyspnea, unspecified: Secondary | ICD-10-CM | POA: Diagnosis not present

## 2018-12-12 DIAGNOSIS — F438 Other reactions to severe stress: Secondary | ICD-10-CM | POA: Diagnosis not present

## 2018-12-12 DIAGNOSIS — F411 Generalized anxiety disorder: Secondary | ICD-10-CM | POA: Diagnosis not present

## 2018-12-18 DIAGNOSIS — F411 Generalized anxiety disorder: Secondary | ICD-10-CM | POA: Diagnosis not present

## 2018-12-18 DIAGNOSIS — F438 Other reactions to severe stress: Secondary | ICD-10-CM | POA: Diagnosis not present

## 2018-12-25 DIAGNOSIS — F411 Generalized anxiety disorder: Secondary | ICD-10-CM | POA: Diagnosis not present

## 2018-12-25 DIAGNOSIS — F438 Other reactions to severe stress: Secondary | ICD-10-CM | POA: Diagnosis not present

## 2018-12-27 DIAGNOSIS — J309 Allergic rhinitis, unspecified: Secondary | ICD-10-CM | POA: Diagnosis not present

## 2018-12-27 DIAGNOSIS — G43909 Migraine, unspecified, not intractable, without status migrainosus: Secondary | ICD-10-CM | POA: Diagnosis not present

## 2019-01-01 DIAGNOSIS — F411 Generalized anxiety disorder: Secondary | ICD-10-CM | POA: Diagnosis not present

## 2019-01-01 DIAGNOSIS — F438 Other reactions to severe stress: Secondary | ICD-10-CM | POA: Diagnosis not present

## 2019-01-08 DIAGNOSIS — F438 Other reactions to severe stress: Secondary | ICD-10-CM | POA: Diagnosis not present

## 2019-01-08 DIAGNOSIS — F411 Generalized anxiety disorder: Secondary | ICD-10-CM | POA: Diagnosis not present

## 2019-01-15 DIAGNOSIS — F438 Other reactions to severe stress: Secondary | ICD-10-CM | POA: Diagnosis not present

## 2019-01-15 DIAGNOSIS — F411 Generalized anxiety disorder: Secondary | ICD-10-CM | POA: Diagnosis not present

## 2019-01-22 DIAGNOSIS — F438 Other reactions to severe stress: Secondary | ICD-10-CM | POA: Diagnosis not present

## 2019-01-22 DIAGNOSIS — F411 Generalized anxiety disorder: Secondary | ICD-10-CM | POA: Diagnosis not present

## 2019-01-24 DIAGNOSIS — F3342 Major depressive disorder, recurrent, in full remission: Secondary | ICD-10-CM | POA: Diagnosis not present

## 2019-01-29 DIAGNOSIS — F438 Other reactions to severe stress: Secondary | ICD-10-CM | POA: Diagnosis not present

## 2019-01-29 DIAGNOSIS — F411 Generalized anxiety disorder: Secondary | ICD-10-CM | POA: Diagnosis not present

## 2019-02-05 DIAGNOSIS — F411 Generalized anxiety disorder: Secondary | ICD-10-CM | POA: Diagnosis not present

## 2019-02-05 DIAGNOSIS — F438 Other reactions to severe stress: Secondary | ICD-10-CM | POA: Diagnosis not present

## 2019-02-12 DIAGNOSIS — F438 Other reactions to severe stress: Secondary | ICD-10-CM | POA: Diagnosis not present

## 2019-02-12 DIAGNOSIS — F411 Generalized anxiety disorder: Secondary | ICD-10-CM | POA: Diagnosis not present

## 2019-02-19 DIAGNOSIS — F411 Generalized anxiety disorder: Secondary | ICD-10-CM | POA: Diagnosis not present

## 2019-02-19 DIAGNOSIS — F438 Other reactions to severe stress: Secondary | ICD-10-CM | POA: Diagnosis not present

## 2019-02-23 ENCOUNTER — Emergency Department (HOSPITAL_BASED_OUTPATIENT_CLINIC_OR_DEPARTMENT_OTHER)
Admission: EM | Admit: 2019-02-23 | Discharge: 2019-02-23 | Disposition: A | Discharge status: Home / Self Care | Payer: BC Managed Care – PPO | Attending: Emergency Medicine | Admitting: Emergency Medicine

## 2019-02-23 ENCOUNTER — Other Ambulatory Visit: Payer: Self-pay

## 2019-02-23 ENCOUNTER — Encounter (HOSPITAL_BASED_OUTPATIENT_CLINIC_OR_DEPARTMENT_OTHER): Payer: Self-pay | Admitting: Emergency Medicine

## 2019-02-23 ENCOUNTER — Emergency Department (HOSPITAL_BASED_OUTPATIENT_CLINIC_OR_DEPARTMENT_OTHER): Payer: BC Managed Care – PPO

## 2019-02-23 DIAGNOSIS — M1612 Unilateral primary osteoarthritis, left hip: Secondary | ICD-10-CM | POA: Diagnosis not present

## 2019-02-23 DIAGNOSIS — M5432 Sciatica, left side: Secondary | ICD-10-CM | POA: Diagnosis not present

## 2019-02-23 DIAGNOSIS — K5900 Constipation, unspecified: Secondary | ICD-10-CM | POA: Diagnosis not present

## 2019-02-23 DIAGNOSIS — I2510 Atherosclerotic heart disease of native coronary artery without angina pectoris: Secondary | ICD-10-CM | POA: Insufficient documentation

## 2019-02-23 DIAGNOSIS — Z79899 Other long term (current) drug therapy: Secondary | ICD-10-CM | POA: Insufficient documentation

## 2019-02-23 DIAGNOSIS — I509 Heart failure, unspecified: Secondary | ICD-10-CM | POA: Diagnosis not present

## 2019-02-23 DIAGNOSIS — M79605 Pain in left leg: Secondary | ICD-10-CM | POA: Diagnosis not present

## 2019-02-23 MED ORDER — KETOROLAC TROMETHAMINE 60 MG/2ML IM SOLN
30.0000 mg | Freq: Once | INTRAMUSCULAR | Status: AC
  Administered 2019-02-23: 30 mg via INTRAMUSCULAR
  Filled 2019-02-23: qty 2

## 2019-02-23 MED ORDER — DICLOFENAC SODIUM ER 100 MG PO TB24: 100.0000 mg | ORAL_TABLET | Freq: Every day | ORAL | 0 refills | Status: DC

## 2019-02-23 MED ORDER — POLYETHYLENE GLYCOL 3350 17 GM/SCOOP PO POWD: 17.0000 g | Freq: Every day | ORAL | 0 refills | Status: DC

## 2019-02-23 NOTE — ED Triage Notes (Signed)
Left leg pain since Tuesday with no injury

## 2019-02-23 NOTE — ED Provider Notes (Signed)
[facility] EMERGENCY DEPARTMENT Provider Note   CSN: [id number] Arrival date & time: 02/23/19  0401     History   Chief Complaint Chief Complaint  Patient presents with  . Leg Pain    HPI Alejandro Ramos is a 63 y.o. male.     The history is provided by the patient.  Leg Pain Location:  Hip Time since incident:  5 days Injury: no   Hip location:  L hip Pain details:    Quality:  Aching   Radiates to:  L leg   Severity:  Severe   Onset quality:  Gradual   Timing:  Constant   Progression:  Unchanged Chronicity:  New Dislocation: no   Foreign body present:  No foreign bodies Prior injury to area:  No Relieved by:  Nothing Worsened by:  Nothing Ineffective treatments:  Acetaminophen (narcotic pain medication and lidocaine.  ) Associated symptoms: no back pain, no decreased ROM, no fatigue, no fever, no itching, no muscle weakness, no neck pain, no numbness, no stiffness, no swelling and no tingling   Associated symptoms comment:  Left buttock pain that radiated to the l hip and to the left knee Risk factors: obesity   Risk factors: no concern for non-accidental trauma   Patient with chronic back pain presents with left buttock pain that radiates to the left knee.  No weakness no numbness.  No f/c/r.  No trauma nor wounds.  Home medications including vicodin are not working.    Past Medical History:  Diagnosis Date  . Back pain, chronic 12/03/2011  . CAD (coronary artery disease)   . CHF (congestive heart failure) (HCC)   . Chronic prostatitis   . 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 GERD (gastroesophageal reflux disease)   . H/O: drug dependency (HCC)   . IBS (irritable bowel syndrome)   . Kidney stone   . LBBB (left bundle branch block)   . OSA (obstructive sleep apnea)   . Sleep apnea 12/03/2011   Has a C-PAP machine, but does not use it.  . TMJ syndrome 12/03/2011    Patient Active Problem List   Diagnosis Date Noted  . Acute respiratory failure with hypoxia (HCC) 10/21/2017  . CAP (community acquired pneumonia) 10/21/2017  . Ambien use disorder, severe (HCC) 01/31/2017  . Withdrawal from sedative drug (HCC) 01/31/2017  . Ambien use disorder, severe, dependence (HCC) 01/31/2017  . Chronic insomnia 01/25/2016  . Chronic combined systolic and diastolic heart failure (HCC) 01/06/2016  . Nonischemic cardiomyopathy (HCC) 01/06/2016  . LBBB (left bundle branch block) 01/06/2016  . Pre-diabetes 01/06/2016  . CAD (coronary artery disease) 12/10/2015  . Mixed hyperlipidemia 12/10/2015  . Severe obesity (BMI 35.0-35.9 with comorbidity) (HCC) 12/10/2015  . OSA (obstructive sleep apnea) 12/10/2015  . Syncope 12/10/2015  . Anxiety 03/07/2012  . Alcohol dependence (HCC) 02/28/2012  . Back pain, chronic 12/03/2011    Past Surgical History:  Procedure Laterality Date  . CARDIAC CATHETERIZATION N/A 01/12/2016   Procedure: Right/Left Heart Cath and Coronary Angiography;  Surgeon: Thurmon Fair, MD; RCA 15%, LM 25-30%, LVEDP 12 mm Hg, PWCP mean 14 mm Hg, PA 35/20, mean 26 mm Hg, CO 4.9 L/min, CI 2 L/min (sq)        Home Medications    Prior to Admission medications   Medication Sig Start Date End Date Taking? Authorizing Provider  acetaminophen (TYLENOL) 500 MG tablet Take 1,000 mg by mouth every 6 (six) hours as needed  for moderate pain or headache.    [provider]  alosetron (LOTRONEX) 0.5 MG tablet Take 0.5 mg by mouth 2 (two) times daily. 09/13/17   [provider]  bisoprolol (ZEBETA) 10 MG tablet Take by mouth daily. Pt takes 5 mg daily at bedtime    [provider]  dexlansoprazole (DEXILANT) 60 MG capsule Take 60 mg by mouth daily.    [provider]  DULoxetine (CYMBALTA) 60 MG capsule TAKE 1 CAPSULE (60 MG TOTAL) BY MOUTH ONCE DAILY. 01/22/17   [provider]  finasteride (PROSCAR) 5 MG tablet Take 5 mg by mouth daily.  01/18/16    [provider]  fluticasone (FLONASE) 50 MCG/ACT nasal spray Place 2 sprays into both nostrils daily. 10/25/17   Rodolph Bonghompson, Daniel V, MD  gabapentin (NEURONTIN) 600 MG tablet Take 600 mg by mouth 3 (three) times daily.     [provider]  ibuprofen (ADVIL,MOTRIN) 200 MG tablet Take 800 mg by mouth every 6 (six) hours as needed for moderate pain.    [provider]  loratadine (CLARITIN) 10 MG tablet Take 1 tablet (10 mg total) by mouth daily. 10/25/17   Rodolph Bonghompson, Daniel V, MD  Multiple Vitamins-Minerals (MENS 50+ MULTI VITAMIN/MIN PO) Take 1 tablet by mouth daily.    [provider]  ramelteon (ROZEREM) 8 MG tablet Take 8 mg by mouth at bedtime.    [provider]  sacubitril-valsartan (ENTRESTO) 49-51 MG Take 1 tablet by mouth 2 (two) times daily. 05/04/18   Croitoru, Mihai, MD  spironolactone (ALDACTONE) 25 MG tablet TAKE 0.5 TABLETS (12.5 MG TOTAL) BY MOUTH ONCE DAILY. 01/15/18   Croitoru, Mihai, MD  tiZANidine (ZANAFLEX) 2 MG tablet Take 2 mg by mouth every 8 (eight) hours as needed for muscle spasms.  01/26/17   [provider]  triazolam (HALCION) 0.25 MG tablet Take 0.25 mg by mouth at bedtime.    [provider]    Family History Family History  Problem Relation Age of Onset  . Paranoid behavior Father   . Congestive Heart Failure Father   . Arrhythmia Mother        has a PPM  . Heart attack Maternal Grandmother        2380s  . Arrhythmia Maternal Grandfather        5980s  . Heart attack Paternal Grandmother [phone]  . Heart attack Paternal Grandfather [phone]    Social History Social History   Tobacco Use  . Smoking status: Never Smoker  . Smokeless tobacco: Never Used  Substance Use Topics  . Alcohol use: No  . Drug use: No    Types: Hydrocodone, Other-see comments     Allergies   Patient has no known allergies.   Review of Systems Review of Systems  Constitutional: Negative for fatigue and fever.  HENT: Negative for  congestion.   Eyes: Negative for visual disturbance.  Respiratory: Negative for shortness of breath.   Cardiovascular: Negative for chest pain.  Gastrointestinal: Negative for abdominal pain.  Genitourinary: Negative for difficulty urinating.  Musculoskeletal: Positive for arthralgias. Negative for back pain, gait problem, joint swelling, neck pain and stiffness.  Skin: Negative for itching.  Neurological: Negative for weakness and numbness.  Psychiatric/Behavioral: Negative for agitation.  All other systems reviewed and are negative.    Physical Exam Updated Vital Signs BP 103/73 (BP Location: Left Arm)   Pulse 75   Temp 97.9 F (36.6 C) (Oral)   Resp 18   SpO2 95%  Physical Exam Vitals signs and nursing note reviewed.  Constitutional:      General: He is not in acute distress.    Appearance: Normal appearance.  HENT:     Head: Normocephalic and atraumatic.     Nose: Nose normal.  Eyes:     Pupils: Pupils are equal, round, and reactive to light.  Neck:     Musculoskeletal: Normal range of motion and neck supple.  Cardiovascular:     Rate and Rhythm: Normal rate and regular rhythm.     Pulses: Normal pulses.     Heart sounds: Normal heart sounds.  Pulmonary:     Effort: Pulmonary effort is normal.     Breath sounds: Normal breath sounds.  Abdominal:     General: Abdomen is flat. Bowel sounds are normal.     Tenderness: There is no abdominal tenderness.  Musculoskeletal: Normal range of motion.        General: No swelling, tenderness, deformity or signs of injury.     Left hip: Normal.     Left knee: Normal.     Lumbar back: Normal.     Left upper leg: Normal.     Right lower leg: No edema.     Left lower leg: No edema.  Skin:    General: Skin is warm and dry.     Capillary Refill: Capillary refill takes less than 2 seconds.  Neurological:     General: No focal deficit present.     Mental Status: He is alert and oriented to person, place, and time.     Deep  Tendon Reflexes: Reflexes normal.  Psychiatric:        Mood and Affect: Mood normal.        Behavior: Behavior normal.      ED Treatments / Results  Labs (all labs ordered are listed, but only abnormal results are displayed) Labs Reviewed - No data to display  EKG None  Radiology No results found.  Procedures Procedures (including critical care time)  Medications Ordered in ED Medications  ketorolac (TORADOL) injection 30 mg (30 mg Intramuscular Given 02/23/19 0502)     Initial Impression / Assessment and Plan / ED Course   Symptoms are consistent with sciatica.  Will add an NSAID to patient's current regimen.  Patient was concerned about hip fracture and this has been ruled out using Xray.  Clinically this was not possible as the patient is ambulatory on the leg.  Patient was informed of arthritis in the hip joint and constipation seen on XRay.  Have prescribed miralax for same and will refer patient to sports medicine.    Alejandro Ramos was evaluated in Emergency Department on 02/23/2019 for the symptoms described in the history of present illness. He was evaluated in the context of the global COVID-19 pandemic, which necessitated consideration that the patient might be at risk for infection with the SARS-CoV-2 virus that causes COVID-19. Institutional protocols and algorithms that pertain to the evaluation of patients at risk for COVID-19 are in a state of rapid change based on information released by regulatory bodies including the CDC and federal and state organizations. These policies and algorithms were followed during the patient's care in the ED.  Final Clinical Impressions(s) / ED Diagnoses   Return for weakness, numbness, changes in vision or speech, fevers >100.4 unrelieved by medication, shortness of breath, intractable vomiting, or diarrhea, abdominal pain, Inability to tolerate liquids or food, cough, altered mental status or any concerns. No signs  of systemic  illness or infection. The patient is nontoxic-appearing on exam and vital signs are within normal limits.   I have reviewed the triage vital signs and the nursing notes. Pertinent labs &imaging results that were available during my care of the patient were reviewed by me and considered in my medical decision making (see chart for details).  After history, exam, and medical workup I feel the patient has been appropriately medically screened and is safe for discharge home. Pertinent diagnoses were discussed with the patient. Patient was given return precautions      Tricia Pledger, MD 02/23/19 3568

## 2019-02-23 NOTE — ED Notes (Signed)
Pt to XR at this time

## 2019-02-23 NOTE — ED Notes (Signed)
Pt. returned from XR. 

## 2019-02-25 ENCOUNTER — Ambulatory Visit: Payer: BC Managed Care – PPO | Admitting: Family Medicine

## 2019-02-25 ENCOUNTER — Other Ambulatory Visit: Payer: Self-pay | Admitting: Cardiovascular Disease

## 2019-02-26 ENCOUNTER — Other Ambulatory Visit: Payer: Self-pay

## 2019-02-26 ENCOUNTER — Emergency Department (HOSPITAL_BASED_OUTPATIENT_CLINIC_OR_DEPARTMENT_OTHER)
Admission: EM | Admit: 2019-02-26 | Discharge: 2019-02-26 | Disposition: A | Discharge status: Home / Self Care | Payer: BC Managed Care – PPO | Attending: Emergency Medicine | Admitting: Emergency Medicine

## 2019-02-26 ENCOUNTER — Encounter (HOSPITAL_BASED_OUTPATIENT_CLINIC_OR_DEPARTMENT_OTHER): Payer: Self-pay | Admitting: *Deleted

## 2019-02-26 DIAGNOSIS — I5042 Chronic combined systolic (congestive) and diastolic (congestive) heart failure: Secondary | ICD-10-CM | POA: Insufficient documentation

## 2019-02-26 DIAGNOSIS — M79605 Pain in left leg: Secondary | ICD-10-CM | POA: Diagnosis not present

## 2019-02-26 DIAGNOSIS — I2510 Atherosclerotic heart disease of native coronary artery without angina pectoris: Secondary | ICD-10-CM | POA: Diagnosis not present

## 2019-02-26 DIAGNOSIS — Z79899 Other long term (current) drug therapy: Secondary | ICD-10-CM | POA: Insufficient documentation

## 2019-02-26 DIAGNOSIS — F411 Generalized anxiety disorder: Secondary | ICD-10-CM | POA: Diagnosis not present

## 2019-02-26 DIAGNOSIS — M25552 Pain in left hip: Secondary | ICD-10-CM | POA: Diagnosis not present

## 2019-02-26 DIAGNOSIS — F438 Other reactions to severe stress: Secondary | ICD-10-CM | POA: Diagnosis not present

## 2019-02-26 MED ORDER — OXYCODONE HCL 5 MG PO TABS: 5.0000 mg | ORAL_TABLET | ORAL | 0 refills | Status: DC | PRN

## 2019-02-26 MED ORDER — ONDANSETRON 4 MG PO TBDP
4.0000 mg | ORAL_TABLET | Freq: Once | ORAL | Status: AC
  Administered 2019-02-26: 4 mg via ORAL
  Filled 2019-02-26: qty 1

## 2019-02-26 MED ORDER — ONDANSETRON HCL 4 MG PO TABS: 4.0000 mg | ORAL_TABLET | Freq: Four times a day (QID) | ORAL | 0 refills | Status: DC

## 2019-02-26 MED ORDER — HYDROMORPHONE HCL 1 MG/ML IJ SOLN
1.0000 mg | Freq: Once | INTRAMUSCULAR | Status: AC
  Administered 2019-02-26: 1 mg via INTRAMUSCULAR
  Filled 2019-02-26: qty 1

## 2019-02-26 NOTE — ED Notes (Signed)
Pt requested to be admitted to [facility] to 'be observed and for pain medication for breakthrough pain'  Pt also stated he would need to call [place] board of licensing for RNs due to RNs not wearing gloves upon entering his room.   Pt appreciative for med admin.

## 2019-02-26 NOTE — ED Triage Notes (Signed)
Left leg pain recheck.

## 2019-02-26 NOTE — ED Notes (Signed)
Pt ambulated out of room without instruction and went to registration desk before discharge. Md informed and printed discharge paperwork for pt with scripts, pt left building before RN could go over instructions and give pt paperwork.

## 2019-02-26 NOTE — ED Provider Notes (Signed)
[facility] EMERGENCY DEPARTMENT Provider Note   CSN: [id number] Arrival date & time: 02/26/19  1925     History   Chief Complaint Chief Complaint  Patient presents with  . Leg Pain    HPI Alejandro Ramos is a 63 y.o. male.     The history is provided by the patient.  Leg Pain Location:  Hip Hip location:  L hip Pain details:    Quality:  Aching   Radiates to:  Does not radiate   Severity:  Mild   Onset quality:  Gradual   Timing:  Intermittent   Progression:  Waxing and waning Chronicity:  Recurrent Relieved by:  Nothing Worsened by:  Activity Associated symptoms: no back pain, no decreased ROM, no fatigue, no fever, no itching, no muscle weakness, no neck pain, no numbness, no stiffness, no swelling and no tingling     Past Medical History:  Diagnosis Date  . Back pain, chronic 12/03/2011  . CAD (coronary artery disease)   . CHF (congestive heart failure) (HCC)   . Chronic prostatitis   . 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 GERD (gastroesophageal reflux disease)   . H/O: drug dependency (HCC)   . IBS (irritable bowel syndrome)   . Kidney stone   . LBBB (left bundle branch block)   . OSA (obstructive sleep apnea)   . Sleep apnea 12/03/2011   Has a C-PAP machine, but does not use it.  . TMJ syndrome 12/03/2011    Patient Active Problem List   Diagnosis Date Noted  . Acute respiratory failure with hypoxia (HCC) 10/21/2017  . CAP (community acquired pneumonia) 10/21/2017  . Ambien use disorder, severe (HCC) 01/31/2017  . Withdrawal from sedative drug (HCC) 01/31/2017  . Ambien use disorder, severe, dependence (HCC) 01/31/2017  . Chronic insomnia 01/25/2016  . Chronic combined systolic and diastolic heart failure (HCC) 01/06/2016  . Nonischemic cardiomyopathy (HCC) 01/06/2016  . LBBB (left bundle branch block) 01/06/2016  . Pre-diabetes 01/06/2016  . CAD (coronary artery disease) 12/10/2015  . Mixed  hyperlipidemia 12/10/2015  . Severe obesity (BMI 35.0-35.9 with comorbidity) (HCC) 12/10/2015  . OSA (obstructive sleep apnea) 12/10/2015  . Syncope 12/10/2015  . Anxiety 03/07/2012  . Alcohol dependence (HCC) 02/28/2012  . Back pain, chronic 12/03/2011    Past Surgical History:  Procedure Laterality Date  . CARDIAC CATHETERIZATION N/A 01/12/2016   Procedure: Right/Left Heart Cath and Coronary Angiography;  Surgeon: Thurmon Fair, MD; RCA 15%, LM 25-30%, LVEDP 12 mm Hg, PWCP mean 14 mm Hg, PA 35/20, mean 26 mm Hg, CO 4.9 L/min, CI 2 L/min (sq)        Home Medications    Prior to Admission medications   Medication Sig Start Date End Date Taking? Authorizing Provider  acetaminophen (TYLENOL) 500 MG tablet Take 1,000 mg by mouth every 6 (six) hours as needed for moderate pain or headache.    [provider]  alosetron (LOTRONEX) 0.5 MG tablet Take 0.5 mg by mouth 2 (two) times daily. 09/13/17   [provider]  bisoprolol (ZEBETA) 10 MG tablet Take by mouth daily. Pt takes 5 mg daily at bedtime    [provider]  dexlansoprazole (DEXILANT) 60 MG capsule Take 60 mg by mouth daily.    [provider]  Diclofenac Sodium CR 100 MG 24 hr tablet Take 1 tablet (100 mg total) by mouth daily. 02/23/19   Palumbo, April, MD  DULoxetine (CYMBALTA) 60 MG capsule TAKE  1 CAPSULE (60 MG TOTAL) BY MOUTH ONCE DAILY. 01/22/17   [provider]  finasteride (PROSCAR) 5 MG tablet Take 5 mg by mouth daily.  01/18/16   [provider]  fluticasone (FLONASE) 50 MCG/ACT nasal spray Place 2 sprays into both nostrils daily. 10/25/17   Rodolph Bonghompson, Daniel V, MD  gabapentin (NEURONTIN) 600 MG tablet Take 600 mg by mouth 3 (three) times daily.     [provider]  ibuprofen (ADVIL,MOTRIN) 200 MG tablet Take 800 mg by mouth every 6 (six) hours as needed for moderate pain.    [provider]  loratadine (CLARITIN) 10 MG tablet Take 1 tablet (10 mg total)  by mouth daily. 10/25/17   Rodolph Bonghompson, Daniel V, MD  Multiple Vitamins-Minerals (MENS 50+ MULTI VITAMIN/MIN PO) Take 1 tablet by mouth daily.    [provider]  ondansetron (ZOFRAN) 4 MG tablet Take 1 tablet (4 mg total) by mouth every 6 (six) hours. 02/26/19   Tom Ragsdale, DO  oxyCODONE (ROXICODONE) 5 MG immediate release tablet Take 1 tablet (5 mg total) by mouth every 4 (four) hours as needed for up to 15 doses for breakthrough pain. 02/26/19   Yolinda Duerr, DO  polyethylene glycol powder (MIRALAX) 17 GM/SCOOP powder Take 17 g by mouth daily. 02/23/19   Palumbo, April, MD  ramelteon (ROZEREM) 8 MG tablet Take 8 mg by mouth at bedtime.    [provider]  sacubitril-valsartan (ENTRESTO) 49-51 MG Take 1 tablet by mouth 2 (two) times daily. 05/04/18   Croitoru, Mihai, MD  spironolactone (ALDACTONE) 25 MG tablet TAKE 0.5 TABLETS (12.5 MG TOTAL) BY MOUTH ONCE DAILY. 02/25/19   Croitoru, Mihai, MD  tiZANidine (ZANAFLEX) 2 MG tablet Take 2 mg by mouth every 8 (eight) hours as needed for muscle spasms.  01/26/17   [provider]  triazolam (HALCION) 0.25 MG tablet Take 0.25 mg by mouth at bedtime.    [provider]    Family History Family History  Problem Relation Age of Onset  . Paranoid behavior Father   . Congestive Heart Failure Father   . Arrhythmia Mother        has a PPM  . Heart attack Maternal Grandmother        5180s  . Arrhythmia Maternal Grandfather        880s  . Heart attack Paternal Grandmother [phone]  . Heart attack Paternal Grandfather [id number]    Social History Social History   Tobacco Use  . Smoking status: Never Smoker  . Smokeless tobacco: Never Used  Substance Use Topics  . Alcohol use: No  . Drug use: No    Types: Hydrocodone, Other-see comments     Allergies   Patient has no known allergies.   Review of Systems Review of Systems  Constitutional: Negative for chills, fatigue and fever.  HENT: Negative for ear pain and sore  throat.   Eyes: Negative for pain and visual disturbance.  Respiratory: Negative for cough and shortness of breath.   Cardiovascular: Negative for chest pain and palpitations.  Gastrointestinal: Negative for abdominal pain and vomiting.  Genitourinary: Negative for dysuria and hematuria.  Musculoskeletal: Positive for arthralgias and gait problem. Negative for back pain, neck pain and stiffness.  Skin: Negative for color change, itching and rash.  Neurological: Negative for seizures and syncope.  All other systems reviewed and are negative.    Physical Exam Updated Vital Signs BP 109/78   Pulse 85   Temp 98.6 F (37 C) (Oral)  Resp 20   Ht 5\' 10"  (1.778 m)   Wt 120.3 kg   SpO2 97%   BMI 38.05 kg/m   Physical Exam Vitals signs and nursing note reviewed.  Constitutional:      Appearance: He is well-developed.  HENT:     Head: Normocephalic and atraumatic.  Eyes:     Conjunctiva/sclera: Conjunctivae normal.  Neck:     Musculoskeletal: Normal range of motion and neck supple. No muscular tenderness.  Cardiovascular:     Rate and Rhythm: Normal rate and regular rhythm.     Heart sounds: No murmur.  Pulmonary:     Effort: Pulmonary effort is normal. No respiratory distress.     Breath sounds: Normal breath sounds.  Abdominal:     Palpations: Abdomen is soft.     Tenderness: There is no abdominal tenderness.  Musculoskeletal: Normal range of motion.        General: Tenderness present. No swelling, deformity or signs of injury.     Comments: Tenderness to the left hip  Skin:    General: Skin is warm and dry.     Capillary Refill: Capillary refill takes less than 2 seconds.  Neurological:     General: No focal deficit present.     Mental Status: He is alert and oriented to person, place, and time.     Cranial Nerves: No cranial nerve deficit.     Sensory: No sensory deficit.     Motor: No weakness.     Coordination: Coordination normal.     Comments: 5+ out of 5  strength throughout left lower extremity, normal sensation      ED Treatments / Results  Labs (all labs ordered are listed, but only abnormal results are displayed) Labs Reviewed - No data to display  EKG None  Radiology No results found.  Procedures Procedures (including critical care time)  Medications Ordered in ED Medications  ondansetron (ZOFRAN-ODT) disintegrating tablet 4 mg (4 mg Oral Given 02/26/19 2021)  HYDROmorphone (DILAUDID) injection 1 mg (1 mg Intramuscular Given 02/26/19 2021)     Initial Impression / Assessment and Plan / ED Course  I have reviewed the triage vital signs and the nursing notes.  Pertinent labs & imaging results that were available during my care of the patient were reviewed by me and considered in my medical decision making (see chart for details).         Alejandro Ramos is a 63 year old male with history of IBS, CAD who presents the ED with left hip pain.  Has had ongoing pain since a fall several days ago.  Was here and had a negative x-ray of his left hip.  However he does have some severe arthritic changes.  Has tried multiple over-the-counter medications, muscle relaxant, lidocaine patch without much help.  Denies any new trauma.  Has some tenderness in the left hip but is neurologically intact.  May be some component to sciatic type pain.  Has no midline back pain.  Overall likely acute on chronic arthritic type pain.  Will give IM Dilaudid for breakthrough pain.  No active narcotic scripts.  States that he has follow-up with orthopedic later this week to discuss chronic pain management, possible hip replacement.  Will prescribe Roxicodone for breakthrough pain.  Given Zofran as he states that he gets nauseous sometimes with narcotic pain medicine.  Patient had good pulses on exam.  Doubt any arterial process.  No concern for DVT.  No concern for peripheral artery disease.  Given return precautions and discharged from ED in good condition.   This chart was dictated using voice recognition software.  Despite best efforts to proofread,  errors can occur which can change the documentation meaning.    Final Clinical Impressions(s) / ED Diagnoses   Final diagnoses:  Left leg pain    ED Discharge Orders         Ordered    oxyCODONE (ROXICODONE) 5 MG immediate release tablet  Every 4 hours PRN     02/26/19 2000    ondansetron (ZOFRAN) 4 MG tablet  Every 6 hours     02/26/19 2000           Lennice Sites, DO 02/26/19 2041

## 2019-02-26 NOTE — ED Notes (Signed)
Pt. Reports he would like to have either morphine or dilaudid or demerol for his pain and would like for the nurse to relay this to the Dr. So he does not have to repeat himself.  RN explained to the Pt. He will need to let the Dr. Gwyndolyn Kaufman his wishes due to he is being a little specific on his wants.    Pt. Very rude to RN and asked if RN finished her Degree in nursing.

## 2019-02-26 NOTE — ED Notes (Signed)
Pt. Reports he is going to see an orthopedist next week.

## 2019-02-26 NOTE — Discharge Instructions (Addendum)
Follow-up with your orthopedics as already scheduled.

## 2019-02-27 ENCOUNTER — Ambulatory Visit: Payer: BC Managed Care – PPO | Admitting: Family Medicine

## 2019-03-01 DIAGNOSIS — S335XXA Sprain of ligaments of lumbar spine, initial encounter: Secondary | ICD-10-CM | POA: Diagnosis not present

## 2019-03-01 DIAGNOSIS — M160 Bilateral primary osteoarthritis of hip: Secondary | ICD-10-CM | POA: Diagnosis not present

## 2019-03-01 DIAGNOSIS — S8002XA Contusion of left knee, initial encounter: Secondary | ICD-10-CM | POA: Diagnosis not present

## 2019-03-01 DIAGNOSIS — S8001XA Contusion of right knee, initial encounter: Secondary | ICD-10-CM | POA: Diagnosis not present

## 2019-03-01 DIAGNOSIS — S93492A Sprain of other ligament of left ankle, initial encounter: Secondary | ICD-10-CM | POA: Diagnosis not present

## 2019-03-01 DIAGNOSIS — M25511 Pain in right shoulder: Secondary | ICD-10-CM | POA: Diagnosis not present

## 2019-03-01 DIAGNOSIS — S40012A Contusion of left shoulder, initial encounter: Secondary | ICD-10-CM | POA: Diagnosis not present

## 2019-03-02 ENCOUNTER — Other Ambulatory Visit: Payer: Self-pay

## 2019-03-02 ENCOUNTER — Emergency Department (HOSPITAL_COMMUNITY)
Admission: EM | Admit: 2019-03-02 | Discharge: 2019-03-02 | Disposition: A | Discharge status: Home / Self Care | Payer: BC Managed Care – PPO | Attending: Emergency Medicine | Admitting: Emergency Medicine

## 2019-03-02 ENCOUNTER — Encounter (HOSPITAL_COMMUNITY): Payer: Self-pay

## 2019-03-02 DIAGNOSIS — R0902 Hypoxemia: Secondary | ICD-10-CM | POA: Diagnosis not present

## 2019-03-02 DIAGNOSIS — R52 Pain, unspecified: Secondary | ICD-10-CM | POA: Diagnosis not present

## 2019-03-02 DIAGNOSIS — W19XXXA Unspecified fall, initial encounter: Secondary | ICD-10-CM | POA: Diagnosis not present

## 2019-03-02 DIAGNOSIS — Z5321 Procedure and treatment not carried out due to patient leaving prior to being seen by health care provider: Secondary | ICD-10-CM | POA: Diagnosis not present

## 2019-03-02 DIAGNOSIS — J189 Pneumonia, unspecified organism: Secondary | ICD-10-CM | POA: Diagnosis not present

## 2019-03-02 DIAGNOSIS — M79605 Pain in left leg: Secondary | ICD-10-CM | POA: Diagnosis not present

## 2019-03-02 NOTE — ED Triage Notes (Signed)
Pt reports L leg and hip pain after falling down some steps last week. Pt has been seen 2x for the same.

## 2019-03-02 NOTE — ED Notes (Signed)
Pt denies taking any medication PTA.

## 2019-03-02 NOTE — ED Notes (Addendum)
Pt stated, "I demand that you sit with me out here until my Melburn Popper arrives. What is your name? I am going to report you tomorrow to the president of the hospital."

## 2019-03-02 NOTE — ED Notes (Signed)
Pt states that his family will not give [place] anymore money. He states that his family built [employer] [employer] and [address] and he will be talking to the board of directors for having to wait in triage. This RN explained that we are allowing him to wait in triage because we do not have any open rooms at this time. He states that he doesn't care that there are no rooms, he deserves to be seen now.

## 2019-03-02 NOTE — ED Notes (Addendum)
Pt ambulated out of ED. He states that he will take an [place] home. Patient encouraged to stay and wait for a room. He refused.

## 2019-03-02 NOTE — ED Notes (Signed)
EMS reports that patient may be intoxicated.

## 2019-03-03 DIAGNOSIS — R0902 Hypoxemia: Secondary | ICD-10-CM | POA: Diagnosis not present

## 2019-03-03 DIAGNOSIS — M79652 Pain in left thigh: Secondary | ICD-10-CM | POA: Diagnosis not present

## 2019-03-04 ENCOUNTER — Ambulatory Visit: Payer: BC Managed Care – PPO | Admitting: Family Medicine

## 2019-03-04 DIAGNOSIS — R0902 Hypoxemia: Secondary | ICD-10-CM | POA: Diagnosis not present

## 2019-03-04 DIAGNOSIS — I5042 Chronic combined systolic (congestive) and diastolic (congestive) heart failure: Secondary | ICD-10-CM | POA: Diagnosis not present

## 2019-03-04 DIAGNOSIS — G894 Chronic pain syndrome: Secondary | ICD-10-CM | POA: Diagnosis not present

## 2019-03-04 DIAGNOSIS — R5382 Chronic fatigue, unspecified: Secondary | ICD-10-CM | POA: Diagnosis not present

## 2019-03-05 ENCOUNTER — Other Ambulatory Visit: Payer: Self-pay

## 2019-03-07 DIAGNOSIS — S8002XA Contusion of left knee, initial encounter: Secondary | ICD-10-CM | POA: Diagnosis not present

## 2019-03-07 DIAGNOSIS — M1712 Unilateral primary osteoarthritis, left knee: Secondary | ICD-10-CM | POA: Diagnosis not present

## 2019-03-12 ENCOUNTER — Emergency Department (HOSPITAL_COMMUNITY)
Admission: EM | Admit: 2019-03-12 | Discharge: 2019-03-12 | Disposition: A | Discharge status: Home / Self Care | Payer: BC Managed Care – PPO | Attending: Emergency Medicine | Admitting: Emergency Medicine

## 2019-03-12 ENCOUNTER — Other Ambulatory Visit: Payer: Self-pay

## 2019-03-12 ENCOUNTER — Encounter (HOSPITAL_COMMUNITY): Payer: Self-pay | Admitting: Emergency Medicine

## 2019-03-12 DIAGNOSIS — R258 Other abnormal involuntary movements: Secondary | ICD-10-CM | POA: Diagnosis not present

## 2019-03-12 DIAGNOSIS — F4310 Post-traumatic stress disorder, unspecified: Secondary | ICD-10-CM | POA: Diagnosis not present

## 2019-03-12 DIAGNOSIS — F1120 Opioid dependence, uncomplicated: Secondary | ICD-10-CM | POA: Diagnosis not present

## 2019-03-12 DIAGNOSIS — I2510 Atherosclerotic heart disease of native coronary artery without angina pectoris: Secondary | ICD-10-CM | POA: Insufficient documentation

## 2019-03-12 DIAGNOSIS — Z046 Encounter for general psychiatric examination, requested by authority: Secondary | ICD-10-CM | POA: Diagnosis not present

## 2019-03-12 DIAGNOSIS — I5042 Chronic combined systolic (congestive) and diastolic (congestive) heart failure: Secondary | ICD-10-CM | POA: Insufficient documentation

## 2019-03-12 DIAGNOSIS — R451 Restlessness and agitation: Secondary | ICD-10-CM | POA: Diagnosis present

## 2019-03-12 DIAGNOSIS — F329 Major depressive disorder, single episode, unspecified: Secondary | ICD-10-CM | POA: Diagnosis not present

## 2019-03-12 DIAGNOSIS — F1310 Sedative, hypnotic or anxiolytic abuse, uncomplicated: Secondary | ICD-10-CM | POA: Diagnosis not present

## 2019-03-12 DIAGNOSIS — Z79899 Other long term (current) drug therapy: Secondary | ICD-10-CM | POA: Diagnosis not present

## 2019-03-12 DIAGNOSIS — F1910 Other psychoactive substance abuse, uncomplicated: Secondary | ICD-10-CM

## 2019-03-12 DIAGNOSIS — F603 Borderline personality disorder: Secondary | ICD-10-CM | POA: Diagnosis not present

## 2019-03-12 LAB — COMPREHENSIVE METABOLIC PANEL
ALT: 26 U/L (ref 0–44)
AST: 17 U/L (ref 15–41)
Albumin: 3.7 g/dL (ref 3.5–5.0)
Alkaline Phosphatase: 76 U/L (ref 38–126)
Anion gap: 12 (ref 5–15)
BUN: 32 mg/dL — ABNORMAL HIGH (ref 8–23)
CO2: 21 mmol/L — ABNORMAL LOW (ref 22–32)
Calcium: 9.1 mg/dL (ref 8.9–10.3)
Chloride: 105 mmol/L (ref 98–111)
Creatinine, Ser: 1.02 mg/dL (ref 0.61–1.24)
GFR calc Af Amer: >60 mL/min (ref >60)
GFR calc non Af Amer: >60 mL/min (ref >60)
Glucose, Bld: 99 mg/dL (ref 70–99)
Potassium: 3.9 mmol/L (ref 3.5–5.1)
Sodium: 138 mmol/L (ref 135–145)
Total Bilirubin: 0.4 mg/dL (ref 0.3–1.2)
Total Protein: 6.9 g/dL (ref 6.5–8.1)

## 2019-03-12 LAB — ETHANOL: Alcohol, Ethyl (B): <10 mg/dL (ref <10)

## 2019-03-12 LAB — CBC
HCT: 46.5 % (ref 39.0–52.0)
Hemoglobin: 15.6 g/dL (ref 13.0–17.0)
MCH: 31.1 pg (ref 26.0–34.0)
MCHC: 33.5 g/dL (ref 30.0–36.0)
MCV: 92.6 fL (ref 80.0–100.0)
Platelets: 325 10*3/uL (ref 150–400)
RBC: 5.02 MIL/uL (ref 4.22–5.81)
RDW: 13.2 % (ref 11.5–15.5)
WBC: 12.1 10*3/uL — ABNORMAL HIGH (ref 4.0–10.5)
nRBC: 0 % (ref 0.0–0.2)

## 2019-03-12 LAB — RAPID URINE DRUG SCREEN, HOSP PERFORMED
Amphetamines: NOT DETECTED
Barbiturates: NOT DETECTED
Benzodiazepines: POSITIVE — AB
Cocaine: NOT DETECTED
Opiates: POSITIVE — AB
Tetrahydrocannabinol: NOT DETECTED

## 2019-03-12 MED ORDER — FINASTERIDE 5 MG PO TABS
5.0000 mg | ORAL_TABLET | Freq: Every day | ORAL | Status: DC
  Administered 2019-03-12: 5 mg via ORAL
  Filled 2019-03-12: qty 1

## 2019-03-12 MED ORDER — BISMUTH SUBSALICYLATE 262 MG/15ML PO SUSP
30.0000 mL | ORAL | Status: DC | PRN
  Administered 2019-03-12: 30 mL via ORAL
  Filled 2019-03-12: qty 236

## 2019-03-12 MED ORDER — ACETAMINOPHEN 500 MG PO TABS: 1000.0000 mg | ORAL_TABLET | Freq: Four times a day (QID) | ORAL | Status: DC | PRN

## 2019-03-12 MED ORDER — IBUPROFEN 800 MG PO TABS: 800.0000 mg | ORAL_TABLET | Freq: Four times a day (QID) | ORAL | Status: DC | PRN

## 2019-03-12 MED ORDER — LORATADINE 10 MG PO TABS
10.0000 mg | ORAL_TABLET | Freq: Every day | ORAL | Status: DC
  Administered 2019-03-12: 10 mg via ORAL
  Filled 2019-03-12: qty 1

## 2019-03-12 MED ORDER — SACUBITRIL-VALSARTAN 49-51 MG PO TABS
1.0000 | ORAL_TABLET | Freq: Two times a day (BID) | ORAL | Status: DC
  Administered 2019-03-12: 1 via ORAL
  Filled 2019-03-12 (×2): qty 1

## 2019-03-12 MED ORDER — OXYCODONE HCL 5 MG PO TABS: 5.0000 mg | ORAL_TABLET | ORAL | Status: DC | PRN

## 2019-03-12 MED ORDER — POLYETHYLENE GLYCOL 3350 17 G PO PACK
17.0000 g | PACK | Freq: Every day | ORAL | Status: DC
  Administered 2019-03-12: 10:00:00 17 g via ORAL
  Filled 2019-03-12: qty 1

## 2019-03-12 MED ORDER — PANTOPRAZOLE SODIUM 40 MG PO TBEC
40.0000 mg | DELAYED_RELEASE_TABLET | Freq: Every day | ORAL | Status: DC
  Administered 2019-03-12: 10:00:00 40 mg via ORAL
  Filled 2019-03-12: qty 1

## 2019-03-12 MED ORDER — ADULT MULTIVITAMIN W/MINERALS CH
1.0000 | ORAL_TABLET | Freq: Every day | ORAL | Status: DC
  Administered 2019-03-12: 1 via ORAL
  Filled 2019-03-12: qty 1

## 2019-03-12 MED ORDER — BISOPROLOL FUMARATE 5 MG PO TABS
5.0000 mg | ORAL_TABLET | Freq: Every day | ORAL | Status: DC
  Filled 2019-03-12: qty 1

## 2019-03-12 MED ORDER — ALFUZOSIN HCL ER 10 MG PO TB24
10.0000 mg | ORAL_TABLET | Freq: Every day | ORAL | Status: DC
  Administered 2019-03-12: 10 mg via ORAL
  Filled 2019-03-12: qty 1

## 2019-03-12 MED ORDER — TIZANIDINE HCL 4 MG PO TABS: 2.0000 mg | ORAL_TABLET | Freq: Three times a day (TID) | ORAL | Status: DC | PRN

## 2019-03-12 MED ORDER — GABAPENTIN 600 MG PO TABS
600.0000 mg | ORAL_TABLET | Freq: Three times a day (TID) | ORAL | Status: DC
  Administered 2019-03-12: 600 mg via ORAL
  Filled 2019-03-12 (×4): qty 1

## 2019-03-12 MED ORDER — RAMELTEON 8 MG PO TABS
8.0000 mg | ORAL_TABLET | Freq: Every day | ORAL | Status: DC
  Filled 2019-03-12: qty 1

## 2019-03-12 MED ORDER — DULOXETINE HCL 60 MG PO CPEP
60.0000 mg | ORAL_CAPSULE | Freq: Every day | ORAL | Status: DC
  Filled 2019-03-12: qty 1

## 2019-03-12 MED ORDER — ALOSETRON HCL 0.5 MG PO TABS: 0.5000 mg | ORAL_TABLET | Freq: Two times a day (BID) | ORAL | Status: DC

## 2019-03-12 MED ORDER — SPIRONOLACTONE 12.5 MG HALF TABLET
12.5000 mg | ORAL_TABLET | Freq: Every day | ORAL | Status: DC
  Administered 2019-03-12: 12.5 mg via ORAL
  Filled 2019-03-12: qty 1

## 2019-03-12 MED ORDER — ONDANSETRON HCL 4 MG PO TABS: 4.0000 mg | ORAL_TABLET | Freq: Four times a day (QID) | ORAL | Status: DC | PRN

## 2019-03-12 MED ORDER — TRIAZOLAM 0.25 MG PO TABS: 0.5000 mg | ORAL_TABLET | Freq: Every day | ORAL | Status: DC

## 2019-03-12 NOTE — ED Triage Notes (Signed)
Patient is IVC, patient has been taking illegal and unprescribed drugs.  Patient IVC by his brother.  He took 5 hydrocodone.

## 2019-03-12 NOTE — Discharge Instructions (Addendum)
Your IVC paperwork was rescinded after your psychiatric evaluation.  I provided multiple resources attached to your chart should you want to seek substance abuse rehab.  Please follow-up with your primary care physician as needed.

## 2019-03-12 NOTE — ED Provider Notes (Signed)
[facility]   CSN: [id number] Arrival date & time: 03/12/19  0009     History   Chief Complaint Chief Complaint  Patient presents with  . Medical Clearance    HPI Alejandro SaucierJohn D Dechaine is a 63 y.o. male.     The history is provided by the patient and medical records.    63 y.o. M with hx of chronic back pain, CAD, CHF, GERD, IBS, OSA, TMJ, presenting to the ED under IVC petitioned by his brother.  Patient reports he and brother have been arguing lately because he does not agree with brothers actions.  Reportedly, brother is an Pensions consultantattorney and has been foreclosing homes all over [place] and surrounding counties lately.  Patient reports he disagrees with this because a lot of these people are poor or out of work and have nowhere else to go.  He states "it is not christian like".  States they have argued about this numerous times and brother IVC'd him.  Patient denies SI/HI/AVH.  States he plans to counter sue his brother as soon as he leaves here.  Per IVC paperwork, patient has reportedly been using/abusing illicit and prescriptions drugs-- reports he took 4 hydrocodone today.  Patient denies this stating "I know that is bad for my heart, why would I do that?"  Past Medical History:  Diagnosis Date  . Back pain, chronic 12/03/2011  . CAD (coronary artery disease)   . CHF (congestive heart failure) (HCC)   . Chronic prostatitis   . 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 GERD (gastroesophageal reflux disease)   . H/O: drug dependency (HCC)   . IBS (irritable bowel syndrome)   . Kidney stone   . LBBB (left bundle branch block)   . OSA (obstructive sleep apnea)   . Sleep apnea 12/03/2011   Has a C-PAP machine, but does not use it.  . TMJ syndrome 12/03/2011    Patient Active Problem List   Diagnosis Date Noted  . Acute respiratory failure with hypoxia (HCC) 10/21/2017  . CAP (community  acquired pneumonia) 10/21/2017  . Ambien use disorder, severe (HCC) 01/31/2017  . Withdrawal from sedative drug (HCC) 01/31/2017  . Ambien use disorder, severe, dependence (HCC) 01/31/2017  . Chronic insomnia 01/25/2016  . Chronic combined systolic and diastolic heart failure (HCC) 01/06/2016  . Nonischemic cardiomyopathy (HCC) 01/06/2016  . LBBB (left bundle branch block) 01/06/2016  . Pre-diabetes 01/06/2016  . CAD (coronary artery disease) 12/10/2015  . Mixed hyperlipidemia 12/10/2015  . Severe obesity (BMI 35.0-35.9 with comorbidity) (HCC) 12/10/2015  . OSA (obstructive sleep apnea) 12/10/2015  . Syncope 12/10/2015  . Anxiety 03/07/2012  . Alcohol dependence (HCC) 02/28/2012  . Back pain, chronic 12/03/2011    Past Surgical History:  Procedure Laterality Date  . CARDIAC CATHETERIZATION N/A 01/12/2016   Procedure: Right/Left Heart Cath and Coronary Angiography;  Surgeon: Thurmon FairMihai Croitoru, MD; RCA 15%, LM 25-30%, LVEDP 12 mm Hg, PWCP mean 14 mm Hg, PA 35/20, mean 26 mm Hg, CO 4.9 L/min, CI 2 L/min (sq)        Home Medications    Prior to Admission medications   Medication Sig Start Date End Date Taking? Authorizing Provider  acetaminophen (TYLENOL) 500 MG tablet Take 1,000 mg by mouth every 6 (six) hours as needed for moderate pain or headache.    [provider]  alosetron (LOTRONEX) 0.5 MG tablet Take 0.5 mg by mouth 2 (two) times daily. 09/13/17  [provider]  bisoprolol (ZEBETA) 10 MG tablet Take by mouth daily. Pt takes 5 mg daily at bedtime    [provider]  dexlansoprazole (DEXILANT) 60 MG capsule Take 60 mg by mouth daily.    [provider]  Diclofenac Sodium CR 100 MG 24 hr tablet Take 1 tablet (100 mg total) by mouth daily. 02/23/19   Palumbo, April, MD  DULoxetine (CYMBALTA) 60 MG capsule TAKE 1 CAPSULE (60 MG TOTAL) BY MOUTH ONCE DAILY. 01/22/17   [provider]  finasteride (PROSCAR) 5 MG tablet Take 5 mg by mouth  daily.  01/18/16   [provider]  fluticasone (FLONASE) 50 MCG/ACT nasal spray Place 2 sprays into both nostrils daily. 10/25/17   Eugenie Filler, MD  gabapentin (NEURONTIN) 600 MG tablet Take 600 mg by mouth 3 (three) times daily.     [provider]  ibuprofen (ADVIL,MOTRIN) 200 MG tablet Take 800 mg by mouth every 6 (six) hours as needed for moderate pain.    [provider]  loratadine (CLARITIN) 10 MG tablet Take 1 tablet (10 mg total) by mouth daily. 10/25/17   Eugenie Filler, MD  Multiple Vitamins-Minerals (MENS 50+ MULTI VITAMIN/MIN PO) Take 1 tablet by mouth daily.    [provider]  ondansetron (ZOFRAN) 4 MG tablet Take 1 tablet (4 mg total) by mouth every 6 (six) hours. 02/26/19   Curatolo, Adam, DO  oxyCODONE (ROXICODONE) 5 MG immediate release tablet Take 1 tablet (5 mg total) by mouth every 4 (four) hours as needed for up to 15 doses for breakthrough pain. 02/26/19   Curatolo, Adam, DO  polyethylene glycol powder (MIRALAX) 17 GM/SCOOP powder Take 17 g by mouth daily. 02/23/19   Palumbo, April, MD  ramelteon (ROZEREM) 8 MG tablet Take 8 mg by mouth at bedtime.    [provider]  sacubitril-valsartan (ENTRESTO) 49-51 MG Take 1 tablet by mouth 2 (two) times daily. 05/04/18   Croitoru, Mihai, MD  spironolactone (ALDACTONE) 25 MG tablet TAKE 0.5 TABLETS (12.5 MG TOTAL) BY MOUTH ONCE DAILY. 02/25/19   Croitoru, Mihai, MD  tiZANidine (ZANAFLEX) 2 MG tablet Take 2 mg by mouth every 8 (eight) hours as needed for muscle spasms.  01/26/17   [provider]  triazolam (HALCION) 0.25 MG tablet Take 0.25 mg by mouth at bedtime.    [provider]    Family History Family History  Problem Relation Age of Onset  . Paranoid behavior Father   . Congestive Heart Failure Father   . Arrhythmia Mother        has a PPM  . Heart attack Maternal Grandmother        26s  . Arrhythmia Maternal Grandfather        47s  . Heart attack  Paternal Grandmother 93  . Heart attack Paternal Grandfather 79    Social History Social History   Tobacco Use  . Smoking status: Never Smoker  . Smokeless tobacco: Never Used  Substance Use Topics  . Alcohol use: No  . Drug use: No    Types: Hydrocodone, Other-see comments     Allergies   Patient has no known allergies.   Review of Systems Review of Systems  Psychiatric/Behavioral:       IVC  All other systems reviewed and are negative.    Physical Exam Updated Vital Signs BP 126/84 (BP Location: Right Arm)   Pulse 91   Temp 98.4 F (36.9 C) (Oral)   Resp  20   SpO2 97%   Physical Exam Vitals signs and nursing note reviewed.  Constitutional:      Appearance: He is well-developed.  HENT:     Head: Normocephalic and atraumatic.  Eyes:     Conjunctiva/sclera: Conjunctivae normal.     Pupils: Pupils are equal, round, and reactive to light.  Neck:     Musculoskeletal: Normal range of motion.  Cardiovascular:     Rate and Rhythm: Normal rate and regular rhythm.     Heart sounds: Normal heart sounds.  Pulmonary:     Effort: Pulmonary effort is normal.     Breath sounds: Normal breath sounds.  Abdominal:     General: Bowel sounds are normal.     Palpations: Abdomen is soft.  Musculoskeletal: Normal range of motion.  Skin:    General: Skin is warm and dry.  Neurological:     Mental Status: He is alert and oriented to person, place, and time.  Psychiatric:     Comments: Does not seem to have insight into why he is here today or why IVC paperwork was taken out, ruminating thoughts about counter-suing his brother Denies SI/HI/AVH      ED Treatments / Results  Labs (all labs ordered are listed, but only abnormal results are displayed) Labs Reviewed  COMPREHENSIVE METABOLIC PANEL - Abnormal; Notable for the following components:      Result Value   CO2 21 (*)    BUN 32 (*)    All other components within normal limits  CBC - Abnormal; Notable for the  following components:   WBC 12.1 (*)    All other components within normal limits  RAPID URINE DRUG SCREEN, HOSP PERFORMED - Abnormal; Notable for the following components:   Opiates POSITIVE (*)    Benzodiazepines POSITIVE (*)    All other components within normal limits  ETHANOL    EKG None  Radiology No results found.  Procedures Procedures (including critical care time)  Medications Ordered in ED Medications  acetaminophen (TYLENOL) tablet 1,000 mg (has no administration in time range)  alfuzosin (UROXATRAL) 24 hr tablet 10 mg (has no administration in time range)  alosetron (LOTRONEX) tablet 0.5 mg (has no administration in time range)  bisoprolol (ZEBETA) tablet 5 mg (has no administration in time range)  pantoprazole (PROTONIX) EC tablet 40 mg (has no administration in time range)  DULoxetine (CYMBALTA) DR capsule 60 mg (has no administration in time range)  finasteride (PROSCAR) tablet 5 mg (has no administration in time range)  gabapentin (NEURONTIN) tablet 600 mg (has no administration in time range)  ibuprofen (ADVIL) tablet 800 mg (has no administration in time range)  loratadine (CLARITIN) tablet 10 mg (has no administration in time range)  multivitamin with minerals tablet 1 tablet (has no administration in time range)  ondansetron (ZOFRAN) tablet 4 mg (has no administration in time range)  polyethylene glycol (MIRALAX / GLYCOLAX) packet 17 g (has no administration in time range)  ramelteon (ROZEREM) tablet 8 mg (has no administration in time range)  sacubitril-valsartan (ENTRESTO) 49-51 mg per tablet (has no administration in time range)  spironolactone (ALDACTONE) tablet 12.5 mg (has no administration in time range)  tiZANidine (ZANAFLEX) tablet 2 mg (has no administration in time range)  triazolam (HALCION) tablet 0.5 mg (has no administration in time range)  oxyCODONE (Oxy IR/ROXICODONE) immediate release tablet 5 mg (has no administration in time range)      Initial Impression / Assessment and Plan / ED Course  I have reviewed the triage vital signs and the nursing notes.  Pertinent labs & imaging results that were available during my care of the patient were reviewed by me and considered in my medical decision making (see chart for details).  63 year old male presenting to the ED under IVC petition by his brother.  Patient reports that brother IVC at him because he has had some issues with his brothers legal practice, specifically for closing on homes of 4 people to have nowhere else to go.  IVC paperwork indicates that patient has been using and abusing prescription and illicit drugs, notably he took 4 hydrocodone today.  He does appear that patient is routinely prescribed narcotics.  He denies taking medications more than indicated or any illicit drug use.  Patient is alert and oriented here, does not seem to have insight into why he is here.  He is speaking often about counter suing his brother, requesting a lawyer, using [employer]'s camera as "witnesses" for court, , etc.  He does have history of PTSD and bipolar disorder, reports he has been compliant with his medications.  Labs are overall reassuring.  UDS is positive for opiates and benzodiazepines, both of which patient is prescribed regularly.  He is medically cleared at this time.  Home medications have been ordered.  Will obtain TTS consult.  6:35 AM TTS consult pending.  Care will be signed out to morning team to follow-up on recommendations.  Final Clinical Impressions(s) / ED Diagnoses   Final diagnoses:  Involuntary commitment    ED Discharge Orders    None       Garlon Hatchet, PA-C 03/12/19 [id number]    Dione Booze, MD 03/12/19 [phone]

## 2019-03-12 NOTE — BHH Counselor (Signed)
Disposition: Per Mordecai Maes, NP patient does not meet in patient criteria and should follow up with outpatient providers and substance use treatment. [place], RN informed of disposition.  This counselor discussed disposition with patient's brother in law as he had numerous concerns. I explained inpatient criteria and at this time he does not meet that criteria. This counselor recommended patient follow up with Dr. Amalia Greenhouse. This counselor explained that patient would have to be willing to go into substance abuse treatment and did not meet IVC criteria at this time.

## 2019-03-12 NOTE — ED Notes (Addendum)
All IVC paper work sent off to [facility], papers served. original copy in red folder and one in med, records.

## 2019-03-12 NOTE — ED Notes (Signed)
Ordered breakfast--Alejandro Ramos 
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
## 2019-03-12 NOTE — ED Provider Notes (Addendum)
  Physical Exam  BP 126/84 (BP Location: Right Arm)   Pulse 91   Temp 98.4 F (36.9 C) (Oral)   Resp 20   SpO2 97%   Physical Exam  ED Course/Procedures     Procedures  MDM  Patient care assumed from Quincy Carnes, PA at shift change, see her note for full HPI.  Briefly, patient here with prior history of bipolar disorder, he reports he currently has not agree with brother.  Patient was IVC by brother.  He does not have any SI, HI, hallucinations.  He is currently pending TTS, all home meds have been ordered by my prior colleague.   11:24 AM patient has been resting in the ED without any complaints.  He was evaluated by TTS who recommended patient does not need patient criteria and should follow outpatient providers and substance use treatment.  He was evaluated by Durene Cal, NP. Psychiatric counselor had communicated with patient's brother.  I have spoken to brother who is agreeable on being discharged home.  BP 126/84 (BP Location: Right Arm)   Pulse 91   Temp 98.4 F (36.9 C) (Oral)   Resp 20   SpO2 97%     Patient with stable vital signs, stable for discharge.  Portions of this note were generated with Lobbyist. Dictation errors may occur despite best attempts at proofreading.          Janeece Fitting, PA-C 03/12/19 1126    Tegeler, Gwenyth Allegra, MD 03/12/19 1620    Tegeler, Gwenyth Allegra, MD 06/06/19 1049    Tegeler, Gwenyth Allegra, MD 06/06/19 1049

## 2019-03-12 NOTE — ED Notes (Signed)
Patient Alert and oriented to baseline. Stable and ambulatory to baseline. Patient verbalized understanding of the discharge instructions.  Patient belongings were taken by the patient.   

## 2019-03-16 DIAGNOSIS — Z7189 Other specified counseling: Secondary | ICD-10-CM | POA: Diagnosis not present

## 2019-03-16 DIAGNOSIS — R5381 Other malaise: Secondary | ICD-10-CM | POA: Diagnosis not present

## 2019-03-16 DIAGNOSIS — R197 Diarrhea, unspecified: Secondary | ICD-10-CM | POA: Diagnosis not present

## 2019-03-16 DIAGNOSIS — U071 COVID-19: Secondary | ICD-10-CM | POA: Diagnosis not present

## 2019-03-26 DIAGNOSIS — F411 Generalized anxiety disorder: Secondary | ICD-10-CM | POA: Diagnosis not present

## 2019-03-26 DIAGNOSIS — F438 Other reactions to severe stress: Secondary | ICD-10-CM | POA: Diagnosis not present

## 2019-04-02 DIAGNOSIS — F411 Generalized anxiety disorder: Secondary | ICD-10-CM | POA: Diagnosis not present

## 2019-04-02 DIAGNOSIS — F438 Other reactions to severe stress: Secondary | ICD-10-CM | POA: Diagnosis not present

## 2019-04-08 ENCOUNTER — Telehealth: Payer: Self-pay | Admitting: Cardiovascular Disease

## 2019-04-08 NOTE — Telephone Encounter (Signed)
Left message for patient to call and schedule appointment with Dr. Sallyanne Kuster

## 2019-04-09 DIAGNOSIS — F411 Generalized anxiety disorder: Secondary | ICD-10-CM | POA: Diagnosis not present

## 2019-04-09 DIAGNOSIS — F438 Other reactions to severe stress: Secondary | ICD-10-CM | POA: Diagnosis not present

## 2019-04-11 ENCOUNTER — Ambulatory Visit: Payer: BC Managed Care – PPO | Attending: Internal Medicine

## 2019-04-11 DIAGNOSIS — Z20828 Contact with and (suspected) exposure to other viral communicable diseases: Secondary | ICD-10-CM | POA: Diagnosis not present

## 2019-04-11 DIAGNOSIS — Z20822 Contact with and (suspected) exposure to covid-19: Secondary | ICD-10-CM

## 2019-04-12 ENCOUNTER — Encounter: Payer: Self-pay | Admitting: Cardiovascular Disease

## 2019-04-12 ENCOUNTER — Telehealth (INDEPENDENT_AMBULATORY_CARE_PROVIDER_SITE_OTHER): Payer: BC Managed Care – PPO | Admitting: Cardiovascular Disease

## 2019-04-12 VITALS — BP 131/78 | HR 87 | Temp 96.9°F | Ht 71.0 in | Wt 250.0 lb

## 2019-04-12 DIAGNOSIS — I5042 Chronic combined systolic (congestive) and diastolic (congestive) heart failure: Secondary | ICD-10-CM | POA: Diagnosis not present

## 2019-04-12 DIAGNOSIS — I2510 Atherosclerotic heart disease of native coronary artery without angina pectoris: Secondary | ICD-10-CM

## 2019-04-12 DIAGNOSIS — I428 Other cardiomyopathies: Secondary | ICD-10-CM

## 2019-04-12 DIAGNOSIS — I447 Left bundle-branch block, unspecified: Secondary | ICD-10-CM | POA: Diagnosis not present

## 2019-04-12 DIAGNOSIS — E668 Other obesity: Secondary | ICD-10-CM | POA: Diagnosis not present

## 2019-04-12 DIAGNOSIS — E7800 Pure hypercholesterolemia, unspecified: Secondary | ICD-10-CM

## 2019-04-12 LAB — NOVEL CORONAVIRUS, NAA: SARS-CoV-2, NAA: NOT DETECTED

## 2019-04-12 NOTE — Progress Notes (Signed)
Virtual Visit via Video Note   This visit type was conducted due to national recommendations for restrictions regarding the COVID-19 Pandemic (e.g. social distancing) in an effort to limit this patient's exposure and mitigate transmission in our community.  Due to his co-morbid illnesses, this patient is at least at moderate risk for complications without adequate follow up.  This format is felt to be most appropriate for this patient at this time.  All issues noted in this document were discussed and addressed.  A limited physical exam was performed with this format.  Please refer to the patient's chart for his consent to telehealth for [facility].   Date:  04/12/2019   ID:  Alejandro Ramos, DOB [birth year], MRN [id number]  Patient Location: Home Provider Location: Office  PCP:  Jonathon Jordan, MD  Cardiologist:  Sanda Klein, MD  Electrophysiologist:  None   Evaluation Performed:  Follow-Up Visit  Chief Complaint:  CHF  History of Present Illness:    Alejandro Ramos is a 63 y.o. 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  He is confident that he had COVID-19 infection.  He was seen in the [facility] emergency room on the night of November 16 for some orthopedic problems and spent about 20 hours there.  A few days later on November 20 he began having problems with headache chills, nausea and subsequent diarrhea.  He told me that initial test for Covid was negative that he was retested yesterday.  He is worried that he might have a lasting myocardial sequelae from the Covid infection.  He has not had problems with shortness of breath at rest or with usual activity, leg edema, dizziness, palpitations or syncope.  He believes that he is in regular rhythm.  He specifically denies orthopnea, PND, leg edema, angina, syncope, palpitations,  dizziness/lightheadedness, focal neurological events or intermittent claudication.    Past Medical History:  Diagnosis Date  . Back pain, chronic 12/03/2011  . CAD (coronary artery disease)   . CHF (congestive heart failure) ([place])   . Chronic prostatitis   . 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 GERD (gastroesophageal reflux disease)   . H/O: drug dependency ([place])   . IBS (irritable bowel syndrome)   . Kidney stone   . LBBB (left bundle branch block)   . OSA (obstructive sleep apnea)   . Sleep apnea 12/03/2011   Has a C-PAP machine, but does not use it.  . 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 CARDIAC CATHETERIZATION N/A 01/12/2016   Procedure: Right/Left Heart Cath and Coronary Angiography;  Surgeon: Sanda Klein, MD; RCA 15%, LM 25-30%, LVEDP 12 mm Hg, PWCP mean 14 mm Hg, PA 35/20, mean 26 mm Hg, CO 4.9 L/min, CI 2 L/min (sq)     Current Meds  Medication Sig  . acetaminophen (TYLENOL) 500 MG tablet Take 1,000 mg by mouth every 6 (six) hours as needed for moderate pain or headache.  . alfuzosin (UROXATRAL) 10 MG 24 hr tablet Take 10 mg by mouth daily.  Marland Kitchen alosetron (LOTRONEX) 0.5 MG tablet Take 0.5 mg by mouth 2 (two) times daily.  . bisoprolol (ZEBETA) 10 MG tablet Take 5 mg by mouth at bedtime.   Marland Kitchen dexlansoprazole (DEXILANT) 60 MG capsule Take 60 mg by mouth daily.  . Diclofenac Sodium CR 100 MG 24 hr  tablet Take 1 tablet (100 mg total) by mouth daily.  . DULoxetine (CYMBALTA) 60 MG capsule Take 60 mg by mouth daily with lunch.   . finasteride (PROSCAR) 5 MG tablet Take 5 mg by mouth daily.   . fluticasone (FLONASE) 50 MCG/ACT nasal spray Place 2 sprays into both nostrils daily.  Marland Kitchen gabapentin (NEURONTIN) 600 MG tablet Take 600 mg by mouth 3 (three) times daily.   Marland Kitchen ibuprofen (ADVIL,MOTRIN) 200 MG tablet Take 800 mg by mouth every 6 (six) hours as needed for moderate pain.  Marland Kitchen loratadine  (CLARITIN) 10 MG tablet Take 1 tablet (10 mg total) by mouth daily.  . Multiple Vitamins-Minerals (MENS 50+ MULTI VITAMIN/MIN PO) Take 1 tablet by mouth daily.  . Omega-3 Fatty Acids (FISH OIL) 1000 MG CAPS Take by mouth.  . ondansetron (ZOFRAN) 4 MG tablet Take 1 tablet (4 mg total) by mouth every 6 (six) hours.  Marland Kitchen oxyCODONE (ROXICODONE) 5 MG immediate release tablet Take 1 tablet (5 mg total) by mouth every 4 (four) hours as needed for up to 15 doses for breakthrough pain.  . polyethylene glycol powder (MIRALAX) 17 GM/SCOOP powder Take 17 g by mouth daily.  . ramelteon (ROZEREM) 8 MG tablet Take 8 mg by mouth at bedtime.  . sacubitril-valsartan (ENTRESTO) 49-51 MG Take 1 tablet by mouth 2 (two) times daily.  Marland Kitchen spironolactone (ALDACTONE) 25 MG tablet TAKE 0.5 TABLETS (12.5 MG TOTAL) BY MOUTH ONCE DAILY. (Patient taking differently: Take 12.5 mg by mouth daily. )  . tiZANidine (ZANAFLEX) 2 MG tablet Take 2 mg by mouth every 8 (eight) hours as needed for muscle spasms.   . triazolam (HALCION) 0.25 MG tablet Take 0.5 mg by mouth at bedtime.   . vitamin E 400 UNIT capsule Take by mouth.     Allergies:   Patient has no known allergies.   Social History   Tobacco Use  . Smoking status: Never Smoker  . Smokeless tobacco: Never Used  Substance Use Topics  . Alcohol use: No  . Drug use: No    Types: Hydrocodone, Other-see comments     Family Hx: The patient's family history includes Arrhythmia in his maternal grandfather and mother; Congestive Heart Failure in his father; Heart attack in his maternal grandmother; Heart attack (age of onset: 27) in his paternal grandfather; Heart attack (age of onset: 7) in his paternal grandmother; Paranoid behavior in his father.  ROS:   Please see the history of present illness.     All other systems reviewed and are negative.   Prior CV studies:   The following studies were reviewed today: Telemedicine visit with Dr. Truett Perna on December 11, 2018  Labs/Other Tests and Data Reviewed:    EKG:  An ECG dated May 04, 2018 was personally reviewed today and demonstrated:  Sinus rhythm, left bundle branch block  Recent Labs: 03/12/2019: ALT 26; BUN 32; Creatinine, Ser 1.02; Hemoglobin 15.6; Platelets 325; Potassium 3.9; Sodium 138   Recent Lipid Panel Lab Results  Component Value Date/Time   CHOL 181 04/28/2016 09:37 AM   TRIG 92 04/28/2016 09:37 AM   HDL 58 04/28/2016 09:37 AM   CHOLHDL 3.1 04/28/2016 09:37 AM   LDLCALC 105 (H) 04/28/2016 09:37 AM    Wt Readings from Last 3 Encounters:  04/12/19 250 lb (113.4 kg)  03/02/19 275 lb (124.7 kg)  02/26/19 265 lb 3.4 oz (120.3 kg)     Objective:    Vital Signs:  BP 131/78   Pulse  87   Temp (!) 96.9 F (36.1 C)   Ht 5\' 11"  (1.803 m)   Wt 250 lb (113.4 kg)   SpO2 95%   BMI 34.87 kg/m    VITAL SIGNS:  reviewed GEN:  no acute distress EYES:  sclerae anicteric, EOMI - Extraocular Movements Intact RESPIRATORY:  normal respiratory effort, symmetric expansion CARDIOVASCULAR:  no peripheral edema SKIN:  no rash, lesions or ulcers. MUSCULOSKELETAL:  no obvious deformities. NEURO:  alert and oriented x 3, no obvious focal deficit PSYCH:  normal affect  ASSESSMENT & PLAN:    1. CHF: Keeping in mind the limitations of a video visit, Mr. Orihuela appears to be euvolemic.  NYHA functional class I.  While I share his concern that he indeed had Covid infection, based on his symptom complex, there is no reason to suspect that there has been exacerbation of heart failure.  We will check an echocardiogram a minimum of 10 days from the PCR test that was sent yesterday.  Tentatively scheduled for the end of December. 2. CMP: Nonischemic.   3. LBBB: CRT could be beneficial if his ejection fraction drops and he becomes more symptomatic. 4. Obesity: Encouraged him to continue efforts at weight loss. 5. CAD: Minor coronary atherosclerosis by previous angiography, target LDL less than  70.  Asymptomatic.   6. HLP: Goal LDL less than 70.   He's always had a very good HDL.  Will be due recheck next month.   COVID-19 Education: The signs and symptoms of COVID-19 were discussed with the patient and how to seek care for testing (follow up with PCP or arrange E-visit).  The importance of social distancing was discussed today.  Time:   Today, I have spent 17 minutes with the patient with telehealth technology discussing the above problems.     Medication Adjustments/Labs and Tests Ordered: Current medicines are reviewed at length with the patient today.  Concerns regarding medicines are outlined above.   Tests Ordered: Orders Placed This Encounter  Procedures  . ECHOCARDIOGRAM COMPLETE    Medication Changes: No orders of the defined types were placed in this encounter.   Follow Up:  Virtual Visit or In Person 6 months  Signed, January, MD  04/12/2019 7:15 PM    [facility]

## 2019-04-12 NOTE — Patient Instructions (Signed)
Medication Instructions:  No changes *If you need a refill on your cardiac medications before your next appointment, please call your pharmacy*  Lab Work: None ordered If you have labs (blood work) drawn today and your tests are completely normal, you will receive your results only by: . MyChart Message (if you have MyChart) OR . A paper copy in the mail If you have any lab test that is abnormal or we need to change your treatment, we will call you to review the results.  Testing/Procedures: Your physician has requested that you have an echocardiogram. Echocardiography is a painless test that uses sound waves to create images of your heart. It provides your doctor with information about the size and shape of your heart and how well your heart's chambers and valves are working. You may receive an ultrasound enhancing agent through an IV if needed to better visualize your heart during the echo.This procedure takes approximately one hour. There are no restrictions for this procedure. This will take place at the [address], [address].    Follow-Up: At [facility], you and your health needs are our priority.  As part of our continuing mission to provide you with exceptional heart care, we have created designated Provider Care Teams.  These Care Teams include your primary Cardiologist (physician) and Advanced Practice Providers (APPs -  Physician Assistants and Nurse Practitioners) who all work together to provide you with the care you need, when you need it.  Your next appointment:   12 months  The format for your next appointment:   In Person  Provider:   Mihai Croitoru, MD   

## 2019-04-16 DIAGNOSIS — F438 Other reactions to severe stress: Secondary | ICD-10-CM | POA: Diagnosis not present

## 2019-04-16 DIAGNOSIS — F411 Generalized anxiety disorder: Secondary | ICD-10-CM | POA: Diagnosis not present

## 2019-04-23 DIAGNOSIS — F438 Other reactions to severe stress: Secondary | ICD-10-CM | POA: Diagnosis not present

## 2019-04-23 DIAGNOSIS — F411 Generalized anxiety disorder: Secondary | ICD-10-CM | POA: Diagnosis not present

## 2019-04-25 ENCOUNTER — Other Ambulatory Visit: Payer: Self-pay

## 2019-04-25 ENCOUNTER — Ambulatory Visit (HOSPITAL_COMMUNITY): Payer: BC Managed Care – PPO | Attending: Cardiovascular Disease

## 2019-04-25 DIAGNOSIS — I5042 Chronic combined systolic (congestive) and diastolic (congestive) heart failure: Secondary | ICD-10-CM | POA: Diagnosis not present

## 2019-05-06 DIAGNOSIS — F3342 Major depressive disorder, recurrent, in full remission: Secondary | ICD-10-CM | POA: Diagnosis not present

## 2019-05-07 DIAGNOSIS — F438 Other reactions to severe stress: Secondary | ICD-10-CM | POA: Diagnosis not present

## 2019-05-07 DIAGNOSIS — F411 Generalized anxiety disorder: Secondary | ICD-10-CM | POA: Diagnosis not present

## 2019-05-14 DIAGNOSIS — F438 Other reactions to severe stress: Secondary | ICD-10-CM | POA: Diagnosis not present

## 2019-05-14 DIAGNOSIS — F411 Generalized anxiety disorder: Secondary | ICD-10-CM | POA: Diagnosis not present

## 2019-05-20 DIAGNOSIS — I5022 Chronic systolic (congestive) heart failure: Secondary | ICD-10-CM | POA: Diagnosis not present

## 2019-05-20 DIAGNOSIS — R0600 Dyspnea, unspecified: Secondary | ICD-10-CM | POA: Diagnosis not present

## 2019-05-20 DIAGNOSIS — I447 Left bundle-branch block, unspecified: Secondary | ICD-10-CM | POA: Diagnosis not present

## 2019-05-21 DIAGNOSIS — F438 Other reactions to severe stress: Secondary | ICD-10-CM | POA: Diagnosis not present

## 2019-05-21 DIAGNOSIS — F411 Generalized anxiety disorder: Secondary | ICD-10-CM | POA: Diagnosis not present

## 2019-07-22 MED ORDER — SPIRONOLACTONE 25 MG PO TABS: ORAL_TABLET | ORAL | 2 refills | Status: DC

## 2019-07-22 MED ORDER — BISOPROLOL FUMARATE 10 MG PO TABS: 5.0000 mg | ORAL_TABLET | Freq: Every day | ORAL | 2 refills | Status: DC

## 2019-10-22 ENCOUNTER — Telehealth: Payer: Self-pay | Admitting: *Deleted

## 2019-10-22 NOTE — Telephone Encounter (Signed)
Left message for patient to call and schedule 3 month virtual visit with Dr. Royann Shivers

## 2019-11-04 ENCOUNTER — Telehealth (INDEPENDENT_AMBULATORY_CARE_PROVIDER_SITE_OTHER): Payer: Medicare Other | Admitting: Physician Assistant

## 2019-11-04 ENCOUNTER — Telehealth: Payer: Self-pay

## 2019-11-04 ENCOUNTER — Encounter: Payer: Self-pay | Admitting: Physician Assistant

## 2019-11-04 VITALS — BP 123/79 | HR 75 | Ht 70.0 in | Wt 245.0 lb

## 2019-11-04 DIAGNOSIS — I5042 Chronic combined systolic (congestive) and diastolic (congestive) heart failure: Secondary | ICD-10-CM | POA: Diagnosis not present

## 2019-11-04 DIAGNOSIS — R7303 Prediabetes: Secondary | ICD-10-CM | POA: Diagnosis not present

## 2019-11-04 DIAGNOSIS — I428 Other cardiomyopathies: Secondary | ICD-10-CM

## 2019-11-04 NOTE — Progress Notes (Signed)
Virtual Visit via Telephone Note   This visit type was conducted due to national recommendations for restrictions regarding the COVID-19 Pandemic (e.g. social distancing) in an effort to limit this patient's exposure and mitigate transmission in our community.  Due to his co-morbid illnesses, this patient is at least at moderate risk for complications without adequate follow up.  This format is felt to be most appropriate for this patient at this time.  The patient did not have access to video technology/had technical difficulties with video requiring transitioning to audio format only (telephone).  All issues noted in this document were discussed and addressed.  No physical exam could be performed with this format.  Please refer to the patient's chart for his  consent to telehealth for [facility].   The patient was identified using 2 identifiers.  Date:  11/05/2019   ID:  Alejandro Ramos, DOB Sep 14, 1955, MRN [id number]  Patient Location: Home Provider Location: Office/Clinic  PCP:  Mila Palmer, MD  Cardiologist:  Thurmon Fair, MD  Electrophysiologist:  None   Evaluation Performed:  Follow-Up Visit  Chief Complaint:  Follow up  History of Present Illness:    Alejandro Ramos is a 64 y.o. male with PMH of NICM, combined systolic and diastolic heart failure, LBBB, obesity, prediabetes and sleep disorder.  He has a history of idiopathic dilated cardiomyopathy.  He is also being followed by Dr. Truett Perna at [facility].  EF was 25 to 30% by echocardiogram in 2017.  No reversible ischemia was seen on the previous Myoview.  He underwent left and right heart cath on 01/11/2018 which showed minimally scattered coronary atherosclerosis and decreased EF by LV gram.  Cardiac index was 2.0.  Cardiac MRI in December 2017 showed improvement to 46%, cardiac index was calculated to 2.2 L/min/m.  Gadolinium enhancement did not show any evidence of infiltrative disease or any scar.  EF has improved to 55 to 60%  by June 2019 with grade 1 DD.  Most recent echocardiogram performed on 04/25/2019 showed EF 50 to 55%, grade 1 DD.  Patient was most recently seen by Dr. Truett Perna virtually on 05/20/2019 at which time he was doing well.   Patient presents today for cardiology virtual visit.  He denies any recent chest pain or shortness of breath.  He has no lower extremity edema, orthopnea or PND.  Patient is fairly independent and able to do every day activity by himself.  He is trying to keep himself isolated.  Patient says he likely has contracted COVID-19 in either November or December of last year.  He did not wait 3 months to get his vaccine.  He received the first dose of Pfizer vaccine in January and his second dose on February 4th.  The patient does not have symptoms concerning for COVID-19 infection (fever, chills, cough, or new shortness of breath).    Past Medical History:  Diagnosis Date   Back pain, chronic 12/03/2011   CAD (coronary artery disease)    CHF (congestive heart failure) ([facility])    Chronic prostatitis    Drug-induced erectile dysfunction 12/03/2011   Induce by antidepressants   GERD (gastroesophageal reflux disease)    H/O: drug dependency ([facility])    IBS (irritable bowel syndrome)    Kidney stone    LBBB (left bundle branch block)    OSA (obstructive sleep apnea)    Sleep apnea 12/03/2011   Has a C-PAP machine, but does not use it.   TMJ syndrome 12/03/2011   Past  Surgical History:  Procedure Laterality Date   CARDIAC CATHETERIZATION N/A 01/12/2016   Procedure: Right/Left Heart Cath and Coronary Angiography;  Surgeon: Thurmon Fair, MD; RCA 15%, LM 25-30%, LVEDP 12 mm Hg, PWCP mean 14 mm Hg, PA 35/20, mean 26 mm Hg, CO 4.9 L/min, CI 2 L/min (sq)     Current Meds  Medication Sig   acetaminophen (TYLENOL) 500 MG tablet Take 1,000 mg by mouth every 6 (six) hours as needed for moderate pain or headache.   bisoprolol (ZEBETA) 10 MG tablet Take 0.5 tablets (5 mg total) by  mouth at bedtime.   DULoxetine (CYMBALTA) 60 MG capsule Take 60 mg by mouth daily with lunch.    finasteride (PROSCAR) 5 MG tablet Take 5 mg by mouth daily.    gabapentin (NEURONTIN) 600 MG tablet Take 600 mg by mouth 3 (three) times daily.    loratadine (CLARITIN) 10 MG tablet Take 1 tablet (10 mg total) by mouth daily.   Multiple Vitamins-Minerals (MENS 50+ MULTI VITAMIN/MIN PO) Take 1 tablet by mouth daily.   Omega-3 Fatty Acids (FISH OIL) 1000 MG CAPS Take by mouth.   ramelteon (ROZEREM) 8 MG tablet Take 8 mg by mouth at bedtime.   sacubitril-valsartan (ENTRESTO) 49-51 MG Take 1 tablet by mouth 2 (two) times daily.   spironolactone (ALDACTONE) 25 MG tablet TAKE 0.5 TABLETS (12.5 MG TOTAL) BY MOUTH ONCE DAILY.   tiZANidine (ZANAFLEX) 2 MG tablet Take 2 mg by mouth every 8 (eight) hours as needed for muscle spasms.    triazolam (HALCION) 0.25 MG tablet Take 0.5 mg by mouth at bedtime.      Allergies:   Patient has no known allergies.   Social History   Tobacco Use   Smoking status: Never Smoker   Smokeless tobacco: Never Used  Vaping Use   Vaping Use: Never used  Substance Use Topics   Alcohol use: No   Drug use: No    Types: Hydrocodone, Other-see comments     Family Hx: The patient's family history includes Arrhythmia in his maternal grandfather and mother; Congestive Heart Failure in his father; Heart attack in his maternal grandmother; Heart attack (age of onset: 70) in his paternal grandfather; Heart attack (age of onset: 48) in his paternal grandmother; Paranoid behavior in his father.  ROS:   Please see the history of present illness.     All other systems reviewed and are negative.   Prior CV studies:   The following studies were reviewed today:  Echo 04/25/2019 IMPRESSIONS    1. Left ventricular ejection fraction, by visual estimation, is 50 to  55%. The left ventricle has normal function. Left ventricular septal wall  thickness was normal.  Mildly increased left ventricular posterior wall  thickness. There is mildly increased  left ventricular hypertrophy.  2. Left ventricular diastolic parameters are consistent with Grade I  diastolic dysfunction (impaired relaxation).  3. The left ventricle demonstrates regional wall motion abnormalities.  4. Global right ventricle has normal systolic function.The right  ventricular size is normal. No increase in right ventricular wall  thickness.  5. Left atrial size was mildly dilated.  6. Right atrial size was normal.  7. The mitral valve is normal in structure. No evidence of mitral valve  regurgitation. No evidence of mitral stenosis.  8. The tricuspid valve is normal in structure.  9. The aortic valve is tricuspid. Aortic valve regurgitation is trivial.  No evidence of aortic valve sclerosis or stenosis.  10. The pulmonic valve was  normal in structure. Pulmonic valve  regurgitation is not visualized.  11. Normal pulmonary artery systolic pressure.  12. The inferior vena cava is normal in size with greater than 50%  respiratory variability, suggesting right atrial pressure of 3 mmHg.   Labs/Other Tests and Data Reviewed:    EKG:  An ECG dated 05/04/2018 was personally reviewed today and demonstrated:  Normal sinus rhythm, left bundle branch block.  Recent Labs: 03/12/2019: ALT 26; BUN 32; Creatinine, Ser 1.02; Hemoglobin 15.6; Platelets 325; Potassium 3.9; Sodium 138   Recent Lipid Panel Lab Results  Component Value Date/Time   CHOL 181 04/28/2016 09:37 AM   TRIG 92 04/28/2016 09:37 AM   HDL 58 04/28/2016 09:37 AM   CHOLHDL 3.1 04/28/2016 09:37 AM   LDLCALC 105 (H) 04/28/2016 09:37 AM    Wt Readings from Last 3 Encounters:  11/04/19 245 lb (111.1 kg)  04/12/19 250 lb (113.4 kg)  03/02/19 275 lb (124.7 kg)     Objective:    Vital Signs:  BP 123/79    Pulse 75    Ht 5\' 10"  (1.778 m)    Wt 245 lb (111.1 kg)    SpO2 97%    BMI 35.15 kg/m    VITAL SIGNS:   reviewed  ASSESSMENT & PLAN:    1. Nonischemic cardiomyopathy: Recent echocardiogram obtained in December 2020 showed normalization of ejection fraction to 50 to 55%.  2. Chronic combined systolic and diastolic heart failure: Euvolemic, denies any recent lower extremity edema, orthopnea or PND.  EF is normal on the recent echocardiogram  3. Prediabetes: Followed by primary care provider  COVID-19 Education: The signs and symptoms of COVID-19 were discussed with the patient and how to seek care for testing (follow up with PCP or arrange E-visit).  The importance of social distancing was discussed today.  Time:   Today, I have spent 9 minutes with the patient with telehealth technology discussing the above problems.     Medication Adjustments/Labs and Tests Ordered: Current medicines are reviewed at length with the patient today.  Concerns regarding medicines are outlined above.   Tests Ordered: No orders of the defined types were placed in this encounter.   Medication Changes: No orders of the defined types were placed in this encounter.   Follow Up:  Either In Person or Virtual in 1 year(s)  Signed, January 2021, PA  11/05/2019 11:44 PM    [facility]

## 2019-11-04 NOTE — Telephone Encounter (Signed)
  Patient Consent for Virtual Visit         Alejandro Ramos has provided verbal consent on 11/04/2019 for a virtual visit (video or telephone).   CONSENT FOR VIRTUAL VISIT FOR:  Alejandro Ramos  By participating in this virtual visit I agree to the following:  I hereby voluntarily request, consent and authorize [facility] and its employed or contracted physicians, physician assistants, nurse practitioners or other licensed health care professionals (the Practitioner), to provide me with telemedicine health care services (the "Services") as deemed necessary by the treating Practitioner. I acknowledge and consent to receive the Services by the Practitioner via telemedicine. I understand that the telemedicine visit will involve communicating with the Practitioner through live audiovisual communication technology and the disclosure of certain medical information by electronic transmission. I acknowledge that I have been given the opportunity to request an in-person assessment or other available alternative prior to the telemedicine visit and am voluntarily participating in the telemedicine visit.  I understand that I have the right to withhold or withdraw my consent to the use of telemedicine in the course of my care at any time, without affecting my right to future care or treatment, and that the Practitioner or I may terminate the telemedicine visit at any time. I understand that I have the right to inspect all information obtained and/or recorded in the course of the telemedicine visit and may receive copies of available information for a reasonable fee.  I understand that some of the potential risks of receiving the Services via telemedicine include:  Marland Kitchen Delay or interruption in medical evaluation due to technological equipment failure or disruption; . Information transmitted may not be sufficient (e.g. poor resolution of images) to allow for appropriate medical decision making by the Practitioner;  and/or  . In rare instances, security protocols could fail, causing a breach of personal health information.  Furthermore, I acknowledge that it is my responsibility to provide information about my medical history, conditions and care that is complete and accurate to the best of my ability. I acknowledge that Practitioner's advice, recommendations, and/or decision may be based on factors not within their control, such as incomplete or inaccurate data provided by me or distortions of diagnostic images or specimens that may result from electronic transmissions. I understand that the practice of medicine is not an exact science and that Practitioner makes no warranties or guarantees regarding treatment outcomes. I acknowledge that a copy of this consent can be made available to me via my patient portal [facility] MyChart), or I can request a printed copy by calling the office of [facility].    I understand that my insurance will be billed for this visit.   I have read or had this consent read to me. . I understand the contents of this consent, which adequately explains the benefits and risks of the Services being provided via telemedicine.  . I have been provided ample opportunity to ask questions regarding this consent and the Services and have had my questions answered to my satisfaction. . I give my informed consent for the services to be provided through the use of telemedicine in my medical care

## 2019-11-04 NOTE — Patient Instructions (Signed)

## 2019-12-31 ENCOUNTER — Ambulatory Visit: Payer: BC Managed Care – PPO | Admitting: Cardiovascular Disease

## 2020-03-26 ENCOUNTER — Other Ambulatory Visit: Payer: Self-pay | Admitting: Cardiovascular Disease

## 2020-03-26 MED ORDER — SPIRONOLACTONE 25 MG PO TABS: ORAL_TABLET | ORAL | 2 refills | Status: DC

## 2020-03-26 NOTE — Telephone Encounter (Signed)
*  STAT* If patient is at the pharmacy, call can be transferred to refill team.   1. Which medications need to be refilled? (please list name of each medication and dose if known)  Spironolactone  2. Which pharmacy/location (including street and city if local pharmacy) is medication to be sent to? [facility] [place] , [place]  3. Do they need a 30 day or 90 day supply? 90 days and refills

## 2020-05-06 ENCOUNTER — Telehealth: Payer: Self-pay | Admitting: Cardiovascular Disease

## 2020-05-06 ENCOUNTER — Other Ambulatory Visit: Payer: Self-pay

## 2020-05-06 MED ORDER — SPIRONOLACTONE 25 MG PO TABS: ORAL_TABLET | ORAL | 2 refills | Status: AC

## 2020-05-06 MED ORDER — BISOPROLOL FUMARATE 10 MG PO TABS: 5.0000 mg | ORAL_TABLET | Freq: Every day | ORAL | 2 refills | Status: DC

## 2020-05-06 MED ORDER — ENTRESTO 49-51 MG PO TABS: 1.0000 | ORAL_TABLET | Freq: Two times a day (BID) | ORAL | 3 refills | Status: AC

## 2020-05-06 NOTE — Telephone Encounter (Signed)
Medications sent for patient

## 2020-05-06 NOTE — Telephone Encounter (Signed)
°*  STAT* If patient is at the pharmacy, call can be transferred to refill team.   1. Which medications need to be refilled? (please list name of each medication and dose if known)  bisoprolol (ZEBETA) 10 MG tablet sacubitril-valsartan (ENTRESTO) 49-51 MG spironolactone (ALDACTONE) 25 MG tablet  2. Which pharmacy/location (including street and city if local pharmacy) is medication to be sent to? [id number] - [place], [place] - [address] AT [facility] OF [address]  3. Do they need a 30 day or 90 day supply? 90   Patient is out of medication

## 2020-06-08 NOTE — Progress Notes (Signed)
Virtual Visit via Telephone Note   This visit type was conducted due to national recommendations for restrictions regarding the COVID-19 Pandemic (e.g. social distancing) in an effort to limit this patient's exposure and mitigate transmission in our community.  Due to his co-morbid illnesses, this patient is at least at moderate risk for complications without adequate follow up.  This format is felt to be most appropriate for this patient at this time.  The patient did not have access to video technology/had technical difficulties with video requiring transitioning to audio format only (telephone).  All issues noted in this document were discussed and addressed.  No physical exam could be performed with this format.  Please refer to the patient's chart for his  consent to telehealth for [facility].  Evaluation Performed:  Follow-up visit  This visit type was conducted due to national recommendations for restrictions regarding the COVID-19 Pandemic (e.g. social distancing).  This format is felt to be most appropriate for this patient at this time.  All issues noted in this document were discussed and addressed.  No physical exam was performed (except for noted visual exam findings with Video Visits).  Please refer to the patient's chart (MyChart message for video visits and phone note for telephone visits) for the patient's consent to telehealth for [facility]  Date:  06/10/2020   ID:  Alejandro Ramos, DOB 1956-03-07, MRN [id number]  Patient Location:  [address] [place] [address]   Provider location:     [facility] [address] Office [phone] Fax [phone]   PCP:  Mila Palmer, MD  Cardiologist:  Thurmon Fair, MD  Electrophysiologist:  None   Chief Complaint: Follow-up for nonischemic cardiomyopathy  History of Present Illness:    Alejandro Ramos is a 65 y.o. male who  presents via audio/video conferencing for a telehealth visit today.  Patient verified DOB and address.  He has a PMH of nonischemic cardiomyopathy, combined systolic and diastolic CHF, left bundle branch block, obesity, prediabetes, and sleep disorder.  He also has a PMH of idiopathic dilated cardiomyopathy and is followed by Dr. Truett Perna at [facility].  In 2017 echocardiogram showed an EF of 25-30%.  No reversible ischemia was seen on his prior nuclear stress test.  He underwent cardiac catheterization 01/11/2018 which showed minimally scattered coronary atherosclerosis with decreased EF.  A cardiac MRI December 2017 showed an improvement in his EF to 46%, cardiac index of 2.2.  Gadolinium enhancement did not show any evidence of infiltrative disease or any scarring.  His EF improved to 55-60% 6/19 with G1 DD.  Echocardiogram 04/25/2019 showed an EF of 50-55% with G1 DD.  He was  seen by Dr. Truett Perna 05/20/2019 and reported doing well at that time.  He was seen virtually in follow-up by Azalee Course PA-C on 11/04/2019.  During that time he denied chest pain shortness of breath.  He denied lower extremity edema, orthopnea and PND.  He reported he remained independent and was able to do all of his daily activities on his own.  He reported he was keeping himself isolated due to the COVID-19 pandemic.  He felt he had contracted COVID-19 and November or December 2020.  He reported he did not wait 3 months to get his vaccine.  He received his first dose of the Pfizer vaccine January 2021 and his second dose February 4.  He is contacted virtually today for follow-up  and states he feels well.  He reports that he has been exercising for about an hour a day walking swimming.  He reports that he had Covid November 2020 and then again Christmas 2021.  He reports he has recovered well.  He does note some GI symptoms which he attributes to his COVID-19 infection.  He follows with GI for this and has been taking omeprazole.  He reports his  meals are prepared for him at his retirement facility.  He reports that he has had recent labs drawn and will try to attach them to his MyChart facility can review them.  He also notes some numbness in his feet.  He is followed by orthopedics and podiatry for this.  I will give him the salty 6 diet sheet, have him maintain his physical activity, and have him follow-up in the office in 6 months.  He asks for refills of his Entresto, spironolactone, and bisoprolol.  Today he denies chest pain, shortness of breath, lower extremity edema, fatigue, palpitations, melena, hematuria, hemoptysis, diaphoresis, weakness, presyncope, syncope, orthopnea, and PND.   The patient does not symptoms concerning for COVID-19 infection (fever, chills, cough, or new SHORTNESS OF BREATH).    Prior CV studies:   The following studies were reviewed today:  Echocardiogram 04/25/2019  IMPRESSIONS    1. Left ventricular ejection fraction, by visual estimation, is 50 to  55%. The left ventricle has normal function. Left ventricular septal wall  thickness was normal. Mildly increased left ventricular posterior wall  thickness. There is mildly increased  left ventricular hypertrophy.  2. Left ventricular diastolic parameters are consistent with Grade I  diastolic dysfunction (impaired relaxation).  3. The left ventricle demonstrates regional wall motion abnormalities.  4. Global right ventricle has normal systolic function.The right  ventricular size is normal. No increase in right ventricular wall  thickness.  5. Left atrial size was mildly dilated.  6. Right atrial size was normal.  7. The mitral valve is normal in structure. No evidence of mitral valve  regurgitation. No evidence of mitral stenosis.  8. The tricuspid valve is normal in structure.  9. The aortic valve is tricuspid. Aortic valve regurgitation is trivial.  No evidence of aortic valve sclerosis or stenosis.  10. The pulmonic valve was  normal in structure. Pulmonic valve  regurgitation is not visualized.  11. Normal pulmonary artery systolic pressure.  12. The inferior vena cava is normal in size with greater than 50%  respiratory variability, suggesting right atrial pressure of 3 mmHg.  Past Medical History:  Diagnosis Date  . Back pain, chronic 12/03/2011  . CAD (coronary artery disease)   . CHF (congestive heart failure) (HCC)   . Chronic prostatitis   . 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 GERD (gastroesophageal reflux disease)   . H/O: drug dependency (HCC)   . IBS (irritable bowel syndrome)   . Kidney stone   . LBBB (left bundle branch block)   . OSA (obstructive sleep apnea)   . Sleep apnea 12/03/2011   Has a C-PAP machine, but does not use it.  . 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 CARDIAC CATHETERIZATION N/A 01/12/2016   Procedure: Right/Left Heart Cath and Coronary Angiography;  Surgeon: Thurmon Fair, MD; RCA 15%, LM 25-30%, LVEDP 12 mm Hg, PWCP mean 14 mm Hg, PA 35/20, mean 26 mm Hg, CO 4.9 L/min, CI 2 L/min (sq)     Current Meds  Medication Sig  . acetaminophen (TYLENOL) 500 MG tablet Take 1,000 mg by mouth every 6 (six) hours as needed for moderate pain or headache.  . DULoxetine (CYMBALTA) 60 MG capsule Take 60 mg by mouth daily with lunch.   . finasteride (PROSCAR) 5 MG tablet Take 5 mg by mouth daily.   Marland Kitchen gabapentin (NEURONTIN) 600 MG tablet Take 600 mg by mouth 3 (three) times daily.   Marland Kitchen loratadine (CLARITIN) 10 MG tablet Take 1 tablet (10 mg total) by mouth daily.  . Multiple Vitamins-Minerals (MENS 50+ MULTI VITAMIN/MIN PO) Take 1 tablet by mouth daily.  . sacubitril-valsartan (ENTRESTO) 49-51 MG Take 1 tablet by mouth 2 (two) times daily.  Marland Kitchen spironolactone (ALDACTONE) 25 MG tablet TAKE 0.5 TABLETS (12.5 MG TOTAL) BY MOUTH ONCE DAILY.  Marland Kitchen triazolam (HALCION) 0.25 MG tablet Take 0.5 mg by mouth at bedtime.    . [DISCONTINUED] bisoprolol (ZEBETA) 10 MG tablet Take 0.5 tablets (5 mg total) by mouth at bedtime.  . [DISCONTINUED] tiZANidine (ZANAFLEX) 2 MG tablet Take 2 mg by mouth every 8 (eight) hours as needed for muscle spasms.      Allergies:   Patient has no known allergies.   Social History   Tobacco Use  . Smoking status: Never Smoker  . Smokeless tobacco: Never Used  Vaping Use  . Vaping Use: Never used  Substance Use Topics  . Alcohol use: No  . Drug use: No    Types: Hydrocodone, Other-see comments     Family Hx: The patient's family history includes Arrhythmia in his maternal grandfather and mother; Congestive Heart Failure in his father; Heart attack in his maternal grandmother; Heart attack (age of onset: 59) in his paternal grandfather; Heart attack (age of onset: 59) in his paternal grandmother; Paranoid behavior in his father.  ROS:   Please see the history of present illness.     All other systems reviewed and are negative.   Labs/Other Tests and Data Reviewed:    Recent Labs: No results found for requested labs within last 8760 hours.   Recent Lipid Panel Lab Results  Component Value Date/Time   CHOL 181 04/28/2016 09:37 AM   TRIG 92 04/28/2016 09:37 AM   HDL 58 04/28/2016 09:37 AM   CHOLHDL 3.1 04/28/2016 09:37 AM   LDLCALC 105 (H) 04/28/2016 09:37 AM    Wt Readings from Last 3 Encounters:  06/10/20 252 lb (114.3 kg)  11/04/19 245 lb (111.1 kg)  04/12/19 250 lb (113.4 kg)     Exam:    Vital Signs:  BP 118/77   Pulse 76   Wt 252 lb (114.3 kg)   SpO2 96%   BMI 36.16 kg/m    Well nourished, well developed male in no  acute distress.   ASSESSMENT & PLAN:    1.  Nonischemic cardiomyopathy-no increased DOE or activity intolerance.  Echocardiogram 12/20 showed an EF of 50-55%. Continue bisoprolol, Entresto, spironolactone Heart healthy low-sodium diet-salty 6 given Increase physical activity as tolerated Order BMP  Combined systolic and  diastolic CHF-euvolemic today.  Weight stable.  Has maintained his physical activity. Continue Entresto, spironolactone, bisoprolol Heart healthy low-sodium diet-salty 6 given Increase physical activity as tolerated  Prediabetes- A1c 5.5 2/22 Heart healthy low-sodium carbohydrate modified diet Increase physical activity as tolerated Follows with PCP   COVID-19 Education: The signs and symptoms of COVID-19 were discussed with the patient and how to seek care for testing (follow up with PCP or arrange E-visit).  The importance of social  distancing was discussed today.  Patient Risk:   After full review of this patients clinical status, I feel that they are at least moderate risk at this time.  Time:   Today, I have spent 12 minutes with the patient with telehealth technology discussing medication, diet, .     Medication Adjustments/Labs and Tests Ordered: Current medicines are reviewed at length with the patient today.  Concerns regarding medicines are outlined above.   Tests Ordered: No orders of the defined types were placed in this encounter.  Medication Changes: Meds ordered this encounter  Medications  . bisoprolol (ZEBETA) 10 MG tablet    Sig: Take 0.5 tablets (5 mg total) by mouth at bedtime.    Dispense:  45 tablet    Refill:  2  . tiZANidine (ZANAFLEX) 2 MG tablet    Sig: Take 1 tablet (2 mg total) by mouth every 8 (eight) hours as needed for muscle spasms.    Dispense:  30 tablet    Refill:  6    Disposition:  in 6 month(s)  Signed, Thomasene Ripple. Verlin Uher NP-C    11/27/2018 11:58 AM    [facility] [address] Office [phone] Fax [phone]

## 2020-06-10 ENCOUNTER — Encounter: Payer: Self-pay | Admitting: General Practice

## 2020-06-10 ENCOUNTER — Telehealth (INDEPENDENT_AMBULATORY_CARE_PROVIDER_SITE_OTHER): Payer: Medicare Other | Admitting: General Practice

## 2020-06-10 VITALS — BP 118/77 | HR 76 | Wt 252.0 lb

## 2020-06-10 DIAGNOSIS — I428 Other cardiomyopathies: Secondary | ICD-10-CM

## 2020-06-10 DIAGNOSIS — R7303 Prediabetes: Secondary | ICD-10-CM

## 2020-06-10 DIAGNOSIS — I5042 Chronic combined systolic (congestive) and diastolic (congestive) heart failure: Secondary | ICD-10-CM

## 2020-06-10 DIAGNOSIS — Z7189 Other specified counseling: Secondary | ICD-10-CM | POA: Diagnosis not present

## 2020-06-10 MED ORDER — TIZANIDINE HCL 2 MG PO TABS: 2.0000 mg | ORAL_TABLET | Freq: Three times a day (TID) | ORAL | 6 refills | Status: AC | PRN

## 2020-06-10 MED ORDER — BISOPROLOL FUMARATE 10 MG PO TABS: 5.0000 mg | ORAL_TABLET | Freq: Every day | ORAL | 2 refills | Status: AC

## 2020-06-10 NOTE — Progress Notes (Signed)
Thanks

## 2020-06-10 NOTE — Patient Instructions (Signed)
Medication Instructions:  The current medical regimen is effective;  continue present plan and medications as directed. Please refer to the Current Medication list given to you today.  *If you need a refill on your cardiac medications before your next appointment, please call your pharmacy*  Lab Work:   Testing/Procedures:  NONE    NONE  Special Instructions PLEASE READ AND FOLLOW SALTY 6-ATTACHED-1,800mg  daily  PLEASE MAINTAIN PHYSICAL ACTIVITY AS TOLERATED  Follow-Up: Your next appointment:  6 month(s) In Person with Thurmon Fair, MD OR IF UNAVAILABLE HAO MENG, PA-C  Please call our office 2 months in advance to schedule this appointment   At [facility], you and your health needs are our priority.  As part of our continuing mission to provide you with exceptional heart care, we have created designated Provider Care Teams.  These Care Teams include your primary Cardiologist (physician) and Advanced Practice Providers (APPs -  Physician Assistants and Nurse Practitioners) who all work together to provide you with the care you need, when you need it.            6 SALTY THINGS TO AVOID     1,800MG  DAILY
